# Patient Record
Sex: Female | Born: 1945
Health system: Southern US, Community
[De-identification: ages and names within clinical notes are randomized; demographics above are authoritative.]

## PROBLEM LIST (undated history)

## (undated) DIAGNOSIS — J189 Pneumonia, unspecified organism: Secondary | ICD-10-CM

## (undated) DIAGNOSIS — Z85828 Personal history of other malignant neoplasm of skin: Secondary | ICD-10-CM

## (undated) DIAGNOSIS — R7303 Prediabetes: Secondary | ICD-10-CM

## (undated) DIAGNOSIS — I499 Cardiac arrhythmia, unspecified: Secondary | ICD-10-CM

## (undated) DIAGNOSIS — N189 Chronic kidney disease, unspecified: Secondary | ICD-10-CM

## (undated) DIAGNOSIS — M1990 Unspecified osteoarthritis, unspecified site: Secondary | ICD-10-CM

## (undated) DIAGNOSIS — F329 Major depressive disorder, single episode, unspecified: Secondary | ICD-10-CM

## (undated) DIAGNOSIS — R21 Rash and other nonspecific skin eruption: Secondary | ICD-10-CM

## (undated) DIAGNOSIS — G4730 Sleep apnea, unspecified: Secondary | ICD-10-CM

## (undated) DIAGNOSIS — F419 Anxiety disorder, unspecified: Secondary | ICD-10-CM

## (undated) DIAGNOSIS — F32A Depression, unspecified: Secondary | ICD-10-CM

## (undated) DIAGNOSIS — I10 Essential (primary) hypertension: Secondary | ICD-10-CM

## (undated) DIAGNOSIS — Z9289 Personal history of other medical treatment: Secondary | ICD-10-CM

---

## 1998-09-17 ENCOUNTER — Other Ambulatory Visit: Admission: RE | Admit: 1998-09-17 | Discharge: 1998-09-17 | Payer: Self-pay | Admitting: Obstetrics and Gynecology

## 1998-11-30 ENCOUNTER — Ambulatory Visit: Admission: RE | Admit: 1998-11-30 | Discharge: 1998-11-30 | Payer: Self-pay | Admitting: Family Medicine

## 1999-02-06 ENCOUNTER — Ambulatory Visit: Admission: RE | Admit: 1999-02-06 | Discharge: 1999-02-06 | Payer: Self-pay | Admitting: Family Medicine

## 1999-09-17 ENCOUNTER — Ambulatory Visit (HOSPITAL_COMMUNITY): Admission: RE | Admit: 1999-09-17 | Discharge: 1999-09-17 | Payer: Self-pay | Admitting: Obstetrics and Gynecology

## 1999-09-17 ENCOUNTER — Encounter: Payer: Self-pay | Admitting: Obstetrics and Gynecology

## 1999-09-20 ENCOUNTER — Ambulatory Visit (HOSPITAL_COMMUNITY): Admission: RE | Admit: 1999-09-20 | Discharge: 1999-09-20 | Payer: Self-pay | Admitting: *Deleted

## 1999-09-20 ENCOUNTER — Encounter: Payer: Self-pay | Admitting: *Deleted

## 1999-10-22 ENCOUNTER — Other Ambulatory Visit: Admission: RE | Admit: 1999-10-22 | Discharge: 1999-10-22 | Payer: Self-pay | Admitting: Obstetrics and Gynecology

## 2000-10-22 ENCOUNTER — Ambulatory Visit (HOSPITAL_COMMUNITY): Admission: RE | Admit: 2000-10-22 | Discharge: 2000-10-22 | Payer: Self-pay | Admitting: Obstetrics and Gynecology

## 2000-10-22 ENCOUNTER — Encounter: Payer: Self-pay | Admitting: Obstetrics and Gynecology

## 2000-10-29 ENCOUNTER — Other Ambulatory Visit: Admission: RE | Admit: 2000-10-29 | Discharge: 2000-10-29 | Payer: Self-pay | Admitting: Obstetrics and Gynecology

## 2000-10-29 ENCOUNTER — Encounter (INDEPENDENT_AMBULATORY_CARE_PROVIDER_SITE_OTHER): Payer: Self-pay

## 2001-02-03 ENCOUNTER — Encounter (INDEPENDENT_AMBULATORY_CARE_PROVIDER_SITE_OTHER): Payer: Self-pay | Admitting: Specialist

## 2001-02-03 ENCOUNTER — Ambulatory Visit (HOSPITAL_COMMUNITY): Admission: RE | Admit: 2001-02-03 | Discharge: 2001-02-03 | Payer: Self-pay | Admitting: Obstetrics and Gynecology

## 2001-03-16 ENCOUNTER — Encounter: Payer: Self-pay | Admitting: Family Medicine

## 2001-03-16 ENCOUNTER — Ambulatory Visit (HOSPITAL_COMMUNITY): Admission: RE | Admit: 2001-03-16 | Discharge: 2001-03-16 | Payer: Self-pay | Admitting: Family Medicine

## 2001-03-29 ENCOUNTER — Ambulatory Visit (HOSPITAL_COMMUNITY): Admission: RE | Admit: 2001-03-29 | Discharge: 2001-03-29 | Payer: Self-pay | Admitting: Gastroenterology

## 2001-04-01 ENCOUNTER — Encounter: Payer: Self-pay | Admitting: Obstetrics and Gynecology

## 2001-04-01 ENCOUNTER — Ambulatory Visit (HOSPITAL_COMMUNITY): Admission: RE | Admit: 2001-04-01 | Discharge: 2001-04-01 | Payer: Self-pay | Admitting: Obstetrics and Gynecology

## 2001-10-16 ENCOUNTER — Encounter: Payer: Self-pay | Admitting: Internal Medicine

## 2001-10-16 ENCOUNTER — Emergency Department (HOSPITAL_COMMUNITY): Admission: EM | Admit: 2001-10-16 | Discharge: 2001-10-16 | Payer: Self-pay | Admitting: Emergency Medicine

## 2002-01-05 ENCOUNTER — Inpatient Hospital Stay (HOSPITAL_COMMUNITY): Admission: EM | Admit: 2002-01-05 | Discharge: 2002-01-07 | Payer: Self-pay

## 2002-01-06 ENCOUNTER — Encounter: Payer: Self-pay | Admitting: Urology

## 2002-01-11 ENCOUNTER — Encounter: Payer: Self-pay | Admitting: Urology

## 2002-01-11 ENCOUNTER — Ambulatory Visit (HOSPITAL_COMMUNITY): Admission: RE | Admit: 2002-01-11 | Discharge: 2002-01-11 | Payer: Self-pay | Admitting: Urology

## 2002-02-07 ENCOUNTER — Encounter: Payer: Self-pay | Admitting: Obstetrics and Gynecology

## 2002-02-07 ENCOUNTER — Encounter: Admission: RE | Admit: 2002-02-07 | Discharge: 2002-02-07 | Payer: Self-pay | Admitting: Obstetrics and Gynecology

## 2002-03-17 ENCOUNTER — Encounter: Payer: Self-pay | Admitting: Urology

## 2002-03-17 ENCOUNTER — Ambulatory Visit (HOSPITAL_BASED_OUTPATIENT_CLINIC_OR_DEPARTMENT_OTHER): Admission: RE | Admit: 2002-03-17 | Discharge: 2002-03-17 | Payer: Self-pay | Admitting: Urology

## 2002-03-28 ENCOUNTER — Ambulatory Visit (HOSPITAL_BASED_OUTPATIENT_CLINIC_OR_DEPARTMENT_OTHER): Admission: RE | Admit: 2002-03-28 | Discharge: 2002-03-28 | Payer: Self-pay | Admitting: Urology

## 2003-01-02 ENCOUNTER — Encounter: Payer: Self-pay | Admitting: Urology

## 2003-01-02 ENCOUNTER — Ambulatory Visit (HOSPITAL_BASED_OUTPATIENT_CLINIC_OR_DEPARTMENT_OTHER): Admission: RE | Admit: 2003-01-02 | Discharge: 2003-01-02 | Payer: Self-pay | Admitting: Urology

## 2004-02-05 ENCOUNTER — Ambulatory Visit (HOSPITAL_COMMUNITY): Admission: RE | Admit: 2004-02-05 | Discharge: 2004-02-05 | Payer: Self-pay | Admitting: Obstetrics and Gynecology

## 2004-02-08 ENCOUNTER — Other Ambulatory Visit: Admission: RE | Admit: 2004-02-08 | Discharge: 2004-02-08 | Payer: Self-pay | Admitting: Obstetrics and Gynecology

## 2004-08-25 DIAGNOSIS — I499 Cardiac arrhythmia, unspecified: Secondary | ICD-10-CM

## 2004-08-25 HISTORY — DX: Cardiac arrhythmia, unspecified: I49.9

## 2005-03-04 ENCOUNTER — Other Ambulatory Visit: Admission: RE | Admit: 2005-03-04 | Discharge: 2005-03-04 | Payer: Self-pay | Admitting: Obstetrics and Gynecology

## 2005-03-07 ENCOUNTER — Emergency Department (HOSPITAL_COMMUNITY): Admission: EM | Admit: 2005-03-07 | Discharge: 2005-03-07 | Payer: Self-pay | Admitting: Emergency Medicine

## 2005-03-17 ENCOUNTER — Ambulatory Visit (HOSPITAL_COMMUNITY): Admission: RE | Admit: 2005-03-17 | Discharge: 2005-03-17 | Payer: Self-pay | Admitting: Obstetrics and Gynecology

## 2006-03-25 ENCOUNTER — Other Ambulatory Visit: Admission: RE | Admit: 2006-03-25 | Discharge: 2006-03-25 | Payer: Self-pay | Admitting: Obstetrics and Gynecology

## 2007-04-21 ENCOUNTER — Ambulatory Visit (HOSPITAL_COMMUNITY): Admission: RE | Admit: 2007-04-21 | Discharge: 2007-04-21 | Payer: Self-pay | Admitting: Obstetrics and Gynecology

## 2007-04-30 ENCOUNTER — Other Ambulatory Visit: Admission: RE | Admit: 2007-04-30 | Discharge: 2007-04-30 | Payer: Self-pay | Admitting: Obstetrics and Gynecology

## 2008-05-03 ENCOUNTER — Other Ambulatory Visit: Admission: RE | Admit: 2008-05-03 | Discharge: 2008-05-03 | Payer: Self-pay | Admitting: Obstetrics and Gynecology

## 2009-05-23 ENCOUNTER — Other Ambulatory Visit: Admission: RE | Admit: 2009-05-23 | Discharge: 2009-05-23 | Payer: Self-pay | Admitting: Obstetrics and Gynecology

## 2009-05-23 ENCOUNTER — Ambulatory Visit (HOSPITAL_COMMUNITY): Admission: RE | Admit: 2009-05-23 | Discharge: 2009-05-23 | Payer: Self-pay | Admitting: Obstetrics and Gynecology

## 2010-06-25 ENCOUNTER — Ambulatory Visit (HOSPITAL_COMMUNITY): Admission: RE | Admit: 2010-06-25 | Discharge: 2010-06-25 | Payer: Self-pay | Admitting: Obstetrics and Gynecology

## 2011-01-10 NOTE — Op Note (Signed)
[facility]  Patient:    Shannon Aguilar, Shannon Aguilar                     MRN: [id number] Adm. Date:  [id number] Attending:  Madelyn Flavors [facility] [address], [place]   Operative Report  PREOPERATIVE DIAGNOSES:       1. Menorrhagia (626.0).                               2. Endometrial polyp (621.0).  POSTOPERATIVE DIAGNOSES:      1. Menorrhagia (626.0).                               2. Endometrial polyp (621.0).                               3. Small uterine septum.  PROCEDURE:                    Dilatation and curettage, hysteroscopy.  SURGEON:                      Beather Arbour. Thomasena Edis, M.D.  ANESTHESIA:                   Monitored anesthesia care plus 10 cc of 1% lidocaine paracervical block.  ESTIMATED BLOOD LOSS:         Minimal.  FLUIDS:                       Approximately 1000 cc of crystalloid.  DRAINS:                       None.  COMPLICATIONS:                None.  FINDINGS:                     Small uterine septum noted at the fundal portion in the midline.  DESCRIPTION OF OPERATION:     The patient was brought to the operating room and identified on the operating room table.  After the patient was adequately sedated, she was placed in the dorsal lithotomy position, prepped and draped in the usual sterile fashion.  The bladder was straight catheterized for approximately 100 cc of clear yellow urine.  Examination under anesthesia revealed the uterus to be approximately eight weeks in size and anteverted without any adnexal mass palpated.  A speculum was placed.  the anterior lip of the cervix was infiltrated with 1 cc of 1% lidocaine and grasped with a single-tooth tenaculum.  The remaining 9 cc of 1% lidocaine was placed for paracervical block.  The cervix was noted to be scarred due to its parous nature.  The cervix was very carefully and gently dilated up to a #25 Pratt dilator.  Dilatation proceeded  smoothly and easily, but was don very gently to decrease the risk of uterine perforation.  Using Sorbitol as a distending medium, the [facility] hysteroscope was placed.  a very careful and thorough hysteroscopic examination was performed.  The uterus has previously sounded to approximately 7 cm.  There was noted to be polypoid tissue.  There was  also noted to be a uterine septum at the fundus which was small in nature. However, it did seem to emanate equidistant between both borders of the uterus.  After careful and thorough hysteroscopic examination was performed, curettage was performed in a systematic clockwise fashion.  The small curet was used to gently curet the uterus.  I was able to curet around the septum, both left and right.  Great care was taken to decrease the risk of uterine perforation.  Due to the septum, I was unable to place the hysteroscope such that the tubal ostia could be visualized.  Again, this was done very carefully to decrease the risk of uterine perforation.  The Randall stone forceps were placed and additional tissue was obtained.  The hysteroscope was placed and the entire uterus was noted to be well curetted.  At that point, the procedure was terminated.  The patient tolerated the procedure well without apparent complications and was transferred to the recovery room in stable condition after sponge, needle and instrument counts were correct.  It should be noted that there was no bleeding from the tenaculum site.  The patient received the postoperative instruction sheet and urged to return to the office in two weeks for postoperative examination.  She was urged to make no financial or major decisions for the next 24 hours.  She was instructed that she could use ibuprofen 600 mg q.6h. p.r.n. pain.  She is to call for any problems, fever or heavy bleeding. DD:  02/03/01 TD:  02/03/01 Job: [id number]

## 2011-01-10 NOTE — H&P (Signed)
[facility]  Patient:    Shannon Aguilar, Shannon Aguilar Visit Number: [id number] MRN: [id number]          Service Type: MED Location: 3W 0380 01 Attending Physician:  Lauree Chandler Dictated by:   Maretta Bees. Vonita Moss, M.D. Admit Date:  01/05/2002                           History and Physical  HISTORY OF PRESENT ILLNESS:  This 65 year old white female had the onset yesterday afternoon of severe right flank pain.  She was seen in the emergency room, and a CT scan showed a right renal calculus that was nonobstructing, but right hydronephrosis and a 5 x 7 calcification in the vicinity of the distal ureter and bladder but not definitely within either entity.  Because of the severe of the pain despite narcotics I was consulted, and she was admitted to the hospital.  She had a stone several years ago.  She has never had surgery for stones.  She has had history of recurrent UTIs but is not currently having any voiding dysfunction.  PAST MEDICAL HISTORY: 1. Mild hypertension. 2. Sleep apnea. 3. Arthritis. 4. Depression.  PAST SURGICAL HISTORY:  Two C-sections.  MEDICATIONS: 1. Zoloft 100 mg a day. 2. Multivitamin. 3. Allegra for allergies. 4. She thinks her blood pressure pill is triamterene.  ALLERGIES:  Denied.  SOCIAL HISTORY:  She does not smoke, does not drink alcohol.  FAMILY HISTORY:  Noncontributory.  REVIEW OF SYSTEMS:  Noted on health history form.  PHYSICAL EXAMINATION:  VITAL SIGNS:  Blood pressure 160/77, pulse 84, respiratory rate 16.  GENERAL:  Alert and oriented but in distress.  SKIN:  Warm and dry.  NECK:  Supple.  LUNGS:  No respiratory distress.  HEART:  Heart tones regular.  ABDOMEN:  Soft and nontender.  IMPRESSION: 1. Right hydronephrosis and right renal calculus and question of a distal    right ureteral calculus. 2. Hypertension. 3. Depression. 4. Arthritis.  PLAN:  Because of continued pain and question of  whether it is obstructing her ureter, will go ahead today with cystoscopy and retrograde pyelogram, ureteroscopy, etc. Dictated by:   Maretta Bees. Vonita Moss, M.D. Attending Physician:  Lauree Chandler DD:  01/06/02 TD:  01/07/02 Job: [id number]

## 2011-01-10 NOTE — Procedures (Signed)
[facility]  Patient:    RAYLAN, TROIANI                       MRN: [id number] Proc. Date: 03/29/01 Attending:  Verlin Grills, M.D. CC:         Meredith Staggers, M.D.  Beather Arbour Thomasena Edis, M.D.   Procedure Report  PROCEDURE:  Surveillance colonoscopy.  PROCEDURE INDICATION:  Ms. Faithann Natal (date of birth [birth year]) is a 65 year old female who is due for her first surveillance colonoscopy with polypectomy to prevent colon cancer.  ENDOSCOPIST:  Verlin Grills, M.D.  PREMEDICATION:  Versed 7 mg, Demerol 50 mg.  ENDOSCOPE:  Olympus pediatric colonoscope.  DESCRIPTION OF PROCEDURE:  After obtaining informed consent, Ms. Cranfield was placed in the left lateral decubitus position.  I administered intravenous Demerol and intravenous Versed to achieve conscious sedation for the procedure.  The patients blood pressure, oxygen saturations, and cardiac rhythm were monitored throughout the procedure and documented in the medical record.  Anal inspection was normal.  Digital rectal exam was normal.  The Olympus pediatric video colonoscope was introduced into the rectum and advanced to the cecum.  Colonic preparation for the exam today was satisfactory.  RECTUM:  Normal.  SIGMOID COLON AND DESCENDING COLON:  Normal.  SPLENIC FLEXURE:  Normal.  TRANSVERSE COLON:  Normal.  HEPATIC FLEXURE:  Normal.  ASCENDING COLON:  Normal.  CECUM AND ILEOCECAL VALVE:  Normal.  ASSESSMENT:  Normal proctocolonoscopy to the cecum. DD:  03/29/01 TD:  03/29/01 Job: [id number]

## 2011-01-10 NOTE — Op Note (Signed)
[facility]  Patient:    Shannon Aguilar, Shannon Aguilar Visit Number: [id number] MRN: [id number]          Service Type: MED Location: 3W 0380 01 Attending Physician:  Lauree Chandler Dictated by:   Maretta Bees. Vonita Moss, M.D. Proc. Date: 01/06/02 Admit Date:  01/05/2002 Discharge Date: 01/07/2002                             Operative Report  PREOPERATIVE DIAGNOSIS:  Distal right ureteral calculus.  POSTOPERATIVE DIAGNOSIS:  Distal right ureteral calculus.  PROCEDURE: 1. Cystoscopy. 2. Right retrograde pyelogram with interpretation. 3. Right ureteroscopy and stone-basketing. 4. Instructed of right double-J catheter.  SURGEON:  Maretta Bees. Vonita Moss, M.D.  ANESTHESIA:  General.  INDICATIONS:  This 65 year old lady has had stone in the past and was admitted at this time with severe right flank pain.  It has persisted overnight.  The CT scan showed bilateral nonobstructing renal calculi and right hydronephrosis, and a 6 mm calcification near the distal ureter and bladder but questionably outside those structures.  It was a persistent pain and obstruction on CT.  She is brought to the OR today.  DESCRIPTION OF PROCEDURE:  The patient was brought to the operating room and placed in lithotomy position.  External genitalia were prepped and draped in the usual fashion.  She was cystoscoped, and the right intramural ureters appeared somewhat bulging compared to the left.  There were no stones or other lesions in the bladder.  I then tried to pass a guidewire up the right ureter, but it would not go beyond the intramural ureter.  I then injected contrast, and it revealed the dilated ureter right down to the outside of the bladder. With an open-ended catheter inside the orifice, I was able to get up the Glidewire into the ureter and kidney and then replaced it by means of an open-ended ureteral catheter with a metal guidewire.  I then inserted the 6 French ureteroscope  in the intramural ureter and just as it exited the bladder, there was a strictured area that I was able to dilate.  Just beyond this area was a small, crumbly, yellow stone that broke up into tiny, small fragments with the stone basket.  The stone was barely just 2-3 mm in size.  I then scoped the distal ureter, and there were no residual stone fragments. There was no significant trauma to the distal ureter.  Reinjection of contrast revealed no extravasation.  With the guidewire in place, I inserted a 6 French 26 cm double-J catheter that had a full coil in the bladder and full coil in the kidney.  The string was brought out per urethra.  I left the double-J catheter in at this time because she needs to travel out of town for a childs graduation, and I wanted her to have obstruction relieved during that time. The bladder was emptied, the cystoscope removed, and the patient sent to the recovery room in good condition. Dictated by:   Maretta Bees. Vonita Moss, M.D. Attending Physician:  Lauree Chandler DD:  01/06/02 TD:  01/08/02 Job: [id number]

## 2011-01-10 NOTE — H&P (Signed)
[facility]  Patient:    Shannon Aguilar, Shannon Aguilar                       MRN: [id number] Adm. Date:  02/03/01 Attending:  Beather Arbour. Thomasena Edis, M.D.                         History and Physical  DATE OF BIRTH:                1945-10-10  HISTORY OF PRESENT ILLNESS:   Patient is a 65 year old Caucasian female who recently was found to have significant menorrhagia.  She was given Provera to treat her bleeding.  She subsequently underwent an ultrasound which showed an endometrial stripe which was thickened, measuring up to 8.4 mm.  An endometrial biopsy was performed and noted to be secretory but with partial hemorrhagic infarction of a polyp.  Patient is admitted for a D&C/hysteroscopy to rule out additional polyp and to rule out any coexisting endometrial hyperplasia.  Patient understands this is highly unlikely in view of secretory endometrium, however, this needs to be performed due to the history of the thickened endometrium.  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  Risks of surgery including anesthetic complications, hemorrhage or infection, damage to adjacent structures including bladder, bowel, blood vessels and ureters were discussed with patient.  She was made aware of uterine perforation which could result in overwhelming life-threatening hemorrhage requiring emergent hysterectomy and uterine perforation which could result in bowel damage requiring emergent colostomy or which would result in overwhelming life-threatening peritonitis.  She expresses understanding of and acceptance of these risks and desires to proceed.  PAST MEDICAL HISTORY:         Hypertension, seasonal allergies, anxiety, history of kidney stones in May of 1980 and pericarditis, September of 1990.  ALLERGIES:                    No known drug allergies.  SOCIAL HISTORY:                The patient is a Runner, broadcasting/film/video at [employer].  REVIEW OF SYSTEMS:            Negative.  FAMILY HISTORY:               The patients father died of heart disease, diabetes and prostate cancer.  The patients mother has hypertension and arthritis.  She has two brothers, ages 56 and 59, both with hypertension.  She has one sister, age 66, with hypothyroidism and hypercholesterolemia.  Her two children have no medical problems.  MEDICATIONS:                  1. Allegra one daily.                               2. Triamterene one-half tablet daily for blood                                  pressure.  3. Zoloft 100 mg daily for anxiety.  PHYSICAL EXAMINATION:  VITAL SIGNS:                  Height 65 inches.  Weight 205 pounds.  Blood pressure 126/84.  HEENT:                        Normal.  NECK:                         Supple without thyromegaly or adenopathy.  LUNGS:                        Clear to auscultation.  CARDIAC:                      Regular rate and rhythm.  ABDOMEN:                      Soft, nontender.  No hepatosplenomegaly, obese.  PELVIC:                       BUS normal.  Cervix, vagina and vulva without visible lesions.  Bimanual examination reveals the uterus to be nontender, approximately 10 weeks size, without any adnexal mass palpated.  IMPRESSION AND PLAN:          The patient is a 65 year old gravida 2, para 2 Caucasian female with menorrhagia, well-controlled on Provera at the time of the bleeding.  She is admitted for a dilatation and curettage and hysteroscopy to rule out any additional polyp after endometrial biopsy showed degenerating secretory endometrium and a benign endometrial polyp with partial hemorrhagic infarction.  Risks have been explained to the patient.  She expresses understanding of and acceptance of these risks. DD:  02/03/01 TD:  02/03/01 Job: [id number]

## 2011-10-29 ENCOUNTER — Other Ambulatory Visit (HOSPITAL_COMMUNITY): Payer: Self-pay | Admitting: Obstetrics and Gynecology

## 2011-10-29 DIAGNOSIS — Z1231 Encounter for screening mammogram for malignant neoplasm of breast: Secondary | ICD-10-CM

## 2011-11-21 ENCOUNTER — Ambulatory Visit (HOSPITAL_COMMUNITY)
Admission: RE | Admit: 2011-11-21 | Discharge: 2011-11-21 | Disposition: A | Discharge status: Home / Self Care | Payer: Medicare Other | Source: Ambulatory Visit | Attending: Obstetrics and Gynecology | Admitting: Obstetrics and Gynecology

## 2011-11-21 DIAGNOSIS — Z1231 Encounter for screening mammogram for malignant neoplasm of breast: Secondary | ICD-10-CM | POA: Insufficient documentation

## 2012-02-20 ENCOUNTER — Encounter (HOSPITAL_COMMUNITY): Payer: Self-pay | Admitting: Pharmacy Technician

## 2012-02-20 ENCOUNTER — Other Ambulatory Visit: Payer: Self-pay | Admitting: Orthopedic Surgery

## 2012-02-20 NOTE — Progress Notes (Signed)
Preoperative surgical orders have been place into the Epic hospital system for Shannon Aguilar on 02/20/2012, 2:57 PM  by Patrica Duel for surgery on 03/03/2012.  Preop Knee orders including  IV Tylenol, and IV Decadron as long as there are no contraindications to the above medications. Avel Peace, PA-C

## 2012-02-24 ENCOUNTER — Encounter (HOSPITAL_COMMUNITY): Payer: Self-pay | Admitting: Pharmacy Technician

## 2012-02-25 ENCOUNTER — Encounter (HOSPITAL_COMMUNITY): Payer: Self-pay

## 2012-02-25 ENCOUNTER — Ambulatory Visit (HOSPITAL_COMMUNITY)
Admission: RE | Admit: 2012-02-25 | Discharge: 2012-02-25 | Disposition: A | Discharge status: Home / Self Care | Payer: Medicare Other | Source: Ambulatory Visit | Attending: Orthopedic Surgery | Admitting: Orthopedic Surgery

## 2012-02-25 ENCOUNTER — Encounter (HOSPITAL_COMMUNITY)
Admission: RE | Admit: 2012-02-25 | Discharge: 2012-02-25 | Disposition: A | Discharge status: Home / Self Care | Payer: Medicare Other | Source: Ambulatory Visit | Attending: Orthopedic Surgery | Admitting: Orthopedic Surgery

## 2012-02-25 DIAGNOSIS — Z01812 Encounter for preprocedural laboratory examination: Secondary | ICD-10-CM | POA: Insufficient documentation

## 2012-02-25 DIAGNOSIS — Z01810 Encounter for preprocedural cardiovascular examination: Secondary | ICD-10-CM | POA: Insufficient documentation

## 2012-02-25 DIAGNOSIS — I517 Cardiomegaly: Secondary | ICD-10-CM | POA: Insufficient documentation

## 2012-02-25 DIAGNOSIS — N189 Chronic kidney disease, unspecified: Secondary | ICD-10-CM

## 2012-02-25 DIAGNOSIS — X58XXXA Exposure to other specified factors, initial encounter: Secondary | ICD-10-CM | POA: Insufficient documentation

## 2012-02-25 DIAGNOSIS — IMO0002 Reserved for concepts with insufficient information to code with codable children: Secondary | ICD-10-CM | POA: Insufficient documentation

## 2012-02-25 DIAGNOSIS — M1990 Unspecified osteoarthritis, unspecified site: Secondary | ICD-10-CM

## 2012-02-25 DIAGNOSIS — G4730 Sleep apnea, unspecified: Secondary | ICD-10-CM

## 2012-02-25 HISTORY — DX: Sleep apnea, unspecified: G47.30

## 2012-02-25 HISTORY — DX: Depression, unspecified: F32.A

## 2012-02-25 HISTORY — PX: DILATION AND CURETTAGE OF UTERUS: SHX78

## 2012-02-25 HISTORY — PX: TONSILLECTOMY: SUR1361

## 2012-02-25 HISTORY — DX: Major depressive disorder, single episode, unspecified: F32.9

## 2012-02-25 HISTORY — DX: Chronic kidney disease, unspecified: N18.9

## 2012-02-25 HISTORY — DX: Unspecified osteoarthritis, unspecified site: M19.90

## 2012-02-25 HISTORY — DX: Cardiac arrhythmia, unspecified: I49.9

## 2012-02-25 HISTORY — DX: Essential (primary) hypertension: I10

## 2012-02-25 HISTORY — PX: LITHOTRIPSY: SUR834

## 2012-02-25 LAB — CBC
HCT: 41.3 % (ref 36.0–46.0)
Hemoglobin: 13.5 g/dL (ref 12.0–15.0)
Platelets: 281 10*3/uL (ref 150–400)
RBC: 4.57 MIL/uL (ref 3.87–5.11)

## 2012-02-25 LAB — BASIC METABOLIC PANEL
CO2: 27 mEq/L (ref 19–32)
Chloride: 101 mEq/L (ref 96–112)
Creatinine, Ser: 0.91 mg/dL (ref 0.50–1.10)
GFR calc non Af Amer: 64 mL/min — ABNORMAL LOW (ref >90)
Glucose, Bld: 89 mg/dL (ref 70–99)
Sodium: 138 mEq/L (ref 135–145)

## 2012-02-25 NOTE — Patient Instructions (Addendum)
20 Shannon Aguilar  02/25/2012   Your procedure is scheduled on: 7-10  -2013  Report to [facility] at       0945 AM.  Call this number if you have problems the morning of surgery: [phone]   Remember:   Do not eat food:After Midnight.    Take these medicines the morning of surgery with A SIP OF WATER: no meds-take usual PM meds. Bring CPAP mask.   Do not wear jewelry, make-up or nail polish.  Do not wear lotions, powders, or perfumes. You may wear deodorant.  Do not shave 48 hours prior to surgery.(face and neck okay, no shaving of legs)  Do not bring valuables to the hospital.  Contacts, dentures or bridgework may not be worn into surgery.  Leave suitcase in the car. After surgery it may be brought to your room.  For patients admitted to the hospital, checkout time is 11:00 AM the day of discharge.   Patients discharged the day of surgery will not be allowed to drive home.  Name and phone number of your driver: will arrange  Special Instructions: CHG Shower Use Special Wash: 1/2 bottle night before surgery and 1/2 bottle morning of surgery.(avoid face and genitals)   Please read over the following fact sheets that you were given: MRSA Information, Incentive Spirometry Instruction.

## 2012-02-25 NOTE — Pre-Procedure Instructions (Addendum)
02-25-12 EKG/CXR done today. Sleep STudy 6'00/ Stress Test 7'06/ ECHO 7'06/ LOV -Dr. Eldridge Dace (03-27-09)/ LOV- Dr. Marykay Lex)- reports with chart. 02-25-12 1705 C, BMP results faxed to Dr. Deri Fuelling office. W. Kennon Portela

## 2012-03-03 ENCOUNTER — Encounter (HOSPITAL_COMMUNITY)
Admission: RE | Disposition: A | Discharge status: Home / Self Care | Payer: Self-pay | Source: Ambulatory Visit | Attending: Orthopedic Surgery

## 2012-03-03 ENCOUNTER — Encounter (HOSPITAL_COMMUNITY): Payer: Self-pay | Admitting: *Deleted

## 2012-03-03 ENCOUNTER — Encounter (HOSPITAL_COMMUNITY): Payer: Self-pay | Admitting: Anesthesiology

## 2012-03-03 ENCOUNTER — Ambulatory Visit (HOSPITAL_COMMUNITY)
Admission: RE | Admit: 2012-03-03 | Discharge: 2012-03-03 | Disposition: A | Discharge status: Home / Self Care | Payer: Medicare Other | Source: Ambulatory Visit | Attending: Orthopedic Surgery | Admitting: Orthopedic Surgery

## 2012-03-03 ENCOUNTER — Ambulatory Visit (HOSPITAL_COMMUNITY): Payer: Medicare Other | Admitting: Anesthesiology

## 2012-03-03 DIAGNOSIS — K219 Gastro-esophageal reflux disease without esophagitis: Secondary | ICD-10-CM | POA: Insufficient documentation

## 2012-03-03 DIAGNOSIS — Y9229 Other specified public building as the place of occurrence of the external cause: Secondary | ICD-10-CM | POA: Insufficient documentation

## 2012-03-03 DIAGNOSIS — M2240 Chondromalacia patellae, unspecified knee: Secondary | ICD-10-CM | POA: Insufficient documentation

## 2012-03-03 DIAGNOSIS — Z7982 Long term (current) use of aspirin: Secondary | ICD-10-CM | POA: Insufficient documentation

## 2012-03-03 DIAGNOSIS — X500XXA Overexertion from strenuous movement or load, initial encounter: Secondary | ICD-10-CM | POA: Insufficient documentation

## 2012-03-03 DIAGNOSIS — G4730 Sleep apnea, unspecified: Secondary | ICD-10-CM | POA: Insufficient documentation

## 2012-03-03 DIAGNOSIS — I10 Essential (primary) hypertension: Secondary | ICD-10-CM | POA: Insufficient documentation

## 2012-03-03 DIAGNOSIS — M2390 Unspecified internal derangement of unspecified knee: Secondary | ICD-10-CM | POA: Insufficient documentation

## 2012-03-03 DIAGNOSIS — S83249A Other tear of medial meniscus, current injury, unspecified knee, initial encounter: Secondary | ICD-10-CM | POA: Diagnosis present

## 2012-03-03 DIAGNOSIS — IMO0002 Reserved for concepts with insufficient information to code with codable children: Secondary | ICD-10-CM | POA: Insufficient documentation

## 2012-03-03 DIAGNOSIS — Z79899 Other long term (current) drug therapy: Secondary | ICD-10-CM | POA: Insufficient documentation

## 2012-03-03 HISTORY — PX: KNEE ARTHROSCOPY: SHX127

## 2012-03-03 SURGERY — ARTHROSCOPY, KNEE
Anesthesia: General | Site: Knee | Laterality: Right | Wound class: Clean

## 2012-03-03 MED ORDER — VANCOMYCIN HCL IN DEXTROSE 1-5 GM/200ML-% IV SOLN
INTRAVENOUS | Status: AC
  Filled 2012-03-03: qty 200

## 2012-03-03 MED ORDER — LACTATED RINGERS IR SOLN
Status: DC | PRN
  Administered 2012-03-03: 9000 mL

## 2012-03-03 MED ORDER — PROPOFOL 10 MG/ML IV BOLUS
INTRAVENOUS | Status: DC | PRN
  Administered 2012-03-03: 200 mg via INTRAVENOUS

## 2012-03-03 MED ORDER — KETOROLAC TROMETHAMINE 15 MG/ML IJ SOLN
INTRAMUSCULAR | Status: AC
  Filled 2012-03-03: qty 2

## 2012-03-03 MED ORDER — BUPIVACAINE-EPINEPHRINE 0.25% -1:200000 IJ SOLN
INTRAMUSCULAR | Status: DC | PRN
  Administered 2012-03-03: 20 mL

## 2012-03-03 MED ORDER — LIDOCAINE HCL (CARDIAC) 20 MG/ML IV SOLN
INTRAVENOUS | Status: DC | PRN
  Administered 2012-03-03: 50 mg via INTRAVENOUS

## 2012-03-03 MED ORDER — ACETAMINOPHEN 10 MG/ML IV SOLN
1000.0000 mg | Freq: Once | INTRAVENOUS | Status: AC
  Administered 2012-03-03: 1000 mg via INTRAVENOUS
  Filled 2012-03-03: qty 100

## 2012-03-03 MED ORDER — ONDANSETRON HCL 4 MG/2ML IJ SOLN
INTRAMUSCULAR | Status: DC | PRN
  Administered 2012-03-03 (×2): 2 mg via INTRAVENOUS

## 2012-03-03 MED ORDER — FENTANYL CITRATE 0.05 MG/ML IJ SOLN
INTRAMUSCULAR | Status: DC | PRN
  Administered 2012-03-03 (×2): 50 ug via INTRAVENOUS

## 2012-03-03 MED ORDER — KETAMINE HCL 10 MG/ML IJ SOLN
INTRAMUSCULAR | Status: DC | PRN
  Administered 2012-03-03: 10 mg via INTRAVENOUS

## 2012-03-03 MED ORDER — CEFAZOLIN SODIUM-DEXTROSE 2-3 GM-% IV SOLR
INTRAVENOUS | Status: AC
  Filled 2012-03-03: qty 50

## 2012-03-03 MED ORDER — KETOROLAC TROMETHAMINE 30 MG/ML IJ SOLN
15.0000 mg | Freq: Once | INTRAMUSCULAR | Status: AC | PRN
  Administered 2012-03-03: 30 mg via INTRAVENOUS

## 2012-03-03 MED ORDER — BUPIVACAINE-EPINEPHRINE PF 0.25-1:200000 % IJ SOLN
INTRAMUSCULAR | Status: AC
  Filled 2012-03-03: qty 30

## 2012-03-03 MED ORDER — OXYCODONE HCL 5 MG PO TABS
ORAL_TABLET | ORAL | Status: AC
  Administered 2012-03-03: 5 mg
  Filled 2012-03-03: qty 1

## 2012-03-03 MED ORDER — ACETAMINOPHEN 10 MG/ML IV SOLN
INTRAVENOUS | Status: AC
  Filled 2012-03-03: qty 100

## 2012-03-03 MED ORDER — DEXAMETHASONE SODIUM PHOSPHATE 10 MG/ML IJ SOLN
10.0000 mg | Freq: Once | INTRAMUSCULAR | Status: AC
  Administered 2012-03-03: 10 mg via INTRAVENOUS

## 2012-03-03 MED ORDER — HYDROMORPHONE HCL PF 1 MG/ML IJ SOLN
INTRAMUSCULAR | Status: DC | PRN
  Administered 2012-03-03 (×2): 1 mg via INTRAVENOUS

## 2012-03-03 MED ORDER — LACTATED RINGERS IV SOLN
INTRAVENOUS | Status: DC
  Administered 2012-03-03: 14:00:00 via INTRAVENOUS
  Administered 2012-03-03: 1000 mL via INTRAVENOUS

## 2012-03-03 MED ORDER — MIDAZOLAM HCL 5 MG/5ML IJ SOLN
INTRAMUSCULAR | Status: DC | PRN
  Administered 2012-03-03: 2 mg via INTRAVENOUS

## 2012-03-03 MED ORDER — METHOCARBAMOL 500 MG PO TABS: 500.0000 mg | ORAL_TABLET | Freq: Four times a day (QID) | ORAL | Status: AC

## 2012-03-03 MED ORDER — OXYCODONE HCL 5 MG PO TABS: 5.0000 mg | ORAL_TABLET | ORAL | Status: AC | PRN

## 2012-03-03 MED ORDER — PROMETHAZINE HCL 25 MG/ML IJ SOLN: 6.2500 mg | INTRAMUSCULAR | Status: DC | PRN

## 2012-03-03 MED ORDER — CHLORHEXIDINE GLUCONATE 4 % EX LIQD
60.0000 mL | Freq: Once | CUTANEOUS | Status: DC
  Filled 2012-03-03: qty 60

## 2012-03-03 MED ORDER — SODIUM CHLORIDE 0.9 % IV SOLN: INTRAVENOUS | Status: DC

## 2012-03-03 MED ORDER — CEFAZOLIN SODIUM-DEXTROSE 2-3 GM-% IV SOLR
2.0000 g | INTRAVENOUS | Status: AC
  Administered 2012-03-03: 2 g via INTRAVENOUS

## 2012-03-03 MED ORDER — FENTANYL CITRATE 0.05 MG/ML IJ SOLN: 25.0000 ug | INTRAMUSCULAR | Status: DC | PRN

## 2012-03-03 SURGICAL SUPPLY — 27 items
BANDAGE ELASTIC 6 VELCRO ST LF (GAUZE/BANDAGES/DRESSINGS) ×1 IMPLANT
BLADE 4.2CUDA (BLADE) ×2 IMPLANT
CLOTH BEACON ORANGE TIMEOUT ST (SAFETY) ×2 IMPLANT
CUFF TOURN SGL QUICK 34 (TOURNIQUET CUFF) ×2
CUFF TRNQT CYL 34X4X40X1 (TOURNIQUET CUFF) ×1 IMPLANT
DRAPE U-SHAPE 47X51 STRL (DRAPES) ×2 IMPLANT
DRSG ADAPTIC 3X8 NADH LF (GAUZE/BANDAGES/DRESSINGS) ×1 IMPLANT
DRSG EMULSION OIL 3X3 NADH (GAUZE/BANDAGES/DRESSINGS) ×2 IMPLANT
DRSG PAD ABDOMINAL 8X10 ST (GAUZE/BANDAGES/DRESSINGS) ×2 IMPLANT
DURAPREP 26ML APPLICATOR (WOUND CARE) ×2 IMPLANT
GLOVE BIO SURGEON STRL SZ7.5 (GLOVE) ×2 IMPLANT
GLOVE BIO SURGEON STRL SZ8 (GLOVE) ×2 IMPLANT
GLOVE BIOGEL PI IND STRL 8 (GLOVE) ×1 IMPLANT
GLOVE BIOGEL PI INDICATOR 8 (GLOVE) ×1
GOWN STRL NON-REIN LRG LVL3 (GOWN DISPOSABLE) ×2 IMPLANT
MANIFOLD NEPTUNE II (INSTRUMENTS) ×4 IMPLANT
PACK ARTHROSCOPY WL (CUSTOM PROCEDURE TRAY) ×2 IMPLANT
PACK ICE MAXI GEL EZY WRAP (MISCELLANEOUS) ×6 IMPLANT
PADDING CAST COTTON 6X4 STRL (CAST SUPPLIES) ×2 IMPLANT
POSITIONER SURGICAL ARM (MISCELLANEOUS) ×2 IMPLANT
SET ARTHROSCOPY TUBING (MISCELLANEOUS) ×2
SET ARTHROSCOPY TUBING LN (MISCELLANEOUS) ×1 IMPLANT
SPONGE GAUZE 4X4 12PLY (GAUZE/BANDAGES/DRESSINGS) ×1 IMPLANT
SUT ETHILON 4 0 PS 2 18 (SUTURE) ×2 IMPLANT
TOWEL OR 17X26 10 PK STRL BLUE (TOWEL DISPOSABLE) ×2 IMPLANT
WAND 90 DEG TURBOVAC W/CORD (SURGICAL WAND) ×2 IMPLANT
WRAP KNEE MAXI GEL POST OP (GAUZE/BANDAGES/DRESSINGS) ×4 IMPLANT

## 2012-03-03 NOTE — Progress Notes (Signed)
1005 Spoke with Dr. Lequita Halt re; penicillin allergy hives and rash okay to give- Ancef pre op also told that she drank 10 ozs. Of water at 0930 letting anesthiosgist                    1005 Spoke with Dr. Lindon Romp                                                                                                                                                                                                                                                             1005 Spoke with Dr. Lequita Halt re; Penicillin allergy hives and rash okay to give pre op, also told that she drank 10 ozs. Of water at 0930

## 2012-03-03 NOTE — Anesthesia Preprocedure Evaluation (Signed)
Anesthesia Evaluation  Patient identified by MRN, date of birth, ID band Patient awake    Reviewed: Allergy & Precautions, H&P , NPO status , Patient's Chart, lab work & pertinent test results  Airway Mallampati: III TM Distance: <3 FB Neck ROM: Full    Dental No notable dental hx.    Pulmonary neg pulmonary ROS,  breath sounds clear to auscultation  Pulmonary exam normal       Cardiovascular hypertension, Pt. on medications negative cardio ROS  Rhythm:Regular Rate:Normal     Neuro/Psych negative neurological ROS  negative psych ROS   GI/Hepatic negative GI ROS, Neg liver ROS,   Endo/Other  negative endocrine ROS  Renal/GU negative Renal ROS  negative genitourinary   Musculoskeletal negative musculoskeletal ROS (+)   Abdominal   Peds negative pediatric ROS (+)  Hematology negative hematology ROS (+)   Anesthesia Other Findings   Reproductive/Obstetrics negative OB ROS                           Anesthesia Physical Anesthesia Plan  ASA: II  Anesthesia Plan: General   Post-op Pain Management:    Induction: Intravenous  Airway Management Planned: LMA  Additional Equipment:   Intra-op Plan:   Post-operative Plan:   Informed Consent: I have reviewed the patients History and Physical, chart, labs and discussed the procedure including the risks, benefits and alternatives for the proposed anesthesia with the patient or authorized representative who has indicated his/her understanding and acceptance.   Dental advisory given  Plan Discussed with: CRNA  Anesthesia Plan Comments:         Anesthesia Quick Evaluation

## 2012-03-03 NOTE — Anesthesia Postprocedure Evaluation (Signed)
  Anesthesia Post-op Note  Patient: Shannon Aguilar  Procedure(s) Performed: Procedure(s) (LRB): ARTHROSCOPY KNEE (Right)  Patient Location: PACU  Anesthesia Type: General  Level of Consciousness: awake and alert   Airway and Oxygen Therapy: Patient Spontanous Breathing  Post-op Pain: mild  Post-op Assessment: Post-op Vital signs reviewed, Patient's Cardiovascular Status Stable, Respiratory Function Stable, Patent Airway and No signs of Nausea or vomiting  Post-op Vital Signs: stable  Complications: No apparent anesthesia complications

## 2012-03-03 NOTE — Interval H&P Note (Signed)
History and Physical Interval Note:  03/03/2012 11:40 AM  Shannon Aguilar  has presented today for surgery, with the diagnosis of Right Medial Meniscal Tear  The various methods of treatment have been discussed with the patient and family. After consideration of risks, benefits and other options for treatment, the patient has consented to  Procedure(s) (LRB): ARTHROSCOPY KNEE (Right) as a surgical intervention .  The patient's history has been reviewed, patient examined, no change in status, stable for surgery.  I have reviewed the patients' chart and labs.  Questions were answered to the patient's satisfaction.     Loanne Drilling

## 2012-03-03 NOTE — Progress Notes (Signed)
[id number] Spoke with Dr. Council Mechanic re; drinking of 10 ozs. Of water at 0930 okay for surgery

## 2012-03-03 NOTE — H&P (Signed)
CC- Shannon Aguilar is a 66 y.o. female who presents with right knee pain.  HPI- . Knee Pain: Patient presents with knee pain involving the  right knee. Onset of the symptoms was several months ago. Inciting event: [place] while in airport. Current symptoms include giving out and pain located medially. Pain is aggravated by pivoting, rising after sitting, squatting and walking.  Patient has had no prior knee problems. Evaluation to date: MRI: abnormal medial meniscal tear. Treatment to date: corticosteroid injection which was ineffective.  Past Medical History  Diagnosis Date  . Chronic kidney disease 02-25-12    kidney stones- litho's x2  . Hypertension   . Dysrhythmia 02-25-12    hx. A. Fib, no recent problems -rate control-NSR  at present  . Depression   . Sleep apnea 02-25-12    uses cpap since '00  . GERD (gastroesophageal reflux disease) 02-25-12    mild, not a problem  . Arthritis 02-25-12    arthritis-neck, fingers, hips, knees  . Transfusion history 02-25-12    After last C-Section '81    Past Surgical History  Procedure Date  . Cesarean section 02-25-12    x2   . Tonsillectomy 02-25-12    child  . Lithotripsy 02-25-12    x2   . Dilation and curettage of uterus 02-25-12    abnormal vaginal bleeding- ? 5 yrs ago    Prior to Admission medications   Medication Sig Start Date End Date Taking? Authorizing Provider  aspirin 325 MG tablet Take 325 mg by mouth at bedtime.    Historical Provider, MD  calcium citrate-vitamin D 200-200 MG-UNIT TABS Take 1 tablet by mouth daily.    Historical Provider, MD  citalopram (CELEXA) 40 MG tablet Take 40 mg by mouth at bedtime.    Historical Provider, MD  fish oil-omega-3 fatty acids 1000 MG capsule Take 1 g by mouth daily.    Historical Provider, MD  Ginger, Zingiber officinalis, (GINGER PO) Take 1 tablet by mouth 2 (two) times daily.    Historical Provider, MD  Multiple Vitamin (MULTIVITAMIN WITH MINERALS) TABS Take 1 tablet by mouth daily.     Historical Provider, MD  Polyethyl Glycol-Propyl Glycol (SYSTANE OP) Apply 1 drop to eye 2 (two) times daily as needed. Dry eyes    Historical Provider, MD  rosuvastatin (CRESTOR) 10 MG tablet Take 5 mg by mouth at bedtime.    Historical Provider, MD  triamterene-hydrochlorothiazide (DYAZIDE) 37.5-25 MG per capsule Take 2 capsules by mouth every morning.    Historical Provider, MD  TURMERIC PO Take 1 capsule by mouth daily.    Historical Provider, MD  UBIQUINOL PO Take 100 mcg by mouth daily.    Historical Provider, MD   KNEE EXAM antalgic gait, soft tissue tenderness over medial joint line, collateral ligaments intact, negative Lachman sign, normal ipsilateral hip exam  Physical Examination: General appearance - alert, well appearing, and in no distress Mental status - alert, oriented to person, place, and time Chest - clear to auscultation, no wheezes, rales or rhonchi, symmetric air entry Heart - normal rate, regular rhythm, normal S1, S2, no murmurs, rubs, clicks or gallops Abdomen - soft, nontender, nondistended, no masses or organomegaly Neurological - alert, oriented, normal speech, no focal findings or movement disorder noted   Asessment/Plan--- Right knee medial meniscal tear- - Plan Right knee arthroscopy with meniscal debridement. Procedure risks and potential comps discussed with patient who elects to proceed. Goals are decreased pain and increased function with a  high likelihood of achieving both

## 2012-03-03 NOTE — Op Note (Signed)
Preoperative diagnosis-  Right knee medial meniscal tear  Postoperative diagnosis Right- knee medial meniscal tear   Plus Right medial chondral defects  Procedure- Right knee arthroscopy with medial  Meniscal debridement and chondroplasty  Surgeon- Gus Rankin. Ura Hausen, MD  Anesthesia-General  EBL-  minimal Complications- None  Condition- PACU - hemodynamically stable.  Brief clinical note- -Shannon Aguilar is a 66 y.o.  female with a several month history of right knee pain and mechanical symptoms. Exam and history suggested medial meniscal tear confirmed by MRI. The patient presents now for arthroscopy and debridement   Procedure in detail -       After successful administration of General anesthetic, a tourmiquet is placed high on the Right  thigh and the Right lower extremity is prepped and draped in the usual sterile fashion. Time out is performed by the surgical team. Standard superomedial and inferolateral portal sites are marked and incisions made with an 11 blade. The inflow cannula is passed through the superomedial portal and camera through the inferolateral portal and inflow is initiated. Arthroscopic visualization proceeds.      The undersurface of the patella and trochlea are visualized and there is grade II chondromalacia of the patella and trochlea diffusely without any unstable defects. The medial and lateral gutters are visualized and there are  no loose bodies. Flexion and valgus force is applied to the knee and the medial compartment is entered. A spinal needle is passed into the joint through the site marked for the inferomedial portal. A small incision is made and the dilator passed into the joint. The findings for the medial compartment are tear in the posterior horn of the medial meniscus and an approximately 2 x 2 cm chondral de;amination in the medial femoral condyle . The tear is debrided to a stable base with baskets and a shaver and sealed off with the Arthrocare. The  shaver is used to debride the unstable cartilage to a stable bony base with stable edges. The bony defect is then abraded with the shaver. It is probed and found to be stable.    The intercondylar notch is visualized and the ACL appears normal. The lateral compartment is entered and the findings are normal .      The joint is again inspected and there are no other tears, defects or loose bodies identified. The arthroscopic equipment is then removed from the inferior portals which are closed with interrupted 4-0 nylon. 20 ml of .25% Marcaine with epinephrine are injected through the inflow cannula and the cannula is then removed and the portal closed with nylon. The incisions are cleaned and dried and a bulky sterile dressing is applied. The patient is then awakened and transported to recovery in stable condition.   03/03/2012, 12:44 PM

## 2012-03-03 NOTE — Progress Notes (Signed)
1020 Paged Dr. Knox Royalty to inform about patient drinking 10ozs. Of water at 0930 will also let him know that Dr. Lequita Halt is aware of the situation

## 2012-03-03 NOTE — Transfer of Care (Signed)
Immediate Anesthesia Transfer of Care Note  Patient: Shannon Aguilar  Procedure(s) Performed: Procedure(s) (LRB): ARTHROSCOPY KNEE (Right)  Patient Location: PACU  Anesthesia Type: General  Level of Consciousness: sedated  Airway & Oxygen Therapy: Patient Spontanous Breathing and Patient connected to face mask oxygen  Post-op Assessment: Report given to PACU RN and Post -op Vital signs reviewed and stable  Post vital signs: Reviewed and stable  Complications: No apparent anesthesia complications

## 2012-03-04 ENCOUNTER — Encounter (HOSPITAL_COMMUNITY): Payer: Self-pay | Admitting: Orthopedic Surgery

## 2013-02-04 ENCOUNTER — Other Ambulatory Visit: Payer: Self-pay | Admitting: Nephrology

## 2013-02-04 DIAGNOSIS — N183 Chronic kidney disease, stage 3 unspecified: Secondary | ICD-10-CM

## 2013-02-11 ENCOUNTER — Ambulatory Visit
Admission: RE | Admit: 2013-02-11 | Discharge: 2013-02-11 | Disposition: A | Discharge status: Home / Self Care | Payer: Medicare Other | Source: Ambulatory Visit | Attending: Nephrology | Admitting: Nephrology

## 2013-02-11 ENCOUNTER — Other Ambulatory Visit: Payer: Medicare Other

## 2013-02-11 DIAGNOSIS — N183 Chronic kidney disease, stage 3 unspecified: Secondary | ICD-10-CM

## 2013-05-18 ENCOUNTER — Other Ambulatory Visit (HOSPITAL_COMMUNITY): Payer: Self-pay | Admitting: Obstetrics and Gynecology

## 2013-05-18 DIAGNOSIS — Z1231 Encounter for screening mammogram for malignant neoplasm of breast: Secondary | ICD-10-CM

## 2013-05-20 ENCOUNTER — Ambulatory Visit (HOSPITAL_COMMUNITY)
Admission: RE | Admit: 2013-05-20 | Discharge: 2013-05-20 | Disposition: A | Discharge status: Home / Self Care | Payer: Medicare Other | Source: Ambulatory Visit | Attending: Obstetrics and Gynecology | Admitting: Obstetrics and Gynecology

## 2013-05-20 DIAGNOSIS — Z1231 Encounter for screening mammogram for malignant neoplasm of breast: Secondary | ICD-10-CM | POA: Insufficient documentation

## 2014-04-15 ENCOUNTER — Encounter: Payer: Self-pay | Admitting: *Deleted

## 2015-01-09 ENCOUNTER — Other Ambulatory Visit (HOSPITAL_COMMUNITY): Payer: Self-pay | Admitting: Obstetrics and Gynecology

## 2015-01-09 DIAGNOSIS — Z1231 Encounter for screening mammogram for malignant neoplasm of breast: Secondary | ICD-10-CM

## 2015-01-12 ENCOUNTER — Ambulatory Visit (HOSPITAL_COMMUNITY)
Admission: RE | Admit: 2015-01-12 | Discharge: 2015-01-12 | Disposition: A | Discharge status: Home / Self Care | Payer: Medicare PPO | Source: Ambulatory Visit | Attending: Obstetrics and Gynecology | Admitting: Obstetrics and Gynecology

## 2015-01-12 DIAGNOSIS — Z1231 Encounter for screening mammogram for malignant neoplasm of breast: Secondary | ICD-10-CM

## 2015-10-25 ENCOUNTER — Ambulatory Visit: Payer: Self-pay | Admitting: Orthopedic Surgery

## 2015-10-25 NOTE — Progress Notes (Signed)
Preoperative surgical orders have been place into the Epic hospital system for Shannon Aguilar on 10/25/2015, 10:26 AM  by Mickel Crow for surgery on 11-19-15.  Preop Total Knee orders including Experal, IV Tylenol, and IV Decadron as long as there are no contraindications to the above medications. [name], PA-C

## 2015-11-12 ENCOUNTER — Encounter (HOSPITAL_COMMUNITY): Payer: Self-pay

## 2015-11-12 ENCOUNTER — Encounter (HOSPITAL_COMMUNITY)
Admission: RE | Admit: 2015-11-12 | Discharge: 2015-11-12 | Disposition: A | Discharge status: Home / Self Care | Payer: Medicare PPO | Source: Ambulatory Visit | Attending: Orthopedic Surgery | Admitting: Orthopedic Surgery

## 2015-11-12 DIAGNOSIS — Z01818 Encounter for other preprocedural examination: Secondary | ICD-10-CM | POA: Insufficient documentation

## 2015-11-12 DIAGNOSIS — Z01812 Encounter for preprocedural laboratory examination: Secondary | ICD-10-CM | POA: Diagnosis not present

## 2015-11-12 DIAGNOSIS — I10 Essential (primary) hypertension: Secondary | ICD-10-CM | POA: Insufficient documentation

## 2015-11-12 DIAGNOSIS — Z0183 Encounter for blood typing: Secondary | ICD-10-CM | POA: Insufficient documentation

## 2015-11-12 DIAGNOSIS — M1711 Unilateral primary osteoarthritis, right knee: Secondary | ICD-10-CM | POA: Insufficient documentation

## 2015-11-12 HISTORY — DX: Anxiety disorder, unspecified: F41.9

## 2015-11-12 HISTORY — DX: Personal history of other malignant neoplasm of skin: Z85.828

## 2015-11-12 LAB — APTT: aPTT: 28 seconds (ref 24–37)

## 2015-11-12 LAB — URINALYSIS, ROUTINE W REFLEX MICROSCOPIC
Bilirubin Urine: NEGATIVE
GLUCOSE, UA: NEGATIVE mg/dL
HGB URINE DIPSTICK: NEGATIVE
KETONES UR: NEGATIVE mg/dL
Nitrite: NEGATIVE
PH: 6.5 (ref 5.0–8.0)
PROTEIN: NEGATIVE mg/dL
Specific Gravity, Urine: 1.015 (ref 1.005–1.030)

## 2015-11-12 LAB — PROTIME-INR
INR: 0.97 (ref 0.00–1.49)
PROTHROMBIN TIME: 13.1 s (ref 11.6–15.2)

## 2015-11-12 LAB — COMPREHENSIVE METABOLIC PANEL
ALT: 38 U/L (ref 14–54)
ANION GAP: 12 (ref 5–15)
AST: 31 U/L (ref 15–41)
Albumin: 4.3 g/dL (ref 3.5–5.0)
Alkaline Phosphatase: 62 U/L (ref 38–126)
BUN: 29 mg/dL — ABNORMAL HIGH (ref 6–20)
CHLORIDE: 101 mmol/L (ref 101–111)
CO2: 28 mmol/L (ref 22–32)
CREATININE: 0.88 mg/dL (ref 0.44–1.00)
Calcium: 9.2 mg/dL (ref 8.9–10.3)
Glucose, Bld: 75 mg/dL (ref 65–99)
POTASSIUM: 4.1 mmol/L (ref 3.5–5.1)
SODIUM: 141 mmol/L (ref 135–145)
Total Bilirubin: 0.7 mg/dL (ref 0.3–1.2)
Total Protein: 7.2 g/dL (ref 6.5–8.1)

## 2015-11-12 LAB — SURGICAL PCR SCREEN
MRSA, PCR: NEGATIVE
STAPHYLOCOCCUS AUREUS: NEGATIVE

## 2015-11-12 LAB — CBC
HCT: 39.4 % (ref 36.0–46.0)
Hemoglobin: 12.7 g/dL (ref 12.0–15.0)
MCH: 30.1 pg (ref 26.0–34.0)
MCHC: 32.2 g/dL (ref 30.0–36.0)
MCV: 93.4 fL (ref 78.0–100.0)
PLATELETS: 267 10*3/uL (ref 150–400)
RBC: 4.22 MIL/uL (ref 3.87–5.11)
RDW: 13 % (ref 11.5–15.5)
WBC: 7.8 10*3/uL (ref 4.0–10.5)

## 2015-11-12 LAB — URINE MICROSCOPIC-ADD ON

## 2015-11-12 LAB — ABO/RH: ABO/RH(D): A NEG

## 2015-11-12 NOTE — Progress Notes (Signed)
Abnormal UA faxed to Dr.Aluisio 

## 2015-11-12 NOTE — Patient Instructions (Addendum)
YOUR PROCEDURE IS SCHEDULED ON : 11/19/15  REPORT TO [name] MAIN ENTRANCE FOLLOW SIGNS TO EAST ELEVATOR - GO TO 3rd FLOOR CHECK IN AT 3 EAST NURSES STATION (SHORT STAY) AT:  5:15 AM  CALL THIS NUMBER IF YOU HAVE PROBLEMS THE MORNING OF SURGERY [phone]  REMEMBER:ONLY 1 PER PERSON MAY GO TO SHORT STAY WITH YOU TO GET READY THE MORNING OF YOUR SURGERY  DO NOT EAT FOOD OR DRINK LIQUIDS AFTER MIDNIGHT  TAKE THESE MEDICINES THE MORNING OF SURGERY: NONE  BRING C-PAP TUBING AND MASK TO HOSPITAL  YOU MAY NOT HAVE ANY METAL ON YOUR BODY INCLUDING HAIR PINS AND PIERCING'S. DO NOT WEAR JEWELRY, MAKEUP, LOTIONS, POWDERS OR PERFUMES. DO NOT WEAR NAIL POLISH. DO NOT SHAVE 48 HRS PRIOR TO SURGERY. MEN MAY SHAVE FACE AND NECK.  DO NOT [place] IS NOT RESPONSIBLE FOR VALUABLES.  CONTACTS, DENTURES OR PARTIALS MAY NOT BE WORN TO SURGERY. LEAVE SUITCASE IN CAR. CAN BE BROUGHT TO ROOM AFTER SURGERY.  PATIENTS DISCHARGED THE DAY OF SURGERY WILL NOT BE ALLOWED TO DRIVE HOME.  PLEASE READ OVER THE FOLLOWING INSTRUCTION SHEETS _________________________________________________________________________________                                          [name] - PREPARING FOR SURGERY  Before surgery, you can play an important role.  Because skin is not sterile, your skin needs to be as free of germs as possible.  You can reduce the number of germs on your skin by washing with CHG (chlorahexidine gluconate) soap before surgery.  CHG is an antiseptic cleaner which kills germs and bonds with the skin to continue killing germs even after washing. Please DO NOT use if you have an allergy to CHG or antibacterial soaps.  If your skin becomes reddened/irritated stop using the CHG and inform your nurse when you arrive at Short Stay. Do not shave (including legs and underarms) for at least 48 hours prior to the first CHG shower.  You may shave your  face. Please follow these instructions carefully:   1.  Shower with CHG Soap the night before surgery and the  morning of Surgery.   2.  If you choose to wash your hair, wash your hair first as usual with your  normal  Shampoo.   3.  After you shampoo, rinse your hair and body thoroughly to remove the  shampoo.                                         4.  Use CHG as you would any other liquid soap.  You can apply chg directly  to the skin and wash . Gently wash with scrungie or clean wascloth    5.  Apply the CHG Soap to your body ONLY FROM THE NECK DOWN.   Do not use on open                           Wound or open sores. Avoid contact with eyes, ears mouth and genitals (private parts).                        Genitals (private  parts) with your normal soap.              6.  Wash thoroughly, paying special attention to the area where your surgery  will be performed.   7.  Thoroughly rinse your body with warm water from the neck down.   8.  DO NOT shower/wash with your normal soap after using and rinsing off  the CHG Soap .                9.  Pat yourself dry with a clean towel.             10.  Wear clean night clothes to bed after shower             11.  Place clean sheets on your bed the night of your first shower and do not  sleep with pets.  Day of Surgery : Do not apply any lotions/deodorants the morning of surgery.  Please wear clean clothes to the hospital/surgery center.  FAILURE TO FOLLOW THESE INSTRUCTIONS MAY RESULT IN THE CANCELLATION OF YOUR SURGERY    PATIENT SIGNATURE_________________________________  ______________________________________________________________________     Shannon Aguilar  An incentive spirometer is a tool that can help keep your lungs clear and active. This tool measures how well you are filling your lungs with each breath. Taking long deep breaths may help reverse or decrease the chance of developing breathing (pulmonary) problems  (especially infection) following:  A long period of time when you are unable to move or be active. BEFORE THE PROCEDURE   If the spirometer includes an indicator to show your best effort, your nurse or respiratory therapist will set it to a desired goal.  If possible, sit up straight or lean slightly forward. Try not to slouch.  Hold the incentive spirometer in an upright position. INSTRUCTIONS FOR USE   Sit on the edge of your bed if possible, or sit up as far as you can in bed or on a chair.  Hold the incentive spirometer in an upright position.  Breathe out normally.  Place the mouthpiece in your mouth and seal your lips tightly around it.  Breathe in slowly and as deeply as possible, raising the piston or the ball toward the top of the column.  Hold your breath for 3-5 seconds or for as long as possible. Allow the piston or ball to fall to the bottom of the column.  Remove the mouthpiece from your mouth and breathe out normally.  Rest for a few seconds and repeat Steps 1 through 7 at least 10 times every 1-2 hours when you are awake. Take your time and take a few normal breaths between deep breaths.  The spirometer may include an indicator to show your best effort. Use the indicator as a goal to work toward during each repetition.  After each set of 10 deep breaths, practice coughing to be sure your lungs are clear. If you have an incision (the cut made at the time of surgery), support your incision when coughing by placing a pillow or rolled up towels firmly against it. Once you are able to get out of bed, walk around indoors and cough well. You may stop using the incentive spirometer when instructed by your caregiver.  RISKS AND COMPLICATIONS  Take your time so you do not get dizzy or light-headed.  If you are in pain, you may need to take or ask for pain medication before doing incentive spirometry. It is harder to  take a deep breath if you are having pain. AFTER  USE  Rest and breathe slowly and easily.  It can be helpful to keep track of a log of your progress. Your caregiver can provide you with a simple table to help with this. If you are using the spirometer at home, follow these instructions: [place] IF:   You are having difficultly using the spirometer.  You have trouble using the spirometer as often as instructed.  Your pain medication is not giving enough relief while using the spirometer.  You develop fever of 100.5 F (38.1 C) or higher. SEEK IMMEDIATE MEDICAL CARE IF:   You cough up bloody sputum that had not been present before.  You develop fever of 102 F (38.9 C) or greater.  You develop worsening pain at or near the incision site. MAKE SURE YOU:   Understand these instructions.  Will watch your condition.  Will get help right away if you are not doing well or get worse. Document Released: 12/22/2006 Document Revised: 11/03/2011 Document Reviewed: 02/22/2007 ExitCare Patient Information 2014 ExitCare, [place].   ________________________________________________________________________  WHAT IS A BLOOD TRANSFUSION? Blood Transfusion Information  A transfusion is the replacement of blood or some of its parts. Blood is made up of multiple cells which provide different functions.  Red blood cells carry oxygen and are used for blood loss replacement.  White blood cells fight against infection.  Platelets control bleeding.  Plasma helps clot blood.  Other blood products are available for specialized needs, such as hemophilia or other clotting disorders. BEFORE THE TRANSFUSION  Who gives blood for transfusions?   Healthy volunteers who are fully evaluated to make sure their blood is safe. This is blood bank blood. Transfusion therapy is the safest it has ever been in the practice of medicine. Before blood is taken from a donor, a complete history is taken to make sure that person has no history of diseases  nor engages in risky social behavior (examples are intravenous drug use or sexual activity with multiple partners). The donor's travel history is screened to minimize risk of transmitting infections, such as malaria. The donated blood is tested for signs of infectious diseases, such as HIV and hepatitis. The blood is then tested to be sure it is compatible with you in order to minimize the chance of a transfusion reaction. If you or a relative donates blood, this is often done in anticipation of surgery and is not appropriate for emergency situations. It takes many days to process the donated blood. RISKS AND COMPLICATIONS Although transfusion therapy is very safe and saves many lives, the main dangers of transfusion include:   Getting an infectious disease.  Developing a transfusion reaction. This is an allergic reaction to something in the blood you were given. Every precaution is taken to prevent this. The decision to have a blood transfusion has been considered carefully by your caregiver before blood is given. Blood is not given unless the benefits outweigh the risks. AFTER THE TRANSFUSION  Right after receiving a blood transfusion, you will usually feel much better and more energetic. This is especially true if your red blood cells have gotten low (anemic). The transfusion raises the level of the red blood cells which carry oxygen, and this usually causes an energy increase.  The nurse administering the transfusion will monitor you carefully for complications. HOME CARE INSTRUCTIONS  No special instructions are needed after a transfusion. You may find your energy is better. Speak with your  caregiver about any limitations on activity for underlying diseases you may have. SEEK MEDICAL CARE IF:   Your condition is not improving after your transfusion.  You develop redness or irritation at the intravenous (IV) site. SEEK IMMEDIATE MEDICAL CARE IF:  Any of the following symptoms occur over the  next 12 hours:  Shaking chills.  You have a temperature by mouth above 102 F (38.9 C), not controlled by medicine.  Chest, back, or muscle pain.  People around you feel you are not acting correctly or are confused.  Shortness of breath or difficulty breathing.  Dizziness and fainting.  You get a rash or develop hives.  You have a decrease in urine output.  Your urine turns a dark color or changes to pink, red, or brown. Any of the following symptoms occur over the next 10 days:  You have a temperature by mouth above 102 F (38.9 C), not controlled by medicine.  Shortness of breath.  Weakness after normal activity.  The white part of the eye turns yellow (jaundice).  You have a decrease in the amount of urine or are urinating less often.  Your urine turns a dark color or changes to pink, red, or brown. Document Released: 08/08/2000 Document Revised: 11/03/2011 Document Reviewed: 03/27/2008 [facility] Patient Information 2014 [place], [place].  _______________________________________________________________________

## 2015-11-16 NOTE — Anesthesia Preprocedure Evaluation (Addendum)
Anesthesia Evaluation  Patient identified by MRN, date of birth, ID band Patient awake    Reviewed: Allergy & Precautions, NPO status , Patient's Chart, lab work & pertinent test results  Airway Mallampati: II       Dental  (+) Teeth Intact, Dental Advisory Given   Pulmonary neg pulmonary ROS, sleep apnea and Continuous Positive Airway Pressure Ventilation , former smoker (quit 1992),    breath sounds clear to auscultation       Cardiovascular hypertension, Pt. on medications negative cardio ROS  + dysrhythmias (In past, NSR now) Atrial Fibrillation  Rhythm:Regular  EKG 10/2015 mild LVH   Neuro/Psych Anxiety Depression negative neurological ROS  negative psych ROS   GI/Hepatic negative GI ROS, Neg liver ROS,   Endo/Other  negative endocrine ROS  Renal/GU negative Renal ROSStones in past  negative genitourinary   Musculoskeletal negative musculoskeletal ROS (+)   Abdominal (+) + obese,   Peds negative pediatric ROS (+)  Hematology negative hematology ROS (+) 12/39   Anesthesia Other Findings   Reproductive/Obstetrics negative OB ROS                          Anesthesia Physical Anesthesia Plan  ASA: III  Anesthesia Plan: General and Spinal   Post-op Pain Management:    Induction:   Airway Management Planned: Nasal Cannula, Oral ETT and Video Laryngoscope Planned  Additional Equipment:   Intra-op Plan:   Post-operative Plan:   Informed Consent: I have reviewed the patients History and Physical, chart, labs and discussed the procedure including the risks, benefits and alternatives for the proposed anesthesia with the patient or authorized representative who has indicated his/her understanding and acceptance.     Plan Discussed with:   Anesthesia Plan Comments: (Had GA for knee scope  2013,LMA 4, will offer spinal, if GA desired would have Glide available (Stiff Neck and arm Pain))        Anesthesia Quick Evaluation

## 2015-11-16 NOTE — Progress Notes (Signed)
Received fax from office - Rx Cipro called to pt's pharmacy

## 2015-11-18 ENCOUNTER — Ambulatory Visit: Payer: Self-pay | Admitting: Orthopedic Surgery

## 2015-11-18 NOTE — H&P (Signed)
Shannon Aguilar DOB: [birth year] Married / Language: English / Race: White Female Date of Admission:  11/19/2015 CC:  Right Knee Pain History of Present Illness The patient is a 70 year old female who comes in for a preoperative History and Physical. The patient is scheduled for a right total knee arthroplasty to be performed by Dr. Dione Plover. Aluisio, MD at [facility] on 11/19/2015. The patient is a 70 year old female who presented for follow up of their knee. The patient is being followed for their bilateral knee pain and osteoarthritis. Symptoms reported include: pain. The following medication has been used for pain control: Tylenol. The patient following Synvisc injections. She has also used cortisone injections in the past. She states that the right knee is getting progressively worse. She had cortisone Synvisc injections, none of which helped. We previously discussed a knee replacement would be the next step. She is ready to proceed with surgery at this time. They have been treated conservatively in the past for the above stated problem and despite conservative measures, they continue to have progressive pain and severe functional limitations and dysfunction. They have failed non-operative management including home exercise, medications, and injections. It is felt that they would benefit from undergoing total joint replacement. Risks and benefits of the procedure have been discussed with the patient and they elect to proceed with surgery. There are no active contraindications to surgery such as ongoing infection or rapidly progressive neurological disease.   Problem List/Past Medical  Primary osteoarthritis of right hip (M16.11)  Baker's cyst, right (M71.21)  Primary osteoarthritis of left knee (M17.12)  Knee pain, bilateral (M25.561, M25.562)  Cardiac Arrhythmia  Chronic Cystitis  High blood pressure  Depression  Osteoarthritis  Kidney Stone  Sleep Apnea  uses  CPAP Hypercholesterolemia  Tinnitus  Cataract  Bronchitis  Atrial Fibrillation  One episode Hemorrhoids  Urinary Tract Infection  Measles  Mumps  Rubella  Menopause  Allergies  Penicillin G Sodium *PENICILLINS*  Rash.  Family History  Osteoarthritis  Mother. mother Hypertension  First Degree Relatives. mother and brother Severe allergy  brother Rheumatoid Arthritis  mother Depression  father and brother Congestive Heart Failure  father Heart Disease  mother, father and grandfather fathers side Diabetes Mellitus  mother, father and brother  Social History  Alcohol use  current drinker; drinks wine; only occasionally per week Children  1 Current work status  working part time Drug/Alcohol Rehab (Currently)  no Drug/Alcohol Rehab (Previously)  no Exercise  Exercises daily; does other and gym / weights Illicit drug use  no Living situation  live with spouse Marital status  married Number of flights of stairs before winded  2-3 Pain Contract  no Tobacco / smoke exposure  no Tobacco use  Former smoker. former smoker Post-Surgical Plans  Home  Medication History Voltaren (1% Gel, 2 gram(s) gram(s) Transdermal two or three times daily, Taken starting 06/29/2014) Active. (Apply to affected area) Glucosamine Complex (Oral) Active. Livalo (2MG  Tablet, Oral) Active. (qd) Aspirin EC (325MG  Tablet DR, Oral) Active. (qd) Tylenol (325MG  Tablet, 1 (one) Oral) Active. (PRN) Triamterene-HCTZ (37.5-25MG  Capsule, Oral) Active. (qd) Citalopram Hydrobromide (40MG  Tablet, Oral) Active. (qd) Fish Oil Active. (qd) Ginger Root (Oral) Specific strength unknown - Active. (qd)   Past Surgical History Cesarean Delivery  2 times; 1978, 1981 Tonsillectomy  1957    Review of Systems General Not Present- Chills, Fatigue, Fever, Memory Loss, Night Sweats, Weight Gain and Weight Loss. Skin Not Present- Eczema, Hives, Itching, Lesions and  Rash. HEENT Not Present- Dentures, Double Vision, Headache, Hearing Loss, Tinnitus and Visual Loss. Respiratory Present- Cough. Not Present- Allergies, Chronic Cough, Coughing up blood, Shortness of breath at rest and Shortness of breath with exertion. Cardiovascular Present- Palpitations. Not Present- Chest Pain, Difficulty Breathing Lying Down, Murmur, Racing/skipping heartbeats and Swelling. Gastrointestinal Not Present- Abdominal Pain, Bloody Stool, Constipation, Diarrhea, Difficulty Swallowing, Heartburn, Jaundice, Loss of appetitie, Nausea and Vomiting. Female Genitourinary Not Present- Blood in Urine, Discharge, Flank Pain, Incontinence, Painful Urination, Urgency, Urinary frequency, Urinary Retention, Urinating at Night and Weak urinary stream. Musculoskeletal Present- Joint Pain, Joint Swelling and Morning Stiffness. Not Present- Back Pain, Muscle Pain, Muscle Weakness and Spasms. Neurological Not Present- Blackout spells, Difficulty with balance, Dizziness, Paralysis, Tremor and Weakness. Psychiatric Not Present- Insomnia.  Vitals  Weight: 225 lb Height: 65in Body Surface Area: 2.08 m Body Mass Index: 37.44 kg/m  BP: 138/75 (Sitting, Right Arm, Standard)   Physical Exam General Mental Status -Alert, cooperative and good historian. General Appearance-pleasant, Not in acute distress. Orientation-Oriented X3. Build & Nutrition-Well nourished and Well developed.  Head and Neck Head-normocephalic, atraumatic . Neck Global Assessment - supple, no bruit auscultated on the right, no bruit auscultated on the left.  Eye Pupil - Bilateral-Regular and Round. Motion - Bilateral-EOMI.  Chest and Lung Exam Auscultation Breath sounds - clear at anterior chest wall and clear at posterior chest wall. Adventitious sounds - No Adventitious sounds.  Cardiovascular Auscultation Rhythm - Regular rate and rhythm. Heart Sounds - S1 WNL and S2 WNL. Murmurs & Other Heart  Sounds - Auscultation of the heart reveals - No Murmurs.  Abdomen Palpation/Percussion Tenderness - Abdomen is non-tender to palpation. Rigidity (guarding) - Abdomen is soft. Auscultation Auscultation of the abdomen reveals - Bowel sounds normal.  Female Genitourinary Note: Not done, not pertinent to present illness   Musculoskeletal Note: On exam, she is alert and oriented, in no apparent distress. Evaluation of her right knee shows no effusion. Range is about 5 to 125. There is marked crepitus on range of motion of the knee. There is some tenderness, medial greater than lateral, but no instability noted.  RADIOGRAPHS Radiographs are reviewed showing that she has bone on bone arthritis in the medial and patellofemoral compartments of both knees, worse on the right than the left.     Assessment & Plan Primary osteoarthritis of left knee (M17.12) Primary osteoarthritis of right knee (M17.11)  Note:Surgical Plans: Right Total Knee Replacement  Disposition: Home  PCP: Dr. Moreen Fowler  IV TXA  Anesthesia Issues: None  Signed electronically by Ok Edwards, III PA-C

## 2015-11-19 ENCOUNTER — Inpatient Hospital Stay (HOSPITAL_COMMUNITY): Payer: Medicare PPO | Admitting: Anesthesiology

## 2015-11-19 ENCOUNTER — Encounter (HOSPITAL_COMMUNITY): Payer: Self-pay | Admitting: *Deleted

## 2015-11-19 ENCOUNTER — Inpatient Hospital Stay (HOSPITAL_COMMUNITY)
Admission: RE | Admit: 2015-11-19 | Discharge: 2015-11-21 | DRG: 470 | Disposition: A | Discharge status: Home Health | Payer: Medicare PPO | Source: Ambulatory Visit | Attending: Orthopedic Surgery | Admitting: Orthopedic Surgery

## 2015-11-19 ENCOUNTER — Encounter (HOSPITAL_COMMUNITY)
Admission: RE | Disposition: A | Discharge status: Home Health | Payer: Self-pay | Source: Ambulatory Visit | Attending: Orthopedic Surgery

## 2015-11-19 DIAGNOSIS — Z6837 Body mass index (BMI) 37.0-37.9, adult: Secondary | ICD-10-CM | POA: Diagnosis not present

## 2015-11-19 DIAGNOSIS — M179 Osteoarthritis of knee, unspecified: Secondary | ICD-10-CM | POA: Diagnosis present

## 2015-11-19 DIAGNOSIS — I10 Essential (primary) hypertension: Secondary | ICD-10-CM | POA: Diagnosis present

## 2015-11-19 DIAGNOSIS — M1611 Unilateral primary osteoarthritis, right hip: Secondary | ICD-10-CM | POA: Diagnosis present

## 2015-11-19 DIAGNOSIS — Z01812 Encounter for preprocedural laboratory examination: Secondary | ICD-10-CM

## 2015-11-19 DIAGNOSIS — Z79899 Other long term (current) drug therapy: Secondary | ICD-10-CM | POA: Diagnosis not present

## 2015-11-19 DIAGNOSIS — Z7982 Long term (current) use of aspirin: Secondary | ICD-10-CM

## 2015-11-19 DIAGNOSIS — E669 Obesity, unspecified: Secondary | ICD-10-CM | POA: Diagnosis present

## 2015-11-19 DIAGNOSIS — M1711 Unilateral primary osteoarthritis, right knee: Secondary | ICD-10-CM

## 2015-11-19 DIAGNOSIS — Z87891 Personal history of nicotine dependence: Secondary | ICD-10-CM

## 2015-11-19 DIAGNOSIS — M7120 Synovial cyst of popliteal space [Baker], unspecified knee: Secondary | ICD-10-CM | POA: Diagnosis present

## 2015-11-19 DIAGNOSIS — M25561 Pain in right knee: Secondary | ICD-10-CM | POA: Diagnosis present

## 2015-11-19 DIAGNOSIS — M170 Bilateral primary osteoarthritis of knee: Principal | ICD-10-CM | POA: Diagnosis present

## 2015-11-19 DIAGNOSIS — G4730 Sleep apnea, unspecified: Secondary | ICD-10-CM | POA: Diagnosis present

## 2015-11-19 DIAGNOSIS — M1710 Unilateral primary osteoarthritis, unspecified knee: Secondary | ICD-10-CM | POA: Diagnosis present

## 2015-11-19 HISTORY — PX: TOTAL KNEE ARTHROPLASTY: SHX125

## 2015-11-19 LAB — TYPE AND SCREEN
ABO/RH(D): A NEG
ANTIBODY SCREEN: NEGATIVE

## 2015-11-19 SURGERY — ARTHROPLASTY, KNEE, TOTAL
Anesthesia: General | Site: Knee | Laterality: Right

## 2015-11-19 MED ORDER — BUPIVACAINE LIPOSOME 1.3 % IJ SUSP
20.0000 mL | Freq: Once | INTRAMUSCULAR | Status: DC
  Filled 2015-11-19: qty 20

## 2015-11-19 MED ORDER — SODIUM CHLORIDE 0.9 % IV SOLN: INTRAVENOUS | Status: DC

## 2015-11-19 MED ORDER — LIDOCAINE HCL (CARDIAC) 20 MG/ML IV SOLN
INTRAVENOUS | Status: DC | PRN
  Administered 2015-11-19: 100 mg via INTRAVENOUS

## 2015-11-19 MED ORDER — ACETAMINOPHEN 325 MG PO TABS
650.0000 mg | ORAL_TABLET | Freq: Four times a day (QID) | ORAL | Status: DC | PRN
  Administered 2015-11-20: 650 mg via ORAL
  Filled 2015-11-19: qty 2

## 2015-11-19 MED ORDER — ONDANSETRON HCL 4 MG PO TABS: 4.0000 mg | ORAL_TABLET | Freq: Four times a day (QID) | ORAL | Status: DC | PRN

## 2015-11-19 MED ORDER — BUPIVACAINE LIPOSOME 1.3 % IJ SUSP
INTRAMUSCULAR | Status: DC | PRN
  Administered 2015-11-19: 20 mL

## 2015-11-19 MED ORDER — MIDAZOLAM HCL 2 MG/2ML IJ SOLN
INTRAMUSCULAR | Status: AC
  Filled 2015-11-19: qty 2

## 2015-11-19 MED ORDER — FLEET ENEMA 7-19 GM/118ML RE ENEM: 1.0000 | ENEMA | Freq: Once | RECTAL | Status: DC | PRN

## 2015-11-19 MED ORDER — SODIUM CHLORIDE 0.9 % IR SOLN
Status: DC | PRN
  Administered 2015-11-19: 1000 mL

## 2015-11-19 MED ORDER — DEXAMETHASONE SODIUM PHOSPHATE 10 MG/ML IJ SOLN
INTRAMUSCULAR | Status: AC
  Filled 2015-11-19: qty 1

## 2015-11-19 MED ORDER — PROMETHAZINE HCL 25 MG/ML IJ SOLN: 6.2500 mg | INTRAMUSCULAR | Status: DC | PRN

## 2015-11-19 MED ORDER — SODIUM CHLORIDE 0.9 % IV SOLN
1000.0000 mg | Freq: Once | INTRAVENOUS | Status: AC
  Administered 2015-11-19: 1000 mg via INTRAVENOUS
  Filled 2015-11-19: qty 10

## 2015-11-19 MED ORDER — ONDANSETRON HCL 4 MG/2ML IJ SOLN
INTRAMUSCULAR | Status: AC
Start: 2015-11-19 — End: 2015-11-19
  Filled 2015-11-19: qty 2

## 2015-11-19 MED ORDER — ONDANSETRON HCL 4 MG/2ML IJ SOLN: 4.0000 mg | Freq: Four times a day (QID) | INTRAMUSCULAR | Status: DC | PRN

## 2015-11-19 MED ORDER — SODIUM CHLORIDE 0.9 % IJ SOLN
INTRAMUSCULAR | Status: AC
  Filled 2015-11-19: qty 50

## 2015-11-19 MED ORDER — PROPOFOL 10 MG/ML IV BOLUS
INTRAVENOUS | Status: AC
  Filled 2015-11-19: qty 40

## 2015-11-19 MED ORDER — CHLORHEXIDINE GLUCONATE 4 % EX LIQD: 60.0000 mL | Freq: Once | CUTANEOUS | Status: DC

## 2015-11-19 MED ORDER — RIVAROXABAN 10 MG PO TABS
10.0000 mg | ORAL_TABLET | Freq: Every day | ORAL | Status: DC
  Administered 2015-11-20 – 2015-11-21 (×2): 10 mg via ORAL
  Filled 2015-11-19 (×3): qty 1

## 2015-11-19 MED ORDER — FENTANYL CITRATE (PF) 100 MCG/2ML IJ SOLN
INTRAMUSCULAR | Status: AC
  Filled 2015-11-19: qty 2

## 2015-11-19 MED ORDER — MORPHINE SULFATE (PF) 2 MG/ML IV SOLN
1.0000 mg | INTRAVENOUS | Status: DC | PRN
  Administered 2015-11-19 (×4): 1 mg via INTRAVENOUS
  Filled 2015-11-19 (×3): qty 1

## 2015-11-19 MED ORDER — PROPOFOL 10 MG/ML IV BOLUS
INTRAVENOUS | Status: DC | PRN
  Administered 2015-11-19: 50 mg via INTRAVENOUS
  Administered 2015-11-19: 180 mg via INTRAVENOUS

## 2015-11-19 MED ORDER — TRANEXAMIC ACID 1000 MG/10ML IV SOLN
1000.0000 mg | INTRAVENOUS | Status: AC
  Administered 2015-11-19: 1000 mg via INTRAVENOUS
  Filled 2015-11-19: qty 10

## 2015-11-19 MED ORDER — DEXAMETHASONE SODIUM PHOSPHATE 10 MG/ML IJ SOLN
10.0000 mg | Freq: Once | INTRAMUSCULAR | Status: AC
  Administered 2015-11-19: 10 mg via INTRAVENOUS

## 2015-11-19 MED ORDER — MIDAZOLAM HCL 5 MG/5ML IJ SOLN
INTRAMUSCULAR | Status: DC | PRN
  Administered 2015-11-19: 2 mg via INTRAVENOUS

## 2015-11-19 MED ORDER — SODIUM CHLORIDE 0.9 % IJ SOLN
INTRAMUSCULAR | Status: DC | PRN
  Administered 2015-11-19: 30 mL

## 2015-11-19 MED ORDER — CEFAZOLIN SODIUM-DEXTROSE 2-3 GM-% IV SOLR
INTRAVENOUS | Status: AC
  Filled 2015-11-19: qty 50

## 2015-11-19 MED ORDER — ACETAMINOPHEN 10 MG/ML IV SOLN
INTRAVENOUS | Status: AC
  Filled 2015-11-19: qty 100

## 2015-11-19 MED ORDER — DIPHENHYDRAMINE HCL 12.5 MG/5ML PO ELIX: 12.5000 mg | ORAL_SOLUTION | ORAL | Status: DC | PRN

## 2015-11-19 MED ORDER — BUPIVACAINE HCL (PF) 0.25 % IJ SOLN
INTRAMUSCULAR | Status: AC
  Filled 2015-11-19: qty 30

## 2015-11-19 MED ORDER — CEFAZOLIN SODIUM-DEXTROSE 2-4 GM/100ML-% IV SOLN
2.0000 g | INTRAVENOUS | Status: AC
  Administered 2015-11-19: 2 g via INTRAVENOUS

## 2015-11-19 MED ORDER — 0.9 % SODIUM CHLORIDE (POUR BTL) OPTIME
TOPICAL | Status: DC | PRN
  Administered 2015-11-19: 1000 mL

## 2015-11-19 MED ORDER — ACETAMINOPHEN 650 MG RE SUPP: 650.0000 mg | Freq: Four times a day (QID) | RECTAL | Status: DC | PRN

## 2015-11-19 MED ORDER — METOCLOPRAMIDE HCL 10 MG PO TABS: 5.0000 mg | ORAL_TABLET | Freq: Three times a day (TID) | ORAL | Status: DC | PRN

## 2015-11-19 MED ORDER — PHENOL 1.4 % MT LIQD
1.0000 | OROMUCOSAL | Status: DC | PRN
  Filled 2015-11-19: qty 177

## 2015-11-19 MED ORDER — POLYETHYLENE GLYCOL 3350 17 G PO PACK: 17.0000 g | PACK | Freq: Every day | ORAL | Status: DC | PRN

## 2015-11-19 MED ORDER — ACETAMINOPHEN 500 MG PO TABS
1000.0000 mg | ORAL_TABLET | Freq: Four times a day (QID) | ORAL | Status: AC
  Administered 2015-11-19 – 2015-11-20 (×4): 1000 mg via ORAL
  Filled 2015-11-19 (×4): qty 2

## 2015-11-19 MED ORDER — STERILE WATER FOR IRRIGATION IR SOLN
Status: DC | PRN
  Administered 2015-11-19: 2000 mL

## 2015-11-19 MED ORDER — MENTHOL 3 MG MT LOZG
1.0000 | LOZENGE | OROMUCOSAL | Status: DC | PRN
  Filled 2015-11-19: qty 9

## 2015-11-19 MED ORDER — SODIUM CHLORIDE 0.9 % IV SOLN
INTRAVENOUS | Status: DC
  Administered 2015-11-19: 15:00:00 via INTRAVENOUS

## 2015-11-19 MED ORDER — OXYCODONE HCL 5 MG PO TABS
5.0000 mg | ORAL_TABLET | ORAL | Status: DC | PRN
  Administered 2015-11-19 – 2015-11-20 (×9): 10 mg via ORAL
  Filled 2015-11-19 (×9): qty 2

## 2015-11-19 MED ORDER — MORPHINE SULFATE (PF) 2 MG/ML IV SOLN
INTRAVENOUS | Status: AC
  Filled 2015-11-19: qty 1

## 2015-11-19 MED ORDER — METHOCARBAMOL 1000 MG/10ML IJ SOLN
500.0000 mg | Freq: Four times a day (QID) | INTRAVENOUS | Status: DC | PRN
  Administered 2015-11-19: 500 mg via INTRAVENOUS
  Filled 2015-11-19 (×2): qty 5

## 2015-11-19 MED ORDER — SUCCINYLCHOLINE CHLORIDE 20 MG/ML IJ SOLN
INTRAMUSCULAR | Status: DC | PRN
  Administered 2015-11-19: 100 mg via INTRAVENOUS

## 2015-11-19 MED ORDER — LIP MEDEX EX OINT
TOPICAL_OINTMENT | CUTANEOUS | Status: AC
  Filled 2015-11-19: qty 7

## 2015-11-19 MED ORDER — ONDANSETRON HCL 4 MG/2ML IJ SOLN
INTRAMUSCULAR | Status: DC | PRN
  Administered 2015-11-19: 4 mg via INTRAVENOUS

## 2015-11-19 MED ORDER — CITALOPRAM HYDROBROMIDE 20 MG PO TABS
20.0000 mg | ORAL_TABLET | Freq: Every day | ORAL | Status: DC
  Administered 2015-11-20: 20 mg via ORAL
  Filled 2015-11-19 (×2): qty 1

## 2015-11-19 MED ORDER — ACETAMINOPHEN 10 MG/ML IV SOLN
1000.0000 mg | Freq: Once | INTRAVENOUS | Status: AC
  Administered 2015-11-19: 1000 mg via INTRAVENOUS

## 2015-11-19 MED ORDER — FENTANYL CITRATE (PF) 100 MCG/2ML IJ SOLN
INTRAMUSCULAR | Status: DC | PRN
  Administered 2015-11-19 (×2): 50 ug via INTRAVENOUS
  Administered 2015-11-19: 100 ug via INTRAVENOUS

## 2015-11-19 MED ORDER — TRIAMTERENE-HCTZ 75-50 MG PO TABS
1.0000 | ORAL_TABLET | Freq: Every day | ORAL | Status: DC
  Administered 2015-11-20 – 2015-11-21 (×2): 1 via ORAL
  Filled 2015-11-19 (×2): qty 1

## 2015-11-19 MED ORDER — LACTATED RINGERS IV SOLN
INTRAVENOUS | Status: DC | PRN
  Administered 2015-11-19 (×2): via INTRAVENOUS

## 2015-11-19 MED ORDER — DEXAMETHASONE SODIUM PHOSPHATE 10 MG/ML IJ SOLN
10.0000 mg | Freq: Once | INTRAMUSCULAR | Status: DC
  Filled 2015-11-19: qty 1

## 2015-11-19 MED ORDER — MEPERIDINE HCL 50 MG/ML IJ SOLN: 6.2500 mg | INTRAMUSCULAR | Status: DC | PRN

## 2015-11-19 MED ORDER — METHOCARBAMOL 500 MG PO TABS
500.0000 mg | ORAL_TABLET | Freq: Four times a day (QID) | ORAL | Status: DC | PRN
  Administered 2015-11-19 – 2015-11-21 (×6): 500 mg via ORAL
  Filled 2015-11-19 (×6): qty 1

## 2015-11-19 MED ORDER — METOCLOPRAMIDE HCL 5 MG/ML IJ SOLN: 5.0000 mg | Freq: Three times a day (TID) | INTRAMUSCULAR | Status: DC | PRN

## 2015-11-19 MED ORDER — CEFAZOLIN SODIUM-DEXTROSE 2-4 GM/100ML-% IV SOLN
2.0000 g | Freq: Four times a day (QID) | INTRAVENOUS | Status: AC
  Administered 2015-11-19 (×2): 2 g via INTRAVENOUS
  Filled 2015-11-19 (×2): qty 100

## 2015-11-19 MED ORDER — DOCUSATE SODIUM 100 MG PO CAPS
100.0000 mg | ORAL_CAPSULE | Freq: Two times a day (BID) | ORAL | Status: DC
  Administered 2015-11-19 – 2015-11-21 (×4): 100 mg via ORAL

## 2015-11-19 MED ORDER — LIDOCAINE HCL (CARDIAC) 20 MG/ML IV SOLN
INTRAVENOUS | Status: AC
  Filled 2015-11-19: qty 5

## 2015-11-19 MED ORDER — FENTANYL CITRATE (PF) 100 MCG/2ML IJ SOLN
25.0000 ug | INTRAMUSCULAR | Status: DC | PRN
  Administered 2015-11-19 (×2): 50 ug via INTRAVENOUS

## 2015-11-19 MED ORDER — BISACODYL 10 MG RE SUPP: 10.0000 mg | Freq: Every day | RECTAL | Status: DC | PRN

## 2015-11-19 MED ORDER — BUPIVACAINE HCL 0.25 % IJ SOLN
INTRAMUSCULAR | Status: DC | PRN
  Administered 2015-11-19: 20 mL

## 2015-11-19 SURGICAL SUPPLY — 48 items
BAG DECANTER FOR FLEXI CONT (MISCELLANEOUS) ×2 IMPLANT
BANDAGE ACE 6X5 VEL STRL LF (GAUZE/BANDAGES/DRESSINGS) ×2 IMPLANT
BLADE SAG 18X100X1.27 (BLADE) ×2 IMPLANT
BLADE SAW SGTL 11.0X1.19X90.0M (BLADE) ×2 IMPLANT
BOWL SMART MIX CTS (DISPOSABLE) ×2 IMPLANT
CAP KNEE TOTAL 3 SIGMA ×1 IMPLANT
CEMENT HV SMART SET (Cement) ×4 IMPLANT
CLOTH BEACON ORANGE TIMEOUT ST (SAFETY) ×2 IMPLANT
CUFF TOURN SGL QUICK 34 (TOURNIQUET CUFF) ×2
CUFF TRNQT CYL 34X4X40X1 (TOURNIQUET CUFF) ×1 IMPLANT
DECANTER SPIKE VIAL GLASS SM (MISCELLANEOUS) ×2 IMPLANT
DRAPE U-SHAPE 47X51 STRL (DRAPES) ×2 IMPLANT
DRSG ADAPTIC 3X8 NADH LF (GAUZE/BANDAGES/DRESSINGS) ×2 IMPLANT
DRSG PAD ABDOMINAL 8X10 ST (GAUZE/BANDAGES/DRESSINGS) ×2 IMPLANT
DURAPREP 26ML APPLICATOR (WOUND CARE) ×2 IMPLANT
ELECT REM PT RETURN 9FT ADLT (ELECTROSURGICAL) ×2
ELECTRODE REM PT RTRN 9FT ADLT (ELECTROSURGICAL) ×1 IMPLANT
EVACUATOR 1/8 PVC DRAIN (DRAIN) ×2 IMPLANT
GAUZE SPONGE 4X4 12PLY STRL (GAUZE/BANDAGES/DRESSINGS) ×2 IMPLANT
GLOVE BIO SURGEON STRL SZ7.5 (GLOVE) ×1 IMPLANT
GLOVE BIO SURGEON STRL SZ8 (GLOVE) ×2 IMPLANT
GLOVE BIOGEL PI IND STRL 6.5 (GLOVE) IMPLANT
GLOVE BIOGEL PI IND STRL 7.5 (GLOVE) IMPLANT
GLOVE BIOGEL PI IND STRL 8 (GLOVE) ×1 IMPLANT
GLOVE BIOGEL PI INDICATOR 6.5 (GLOVE) ×1
GLOVE BIOGEL PI INDICATOR 7.5 (GLOVE) ×2
GLOVE BIOGEL PI INDICATOR 8 (GLOVE) ×1
GLOVE SURG SIGNA 7.5 PF LTX (GLOVE) ×1 IMPLANT
GLOVE SURG SS PI 6.5 STRL IVOR (GLOVE) ×1 IMPLANT
GOWN STRL REUS W/TWL LRG LVL3 (GOWN DISPOSABLE) ×3 IMPLANT
GOWN STRL REUS W/TWL XL LVL3 (GOWN DISPOSABLE) ×2 IMPLANT
HANDPIECE INTERPULSE COAX TIP (DISPOSABLE) ×2
IMMOBILIZER KNEE 20 (SOFTGOODS) ×1 IMPLANT
MANIFOLD NEPTUNE II (INSTRUMENTS) ×2 IMPLANT
PACK TOTAL KNEE CUSTOM (KITS) ×2 IMPLANT
PAD ABD 8X10 STRL (GAUZE/BANDAGES/DRESSINGS) ×1 IMPLANT
PADDING CAST COTTON 6X4 STRL (CAST SUPPLIES) ×4 IMPLANT
POSITIONER SURGICAL ARM (MISCELLANEOUS) ×2 IMPLANT
SET HNDPC FAN SPRY TIP SCT (DISPOSABLE) ×1 IMPLANT
STRIP CLOSURE SKIN 1/2X4 (GAUZE/BANDAGES/DRESSINGS) ×3 IMPLANT
SUT MNCRL AB 4-0 PS2 18 (SUTURE) ×2 IMPLANT
SUT VIC AB 2-0 CT1 27 (SUTURE) ×6
SUT VIC AB 2-0 CT1 TAPERPNT 27 (SUTURE) ×3 IMPLANT
SUT VLOC 180 0 24IN GS25 (SUTURE) ×2 IMPLANT
SYR 50ML LL SCALE MARK (SYRINGE) ×2 IMPLANT
TRAY FOLEY W/METER SILVER 14FR (SET/KITS/TRAYS/PACK) ×2 IMPLANT
WRAP KNEE MAXI GEL POST OP (GAUZE/BANDAGES/DRESSINGS) ×2 IMPLANT
YANKAUER SUCT BULB TIP 10FT TU (MISCELLANEOUS) ×2 IMPLANT

## 2015-11-19 NOTE — Evaluation (Signed)
Physical Therapy Evaluation Patient Details Name: Shannon Aguilar MRN: [id number] DOB: 1946-01-17 Today's Date: 11/19/2015   History of Present Illness  R TKR  Clinical Impression  Pt s/p R TKR presents with decreased R LE strength/ROM and post op pain limiting functional mobility.  Pt should progress well to dc home with family assist and HHPT follow up.    Follow Up Recommendations Home health PT    Equipment Recommendations  Rolling walker with 5" wheels;3in1 (PT)    Recommendations for Other Services OT consult     Precautions / Restrictions Precautions Precautions: Knee;Fall Required Braces or Orthoses: Knee Immobilizer - Right Knee Immobilizer - Right: Discontinue once straight leg raise with < 10 degree lag Restrictions Weight Bearing Restrictions: No Other Position/Activity Restrictions: WBAT      Mobility  Bed Mobility Overal bed mobility: Needs Assistance Bed Mobility: Supine to Sit     Supine to sit: Min assist     General bed mobility comments: cues for sequence and use of L LE to self assist  Transfers Overall transfer level: Needs assistance Equipment used: Rolling walker (2 wheeled) Transfers: Sit to/from Stand Sit to Stand: Min assist         General transfer comment: cues for LE management and use of UEs to self assist  Ambulation/Gait Ambulation/Gait assistance: Min assist Ambulation Distance (Feet): 32 Feet Assistive device: Rolling walker (2 wheeled) Gait Pattern/deviations: Step-to pattern;Decreased step length - right;Decreased step length - left;Shuffle;Trunk flexed Gait velocity: decr   General Gait Details: cues for sequence, posture and position from [employer]            Wheelchair Mobility    Modified Rankin (Stroke Patients Only)       Balance                                             Pertinent Vitals/Pain Pain Assessment: 0-10 Pain Score: 5  Pain Location: R knee/thigh Pain Descriptors  / Indicators: Aching;Sore Pain Intervention(s): Limited activity within patient's tolerance;Monitored during session;Premedicated before session;Ice applied    Home Living Family/patient expects to be discharged to:: Private residence Living Arrangements: Spouse/significant other Available Help at Discharge: Family Type of Home: House Home Access: Stairs to enter Entrance Stairs-Rails: Right;Left;Can reach both Technical brewer of Steps: 4 Home Layout: One level Home Equipment: None      Prior Function Level of Independence: Independent               Hand Dominance        Extremity/Trunk Assessment   Upper Extremity Assessment: Overall WFL for tasks assessed           Lower Extremity Assessment: RLE deficits/detail      Cervical / Trunk Assessment: Normal  Communication   Communication: No difficulties  Cognition Arousal/Alertness: Awake/alert Behavior During Therapy: WFL for tasks assessed/performed Overall Cognitive Status: Within Functional Limits for tasks assessed                      General Comments      Exercises Total Joint Exercises Ankle Circles/Pumps: AROM;Both;15 reps;Supine      Assessment/Plan    PT Assessment Patient needs continued PT services  PT Diagnosis Difficulty walking   PT Problem List Decreased strength;Decreased range of motion;Decreased activity tolerance;Decreased mobility;Decreased knowledge of use of DME;Obesity;Pain  PT Treatment Interventions DME  instruction;Gait training;Stair training;Functional mobility training;Therapeutic activities;Therapeutic exercise;Patient/family education   PT Goals (Current goals can be found in the Care Plan section) Acute Rehab PT Goals Patient Stated Goal: Regain IND and walk with less pain PT Goal Formulation: With patient Time For Goal Achievement: 11/24/15 Potential to Achieve Goals: Good    Frequency 7X/week   Barriers to discharge        Co-evaluation                End of Session Equipment Utilized During Treatment: Gait belt;Right knee immobilizer Activity Tolerance: Patient tolerated treatment well Patient left: in chair;with call bell/phone within reach;with chair alarm set;with family/visitor present Nurse Communication: Mobility status         Time: 1540-1610 PT Time Calculation (min) (ACUTE ONLY): 30 min   Charges:   PT Evaluation $PT Eval Low Complexity: 1 Procedure PT Treatments $Gait Training: 8-22 mins   PT G Codes:        Kensi Karr 12/12/2015, 4:50 PM

## 2015-11-19 NOTE — Transfer of Care (Signed)
Immediate Anesthesia Transfer of Care Note  Patient: Shannon Aguilar  Procedure(s) Performed: Procedure(s): TOTAL KNEE ARTHROPLASTY (Right)  Patient Location: PACU  Anesthesia Type:General  Level of Consciousness: sedated  Airway & Oxygen Therapy: Patient Spontanous Breathing and Patient connected to face mask oxygen  Post-op Assessment: Report given to RN and Post -op Vital signs reviewed and stable  Post vital signs: Reviewed and stable  Last Vitals:  Filed Vitals:   11/19/15 0523  BP: 147/57  Pulse: 67  Temp: 37.2 C  Resp: 16    Complications: No apparent anesthesia complications

## 2015-11-19 NOTE — Anesthesia Postprocedure Evaluation (Signed)
Anesthesia Post Note  Patient: Gean Birchwood  Procedure(s) Performed: Procedure(s) (LRB): TOTAL KNEE ARTHROPLASTY (Right)  Patient location during evaluation: PACU Anesthesia Type: General Level of consciousness: awake and alert Pain management: pain level controlled Vital Signs Assessment: post-procedure vital signs reviewed and stable Respiratory status: spontaneous breathing, nonlabored ventilation, respiratory function stable and patient connected to nasal cannula oxygen Cardiovascular status: blood pressure returned to baseline and stable Postop Assessment: no signs of nausea or vomiting Anesthetic complications: no    Last Vitals:  Filed Vitals:   11/19/15 0845 11/19/15 0900  BP: 171/68 153/72  Pulse: 71 69  Temp:    Resp: 18 12    Last Pain:  Filed Vitals:   11/19/15 0905  PainSc: [place]

## 2015-11-19 NOTE — H&P (View-Only) (Signed)
Shannon Aguilar DOB: 03-07-1946 Married / Language: English / Race: White Female Date of Admission:  11/19/2015 CC:  Right Knee Pain History of Present Illness The patient is a 70 year old female who comes in for a preoperative History and Physical. The patient is scheduled for a right total knee arthroplasty to be performed by Dr. Dione Plover. Aluisio, MD at [facility] on 11/19/2015. The patient is a 70 year old female who presented for follow up of their knee. The patient is being followed for their bilateral knee pain and osteoarthritis. Symptoms reported include: pain. The following medication has been used for pain control: Tylenol. The patient following Synvisc injections. She has also used cortisone injections in the past. She states that the right knee is getting progressively worse. She had cortisone Synvisc injections, none of which helped. We previously discussed a knee replacement would be the next step. She is ready to proceed with surgery at this time. They have been treated conservatively in the past for the above stated problem and despite conservative measures, they continue to have progressive pain and severe functional limitations and dysfunction. They have failed non-operative management including home exercise, medications, and injections. It is felt that they would benefit from undergoing total joint replacement. Risks and benefits of the procedure have been discussed with the patient and they elect to proceed with surgery. There are no active contraindications to surgery such as ongoing infection or rapidly progressive neurological disease.   Problem List/Past Medical  Primary osteoarthritis of right hip (M16.11)  Baker's cyst, right (M71.21)  Primary osteoarthritis of left knee (M17.12)  Knee pain, bilateral (M25.561, M25.562)  Cardiac Arrhythmia  Chronic Cystitis  High blood pressure  Depression  Osteoarthritis  Kidney Stone  Sleep Apnea  uses  CPAP Hypercholesterolemia  Tinnitus  Cataract  Bronchitis  Atrial Fibrillation  One episode Hemorrhoids  Urinary Tract Infection  Measles  Mumps  Rubella  Menopause  Allergies  Penicillin G Sodium *PENICILLINS*  Rash.  Family History  Osteoarthritis  Mother. mother Hypertension  First Degree Relatives. mother and brother Severe allergy  brother Rheumatoid Arthritis  mother Depression  father and brother Congestive Heart Failure  father Heart Disease  mother, father and grandfather fathers side Diabetes Mellitus  mother, father and brother  Social History  Alcohol use  current drinker; drinks wine; only occasionally per week Children  1 Current work status  working part time Drug/Alcohol Rehab (Currently)  no Drug/Alcohol Rehab (Previously)  no Exercise  Exercises daily; does other and gym / weights Illicit drug use  no Living situation  live with spouse Marital status  married Number of flights of stairs before winded  2-3 Pain Contract  no Tobacco / smoke exposure  no Tobacco use  Former smoker. former smoker Post-Surgical Plans  Home  Medication History Voltaren (1% Gel, 2 gram(s) gram(s) Transdermal two or three times daily, Taken starting 06/29/2014) Active. (Apply to affected area) Glucosamine Complex (Oral) Active. Livalo (2MG  Tablet, Oral) Active. (qd) Aspirin EC (325MG  Tablet DR, Oral) Active. (qd) Tylenol (325MG  Tablet, 1 (one) Oral) Active. (PRN) Triamterene-HCTZ (37.5-25MG  Capsule, Oral) Active. (qd) Citalopram Hydrobromide (40MG  Tablet, Oral) Active. (qd) Fish Oil Active. (qd) Ginger Root (Oral) Specific strength unknown - Active. (qd)   Past Surgical History Cesarean Delivery  2 times; 1978, 1981 Tonsillectomy  1957    Review of Systems General Not Present- Chills, Fatigue, Fever, Memory Loss, Night Sweats, Weight Gain and Weight Loss. Skin Not Present- Eczema, Hives, Itching, Lesions and  Rash. HEENT Not Present- Dentures, Double Vision, Headache, Hearing Loss, Tinnitus and Visual Loss. Respiratory Present- Cough. Not Present- Allergies, Chronic Cough, Coughing up blood, Shortness of breath at rest and Shortness of breath with exertion. Cardiovascular Present- Palpitations. Not Present- Chest Pain, Difficulty Breathing Lying Down, Murmur, Racing/skipping heartbeats and Swelling. Gastrointestinal Not Present- Abdominal Pain, Bloody Stool, Constipation, Diarrhea, Difficulty Swallowing, Heartburn, Jaundice, Loss of appetitie, Nausea and Vomiting. Female Genitourinary Not Present- Blood in Urine, Discharge, Flank Pain, Incontinence, Painful Urination, Urgency, Urinary frequency, Urinary Retention, Urinating at Night and Weak urinary stream. Musculoskeletal Present- Joint Pain, Joint Swelling and Morning Stiffness. Not Present- Back Pain, Muscle Pain, Muscle Weakness and Spasms. Neurological Not Present- Blackout spells, Difficulty with balance, Dizziness, Paralysis, Tremor and Weakness. Psychiatric Not Present- Insomnia.  Vitals  Weight: 225 lb Height: 65in Body Surface Area: 2.08 m Body Mass Index: 37.44 kg/m  BP: 138/75 (Sitting, Right Arm, Standard)   Physical Exam General Mental Status -Alert, cooperative and good historian. General Appearance-pleasant, Not in acute distress. Orientation-Oriented X3. Build & Nutrition-Well nourished and Well developed.  Head and Neck Head-normocephalic, atraumatic . Neck Global Assessment - supple, no bruit auscultated on the right, no bruit auscultated on the left.  Eye Pupil - Bilateral-Regular and Round. Motion - Bilateral-EOMI.  Chest and Lung Exam Auscultation Breath sounds - clear at anterior chest wall and clear at posterior chest wall. Adventitious sounds - No Adventitious sounds.  Cardiovascular Auscultation Rhythm - Regular rate and rhythm. Heart Sounds - S1 WNL and S2 WNL. Murmurs & Other Heart  Sounds - Auscultation of the heart reveals - No Murmurs.  Abdomen Palpation/Percussion Tenderness - Abdomen is non-tender to palpation. Rigidity (guarding) - Abdomen is soft. Auscultation Auscultation of the abdomen reveals - Bowel sounds normal.  Female Genitourinary Note: Not done, not pertinent to present illness   Musculoskeletal Note: On exam, she is alert and oriented, in no apparent distress. Evaluation of her right knee shows no effusion. Range is about 5 to 125. There is marked crepitus on range of motion of the knee. There is some tenderness, medial greater than lateral, but no instability noted.  RADIOGRAPHS Radiographs are reviewed showing that she has bone on bone arthritis in the medial and patellofemoral compartments of both knees, worse on the right than the left.     Assessment & Plan Primary osteoarthritis of left knee (M17.12) Primary osteoarthritis of right knee (M17.11)  Note:Surgical Plans: Right Total Knee Replacement  Disposition: Home  PCP: Dr. Moreen Fowler  IV TXA  Anesthesia Issues: None  Signed electronically by Ok Edwards, III PA-C

## 2015-11-19 NOTE — Anesthesia Procedure Notes (Signed)
Procedure Name: Intubation Date/Time: 11/19/2015 7:06 AM Performed by: Lind Covert Pre-anesthesia Checklist: Patient identified, Timeout performed, Emergency Drugs available, Suction available and Patient being monitored Patient Re-evaluated:Patient Re-evaluated prior to inductionOxygen Delivery Method: Circle system utilized Preoxygenation: Pre-oxygenation with 100% oxygen Intubation Type: IV induction Laryngoscope Size: Mac and 4 Grade View: Grade II Tube type: Oral Tube size: 7.0 mm Number of attempts: 1 Airway Equipment and Method: Stylet Placement Confirmation: ETT inserted through vocal cords under direct vision,  breath sounds checked- equal and bilateral and positive ETCO2 Secured at: 21 cm Tube secured with: Tape Dental Injury: Teeth and Oropharynx as per pre-operative assessment

## 2015-11-19 NOTE — Op Note (Signed)
Pre-operative diagnosis- Osteoarthritis  Right knee(s)  Post-operative diagnosis- Osteoarthritis Right knee(s)  Procedure-  Right  Total Knee Arthroplasty  Surgeon- Dione Plover. Michel Eskelson, MD  Assistant- Ardeen Jourdain, PA-C   Anesthesia-  General  EBL-* No blood loss amount entered *   Drains Hemovac  Tourniquet time- 34 minutes @ [id number] mm Hg  Complications- None  Condition-PACU - hemodynamically stable.   Brief Clinical Note  Shannon Aguilar is a 70 y.o. year old female with end stage OA of her right knee with progressively worsening pain and dysfunction. She has constant pain, with activity and at rest and significant functional deficits with difficulties even with ADLs. She has had extensive non-op management including analgesics, injections of cortisone and viscosupplements, and home exercise program, but remains in significant pain with significant dysfunction.Radiographs show bone on bone arthritis medial and patellofemoral. She presents now for right Total Knee Arthroplasty.    Procedure in detail---   The patient is brought into the operating room and positioned supine on the operating table. After successful administration of  General,   a tourniquet is placed high on the  Right thigh(s) and the lower extremity is prepped and draped in the usual sterile fashion. Time out is performed by the operating team and then the  Right lower extremity is wrapped in Esmarch, knee flexed and the tourniquet inflated to 300 mmHg.       A midline incision is made with a ten blade through the subcutaneous tissue to the level of the extensor mechanism. A fresh blade is used to make a medial parapatellar arthrotomy. Soft tissue over the proximal medial tibia is subperiosteally elevated to the joint line with a knife and into the semimembranosus bursa with a Cobb elevator. Soft tissue over the proximal lateral tibia is elevated with attention being paid to avoiding the patellar tendon on the tibial  tubercle. The patella is everted, knee flexed 90 degrees and the ACL and PCL are removed. Findings are bone on bone medial and patellofemoral with large global osteophytes.        The drill is used to create a starting hole in the distal femur and the canal is thoroughly irrigated with sterile saline to remove the fatty contents. The 5 degree Right  valgus alignment guide is placed into the femoral canal and the distal femoral cutting block is pinned to remove 10 mm off the distal femur. Resection is made with an oscillating saw.      The tibia is subluxed forward and the menisci are removed. The extramedullary alignment guide is placed referencing proximally at the medial aspect of the tibial tubercle and distally along the second metatarsal axis and tibial crest. The block is pinned to remove 6mm off the more deficient medial  side. Resection is made with an oscillating saw. Size 3is the most appropriate size for the tibia and the proximal tibia is prepared with the modular drill and keel punch for that size.      The femoral sizing guide is placed and size 4 is most appropriate. Rotation is marked off the epicondylar axis and confirmed by creating a rectangular flexion gap at 90 degrees. The size 4 cutting block is pinned in this rotation and the anterior, posterior and chamfer cuts are made with the oscillating saw. The intercondylar block is then placed and that cut is made.      Trial size 3 tibial component, trial size 4 narrow posterior stabilized femur and a 10  mm posterior stabilized  rotating platform insert trial is placed. Full extension is achieved with excellent varus/valgus and anterior/posterior balance throughout full range of motion. The patella is everted and thickness measured to be 22  mm. Free hand resection is taken to 12 mm, a 35 template is placed, lug holes are drilled, trial patella is placed, and it tracks normally. Osteophytes are removed off the posterior femur with the trial in  place. All trials are removed and the cut bone surfaces prepared with pulsatile lavage. Cement is mixed and once ready for implantation, the size 3 tibial implant, size  4 narrow posterior stabilized femoral component, and the size 35 patella are cemented in place and the patella is held with the clamp. The trial insert is placed and the knee held in full extension. The Exparel (20 ml mixed with 30 ml saline) and .25% Bupivicaine, are injected into the extensor mechanism, posterior capsule, medial and lateral gutters and subcutaneous tissues.  All extruded cement is removed and once the cement is hard the permanent 10 mm posterior stabilized rotating platform insert is placed into the tibial tray.      The wound is copiously irrigated with saline solution and the extensor mechanism closed over a hemovac drain with #1 V-loc suture. The tourniquet is released for a total tourniquet time of 34  minutes. Flexion against gravity is 140 degrees and the patella tracks normally. Subcutaneous tissue is closed with 2.0 vicryl and subcuticular with running 4.0 Monocryl. The incision is cleaned and dried and steri-strips and a bulky sterile dressing are applied. The limb is placed into a knee immobilizer and the patient is awakened and transported to recovery in stable condition.      Please note that a surgical assistant was a medical necessity for this procedure in order to perform it in a safe and expeditious manner. Surgical assistant was necessary to retract the ligaments and vital neurovascular structures to prevent injury to them and also necessary for proper positioning of the limb to allow for anatomic placement of the prosthesis.   Dione Plover Oviya Ammar, MD    11/19/2015, 8:08 AM

## 2015-11-19 NOTE — Interval H&P Note (Signed)
History and Physical Interval Note:  11/19/2015 6:46 AM  Shannon Aguilar  has presented today for surgery, with the diagnosis of RIGHT KNEE OA  The various methods of treatment have been discussed with the patient and family. After consideration of risks, benefits and other options for treatment, the patient has consented to  Procedure(s): TOTAL KNEE ARTHROPLASTY (Right) as a surgical intervention .  The patient's history has been reviewed, patient examined, no change in status, stable for surgery.  I have reviewed the patient's chart and labs.  Questions were answered to the patient's satisfaction.     Gearlean Alf

## 2015-11-20 LAB — CBC
HCT: 34.6 % — ABNORMAL LOW (ref 36.0–46.0)
HEMOGLOBIN: 11.5 g/dL — AB (ref 12.0–15.0)
MCH: 30.3 pg (ref 26.0–34.0)
MCHC: 33.2 g/dL (ref 30.0–36.0)
MCV: 91.3 fL (ref 78.0–100.0)
Platelets: 270 10*3/uL (ref 150–400)
RBC: 3.79 MIL/uL — AB (ref 3.87–5.11)
RDW: 13.5 % (ref 11.5–15.5)
WBC: 14 10*3/uL — ABNORMAL HIGH (ref 4.0–10.5)

## 2015-11-20 LAB — BASIC METABOLIC PANEL
Anion gap: 11 (ref 5–15)
BUN: 23 mg/dL — ABNORMAL HIGH (ref 6–20)
CHLORIDE: 103 mmol/L (ref 101–111)
CO2: 27 mmol/L (ref 22–32)
Calcium: 8.7 mg/dL — ABNORMAL LOW (ref 8.9–10.3)
Creatinine, Ser: 0.83 mg/dL (ref 0.44–1.00)
GFR calc non Af Amer: >60 mL/min (ref >60)
Glucose, Bld: 137 mg/dL — ABNORMAL HIGH (ref 65–99)
POTASSIUM: 4 mmol/L (ref 3.5–5.1)
SODIUM: 141 mmol/L (ref 135–145)

## 2015-11-20 MED ORDER — TRAMADOL HCL 50 MG PO TABS
50.0000 mg | ORAL_TABLET | Freq: Four times a day (QID) | ORAL | Status: DC | PRN
  Administered 2015-11-20 – 2015-11-21 (×2): 100 mg via ORAL
  Filled 2015-11-20 (×2): qty 2

## 2015-11-20 MED ORDER — ZOLPIDEM TARTRATE 5 MG PO TABS: 5.0000 mg | ORAL_TABLET | Freq: Every evening | ORAL | Status: DC | PRN

## 2015-11-20 MED ORDER — OXYCODONE HCL 5 MG PO TABS: 5.0000 mg | ORAL_TABLET | ORAL | Status: DC | PRN

## 2015-11-20 MED ORDER — METHOCARBAMOL 500 MG PO TABS: 500.0000 mg | ORAL_TABLET | Freq: Four times a day (QID) | ORAL | Status: DC | PRN

## 2015-11-20 MED ORDER — RIVAROXABAN 10 MG PO TABS: 10.0000 mg | ORAL_TABLET | Freq: Every day | ORAL | Status: DC

## 2015-11-20 MED ORDER — OXYCODONE HCL 5 MG PO TABS
5.0000 mg | ORAL_TABLET | ORAL | Status: DC | PRN
  Administered 2015-11-20: 15 mg via ORAL
  Administered 2015-11-20: 10 mg via ORAL
  Administered 2015-11-20 – 2015-11-21 (×4): 15 mg via ORAL
  Filled 2015-11-20: qty 2
  Filled 2015-11-20 (×5): qty 3

## 2015-11-20 MED ORDER — RIVAROXABAN 10 MG PO TABS
10.0000 mg | ORAL_TABLET | Freq: Every day | ORAL | Status: DC
Start: 2015-11-20 — End: 2015-11-20

## 2015-11-20 NOTE — Progress Notes (Signed)
   11/20/15 1449  PT Visit Information  Last PT Received On 11/20/15  Assistance Needed +1  History of Present Illness R TKR  PT Time Calculation  PT Start Time (ACUTE ONLY) 1412  PT Stop Time (ACUTE ONLY) 1433  PT Time Calculation (min) (ACUTE ONLY) 21 min  Subjective Data  Subjective It still hurts,  but ok  Patient Stated Goal Regain IND and walk with less pain  Precautions  Precautions Knee;Fall  Knee Immobilizer - Right Discontinue once straight leg raise with < 10 degree lag  Restrictions  Weight Bearing Restrictions No  Other Position/Activity Restrictions WBAT  Pain Assessment  Pain Assessment 0-10  Pain Score 8  Pain Location R knee thigh  Pain Descriptors / Indicators Constant;Sore  Pain Intervention(s) Limited activity within patient's tolerance;Monitored during session;Premedicated before session;Repositioned;Patient requesting pain meds-RN notified;Ice applied;Heat applied (heat to thigh, ice to knee)  Cognition  Arousal/Alertness Awake/alert  Behavior During Therapy WFL for tasks assessed/performed  Overall Cognitive Status Within Functional Limits for tasks assessed  Bed Mobility  Overal bed mobility Needs Assistance  Bed Mobility Supine to Sit;Sit to Supine  Supine to sit Min assist  Sit to supine Min assist  General bed mobility comments cues for sequence and use of L LE to self assist  Transfers  Overall transfer level Needs assistance  Equipment used Rolling walker (2 wheeled)  Transfers Sit to/from Stand  Sit to Stand Supervision  General transfer comment cues for LE management and use of UEs to self assist  Ambulation/Gait  Ambulation/Gait assistance Min guard  Ambulation Distance (Feet) 130 Feet  Assistive device Rolling walker (2 wheeled)  Gait Pattern/deviations Step-to pattern;Antalgic  General Gait Details cues for sequence, posture and position from RW  Gait velocity decr  Total Joint Exercises  Ankle Circles/Pumps AROM;Both;15 reps;Supine   Quad Sets AROM;Both;10 reps;Supine  PT - End of Session  Equipment Utilized During Treatment Gait belt  Activity Tolerance Patient tolerated treatment well;Patient limited by pain  Patient left in bed;with call bell/phone within reach;with family/visitor present;with bed alarm set  Nurse Communication Mobility status  PT - Assessment/Plan  PT Plan Current plan remains appropriate  PT Frequency (ACUTE ONLY) 7X/week  Recommendations for Other Services OT consult  Follow Up Recommendations Home health PT  PT equipment Rolling walker with 5" wheels;3in1 (PT)  PT Goal Progression  Progress towards PT goals Progressing toward goals  Acute Rehab PT Goals  PT Goal Formulation With patient  Time For Goal Achievement 11/24/15  Potential to Achieve Goals Good  PT General Charges  $$ ACUTE PT VISIT 1 Procedure  PT Treatments  $Gait Training 8-22 mins

## 2015-11-20 NOTE — Discharge Summary (Signed)
Physician Discharge Summary   Patient ID: Shannon Aguilar MRN: [id number] DOB/AGE: [birth year] 70 y.o.  Admit date: 11/19/2015 Discharge date: 11-20-2015  Primary Diagnosis:  Osteoarthritis Right knee(s)  Admission Diagnoses:  Past Medical History  Diagnosis Date  . Chronic kidney disease 02-25-12    kidney stones- litho's x2  . Hypertension   . Depression   . Sleep apnea 02-25-12    uses cpap since '00  . Arthritis 02-25-12    arthritis-neck, fingers, hips, knees  . Transfusion history 02-25-12    After last C-Section '81  . Dysrhythmia 2006    hx. A. Fib, no recent problems -rate control-NSR  at present  . History of skin cancer   . Anxiety    Discharge Diagnoses:   Principal Problem:   OA (osteoarthritis) of knee  Estimated body mass index is 36.7 kg/(m^2) as calculated from the following:   Height as of this encounter: 5' 5.5" (1.664 m).   Weight as of this encounter: 101.606 kg (224 lb).  Procedure:  Procedure(s) (LRB): TOTAL KNEE ARTHROPLASTY (Right)   Consults: None  HPI: SARALYN WILLISON is a 70 y.o. year old female with end stage OA of her right knee with progressively worsening pain and dysfunction. She has constant pain, with activity and at rest and significant functional deficits with difficulties even with ADLs. She has had extensive non-op management including analgesics, injections of cortisone and viscosupplements, and home exercise program, but remains in significant pain with significant dysfunction.Radiographs show bone on bone arthritis medial and patellofemoral. She presents now for right Total Knee Arthroplasty.  Laboratory Data: Admission on 11/19/2015  Component Date Value Ref Range Status  . WBC 11/20/2015 14.0* 4.0 - 10.5 K/uL Final  . RBC 11/20/2015 3.79* 3.87 - 5.11 MIL/uL Final  . Hemoglobin 11/20/2015 11.5* 12.0 - 15.0 g/dL Final  . HCT 11/20/2015 34.6* 36.0 - 46.0 % Final  . MCV 11/20/2015 91.3  78.0 - 100.0 fL Final  . MCH 11/20/2015 30.3   26.0 - 34.0 pg Final  . MCHC 11/20/2015 33.2  30.0 - 36.0 g/dL Final  . RDW 11/20/2015 13.5  11.5 - 15.5 % Final  . Platelets 11/20/2015 270  150 - 400 K/uL Final  . Sodium 11/20/2015 141  135 - 145 mmol/L Final  . Potassium 11/20/2015 4.0  3.5 - 5.1 mmol/L Final  . Chloride 11/20/2015 103  101 - 111 mmol/L Final  . CO2 11/20/2015 27  22 - 32 mmol/L Final  . Glucose, Bld 11/20/2015 137* 65 - 99 mg/dL Final  . BUN 11/20/2015 23* 6 - 20 mg/dL Final  . Creatinine, Ser 11/20/2015 0.83  0.44 - 1.00 mg/dL Final  . Calcium 11/20/2015 8.7* 8.9 - 10.3 mg/dL Final  . GFR calc non Af Amer 11/20/2015 >60  >60 mL/min Final  . GFR calc Af Amer 11/20/2015 >60  >60 mL/min Final   Comment: (NOTE) The eGFR has been calculated using the CKD EPI equation. This calculation has not been validated in all clinical situations. eGFR's persistently <60 mL/min signify possible Chronic Kidney Disease.   [name] gap 11/20/2015 11  5 - 15 Final  Hospital Outpatient Visit on 11/12/2015  Component Date Value Ref Range Status  . aPTT 11/12/2015 28  24 - 37 seconds Final  . WBC 11/12/2015 7.8  4.0 - 10.5 K/uL Final  . RBC 11/12/2015 4.22  3.87 - 5.11 MIL/uL Final  . Hemoglobin 11/12/2015 12.7  12.0 - 15.0 g/dL Final  . HCT 11/12/2015 39.4  36.0 - 46.0 % Final  . MCV 11/12/2015 93.4  78.0 - 100.0 fL Final  . MCH 11/12/2015 30.1  26.0 - 34.0 pg Final  . MCHC 11/12/2015 32.2  30.0 - 36.0 g/dL Final  . RDW 11/12/2015 13.0  11.5 - 15.5 % Final  . Platelets 11/12/2015 267  150 - 400 K/uL Final  . Sodium 11/12/2015 141  135 - 145 mmol/L Final  . Potassium 11/12/2015 4.1  3.5 - 5.1 mmol/L Final  . Chloride 11/12/2015 101  101 - 111 mmol/L Final  . CO2 11/12/2015 28  22 - 32 mmol/L Final  . Glucose, Bld 11/12/2015 75  65 - 99 mg/dL Final  . BUN 11/12/2015 29* 6 - 20 mg/dL Final  . Creatinine, Ser 11/12/2015 0.88  0.44 - 1.00 mg/dL Final  . Calcium 11/12/2015 9.2  8.9 - 10.3 mg/dL Final  . Total Protein 11/12/2015 7.2   6.5 - 8.1 g/dL Final  . Albumin 11/12/2015 4.3  3.5 - 5.0 g/dL Final  . AST 11/12/2015 31  15 - 41 U/L Final  . ALT 11/12/2015 38  14 - 54 U/L Final  . Alkaline Phosphatase 11/12/2015 62  38 - 126 U/L Final  . Total Bilirubin 11/12/2015 0.7  0.3 - 1.2 mg/dL Final  . GFR calc non Af Amer 11/12/2015 >60  >60 mL/min Final  . GFR calc Af Amer 11/12/2015 >60  >60 mL/min Final   Comment: (NOTE) The eGFR has been calculated using the CKD EPI equation. This calculation has not been validated in all clinical situations. eGFR's persistently <60 mL/min signify possible Chronic Kidney Disease.   . Anion gap 11/12/2015 12  5 - 15 Final  . Prothrombin Time 11/12/2015 13.1  11.6 - 15.2 seconds Final  . INR 11/12/2015 0.97  0.00 - 1.49 Final  . ABO/RH(D) 11/12/2015 A NEG   Final  . Antibody Screen 11/12/2015 NEG   Final  . Sample Expiration 11/12/2015 11/22/2015   Final  . Extend sample reason 11/12/2015 NO TRANSFUSIONS OR PREGNANCY IN THE PAST 3 MONTHS   Final  . Color, Urine 11/12/2015 YELLOW  YELLOW Final  . APPearance 11/12/2015 CLEAR  CLEAR Final  . Specific Gravity, Urine 11/12/2015 1.015  1.005 - 1.030 Final  . pH 11/12/2015 6.5  5.0 - 8.0 Final  . Glucose, UA 11/12/2015 NEGATIVE  NEGATIVE mg/dL Final  . Hgb urine dipstick 11/12/2015 NEGATIVE  NEGATIVE Final  . Bilirubin Urine 11/12/2015 NEGATIVE  NEGATIVE Final  . Ketones, ur 11/12/2015 NEGATIVE  NEGATIVE mg/dL Final  . Protein, ur 11/12/2015 NEGATIVE  NEGATIVE mg/dL Final  . Nitrite 11/12/2015 NEGATIVE  NEGATIVE Final  . Leukocytes, UA 11/12/2015 SMALL* NEGATIVE Final  . MRSA, PCR 11/12/2015 NEGATIVE  NEGATIVE Final  . Staphylococcus aureus 11/12/2015 NEGATIVE  NEGATIVE Final   Comment:        The Xpert SA Assay (FDA approved for NASAL specimens in patients over 69 years of age), is one component of a comprehensive surveillance program.  Test performance has been validated by [facility] for patients greater than or equal to 70  year old. It is not intended to diagnose infection nor to guide or monitor treatment.   . Squamous Epithelial / LPF 11/12/2015 0-5* NONE SEEN Final  . WBC, UA 11/12/2015 6-30  0 - 5 WBC/hpf Final  . RBC / HPF 11/12/2015 0-5  0 - 5 RBC/hpf Final  . Bacteria, UA 11/12/2015 RARE* NONE SEEN Final  . ABO/RH(D) 11/12/2015 A NEG   Final  X-Rays:No results found.  EKG: Orders placed or performed during the hospital encounter of 11/12/15  . EKG 12 lead  . EKG 12 lead     Hospital Course: JAIANA SHEFFER is a 70 y.o. who was admitted to [facility]. They were brought to the operating room on 11/19/2015 and underwent Procedure(s): TOTAL KNEE ARTHROPLASTY.  Patient tolerated the procedure well and was later transferred to the recovery room and then to the orthopaedic floor for postoperative care.  They were given PO and IV analgesics for pain control following their surgery.  They were given 24 hours of postoperative antibiotics of  Anti-infectives    Start     Dose/Rate Route Frequency Ordered Stop   11/19/15 1400  ceFAZolin (ANCEF) IVPB 2g/100 mL premix     2 g 200 mL/hr over 30 Minutes Intravenous Every 6 hours 11/19/15 0942 11/19/15 2136   11/19/15 0520  ceFAZolin (ANCEF) IVPB 2g/100 mL premix     2 g 200 mL/hr over 30 Minutes Intravenous On call to O.R. 11/19/15 1245 11/19/15 0708     and started on DVT prophylaxis in the form of Xarelto.   PT and OT were ordered for total joint protocol.  Discharge planning consulted to help with postop disposition and equipment needs.  Patient had a good night on the evening of surgery. She walked 32 feet the day of surgery. They started to get up OOB with therapy on day one. Hemovac drain was pulled without difficulty.  Continued to work with therapy into day two.  Dressing was changed on day two and the incision was healing well. Patient was seen in rounds on day two and was ready to go home.  Discharge home with home health Diet - Cardiac  diet Follow up - in 2 weeks Activity - WBAT Disposition - Home Condition Upon Discharge - Good D/C Meds - See DC Summary DVT Prophylaxis - Xarelto  Discharge Instructions    Call MD / Call 911    Complete by:  As directed   If you experience chest pain or shortness of breath, CALL 911 and be transported to the hospital emergency room.  If you develope a fever above 101 F, pus (white drainage) or increased drainage or redness at the wound, or calf pain, call your surgeon's office.     Change dressing    Complete by:  As directed   Change dressing daily with sterile 4 x 4 inch gauze dressing and apply TED hose. Do not submerge the incision under water.     Constipation Prevention    Complete by:  As directed   Drink plenty of fluids.  Prune juice may be helpful.  You may use a stool softener, such as Colace (over the counter) 100 mg twice a day.  Use MiraLax (over the counter) for constipation as needed.     Diet - low sodium heart healthy    Complete by:  As directed      Discharge instructions    Complete by:  As directed   Pick up stool softner and laxative for home use following surgery while on pain medications. Do not submerge incision under water. May remove the surgical dressing tomorrow, Wednesday 11/21/2015, and then apply a dry gauze dressing daily. Please use good hand washing techniques while changing dressing each day. May shower starting three days after surgery starting Thursday 11/22/2015. Please use a clean towel to pat the incision dry following showers. Continue to use ice for pain  and swelling after surgery. Do not use any lotions or creams on the incision until instructed by your surgeon.  Postoperative Constipation Protocol  Constipation - defined medically as fewer than three stools per week and severe constipation as less than one stool per week.  One of the most common issues patients have following surgery is constipation. Even if you have a regular bowel  pattern at home, your normal regimen is likely to be disrupted due to multiple reasons following surgery. Combination of anesthesia, postoperative narcotics, change in appetite and fluid intake all can affect your bowels. In order to avoid complications following surgery, here are some recommendations in order to help you during your recovery period.  Colace (docusate) - Pick up an over-the-counter form of Colace or another stool softener and take twice a day as long as you are requiring postoperative pain medications. Take with a full glass of water daily. If you experience loose stools or diarrhea, hold the colace until you stool forms back up. If your symptoms do not get better within 1 week or if they get worse, check with your doctor.  Dulcolax (bisacodyl) - Pick up over-the-counter and take as directed by the product packaging as needed to assist with the movement of your bowels. Take with a full glass of water. Use this product as needed if not relieved by Colace only.   MiraLax (polyethylene glycol) - Pick up over-the-counter to have on hand. MiraLax is a solution that will increase the amount of water in your bowels to assist with bowel movements. Take as directed and can mix with a glass of water, juice, soda, coffee, or tea. Take if you go more than two days without a movement. Do not use MiraLax more than once per day. Call your doctor if you are still constipated or irregular after using this medication for 7 days in a row.  If you continue to have problems with postoperative constipation, please contact the office for further assistance and recommendations. If you experience "the worst abdominal pain ever" or develop nausea or vomiting, please contact the office immediatly for further recommendations for treatment.  Take Xarelto for two and a half more weeks, then discontinue Xarelto. Once the patient has completed the Xarelto, they may resume the 325 mg Aspirin.     Do not put  a pillow under the knee. Place it under the heel.    Complete by:  As directed      Do not sit on low chairs, stoools or toilet seats, as it may be difficult to get up from low surfaces    Complete by:  As directed      Driving restrictions    Complete by:  As directed   No driving until released by the physician.     Increase activity slowly as tolerated    Complete by:  As directed      Lifting restrictions    Complete by:  As directed   No lifting until released by the physician.     Patient may shower    Complete by:  As directed   You may shower without a dressing once there is no drainage.  Do not wash over the wound.  If drainage remains, do not shower until drainage stops.     TED hose    Complete by:  As directed   Use stockings (TED hose) for 3 weeks on both leg(s).  You may remove them at night for sleeping.  Weight bearing as tolerated    Complete by:  As directed   Laterality:  right  Extremity:  Lower            Medication List    STOP taking these medications        aspirin 325 MG tablet     b complex vitamins capsule     ciprofloxacin 500 MG tablet  Commonly known as:  CIPRO     CO Q-10 PO     fish oil-omega-3 fatty acids 1000 MG capsule     GINGER PO     multivitamin with minerals Tabs tablet     PROBIOTIC DAILY PO     TURMERIC PO      TAKE these medications        citalopram 20 MG tablet  Commonly known as:  CELEXA  Take 20 mg by mouth daily.     LIVALO 2 MG Tabs  Generic drug:  Pitavastatin Calcium  Take 2 mg by mouth at bedtime.     methocarbamol 500 MG tablet  Commonly known as:  ROBAXIN  Take 1 tablet (500 mg total) by mouth every 6 (six) hours as needed for muscle spasms.     oxyCODONE 5 MG immediate release tablet  Commonly known as:  Oxy IR/ROXICODONE  Take 1-2 tablets (5-10 mg total) by mouth every 3 (three) hours as needed for moderate pain or severe pain.     rivaroxaban 10 MG Tabs tablet  Commonly known as:  XARELTO    Take 1 tablet (10 mg total) by mouth daily with breakfast. Take Xarelto for two and a half more weeks, then discontinue Xarelto. Once the patient has completed the Xarelto, they may resume the 325 mg Aspirin.     SYSTANE OP  Apply 1 drop to eye 2 (two) times daily as needed (for dry eyes). Dry eyes     triamterene-hydrochlorothiazide 75-50 MG tablet  Commonly known as:  MAXZIDE  Take 1 tablet by mouth daily.           Follow-up Information    Follow up with Gearlean Alf, MD. Schedule an appointment as soon as possible for a visit on 12/04/2015.   Specialty:  Orthopedic Surgery   Why:  Call office at [phone] to setup appt. on Tuesday 12/04/2015 with Dr. Wynelle Link.   Contact information:   [address] [place] [address] [phone]       Signed: [name], PA-C Orthopaedic Surgery 11/20/2015, 8:31 AM

## 2015-11-20 NOTE — Progress Notes (Signed)
Physical Therapy Treatment Patient Details Name: Shannon Aguilar MRN: [id number] DOB: [birth year] Today's Date: 11/20/2015    History of Present Illness R TKR    PT Comments    Pt progressing well this am and pleased with progress.    Follow Up Recommendations  Home health PT     Equipment Recommendations  Rolling walker with 5" wheels;3in1 (PT)    Recommendations for Other Services OT consult     Precautions / Restrictions Precautions Precautions: Knee;Fall Required Braces or Orthoses: Knee Immobilizer - Right Knee Immobilizer - Right: Discontinue once straight leg raise with < 10 degree lag (Pt performed IND SLR this am) Restrictions Weight Bearing Restrictions: No Other Position/Activity Restrictions: WBAT    Mobility  Bed Mobility Overal bed mobility: Needs Assistance Bed Mobility: Supine to Sit     Supine to sit: Min guard     General bed mobility comments: cues for sequence and use of L LE to self assist  Transfers Overall transfer level: Needs assistance Equipment used: Rolling walker (2 wheeled) Transfers: Sit to/from Stand Sit to Stand: Min guard         General transfer comment: cues for LE management and use of UEs to self assist  Ambulation/Gait Ambulation/Gait assistance: Min guard Ambulation Distance (Feet): 120 Feet Assistive device: Rolling walker (2 wheeled) Gait Pattern/deviations: Step-to pattern;Step-through pattern;Decreased step length - right;Decreased step length - left;Shuffle;Trunk flexed Gait velocity: decr   General Gait Details: cues for sequence, posture and position from [employer]            Wheelchair Mobility    Modified Rankin (Stroke Patients Only)       Balance                                    Cognition Arousal/Alertness: Awake/alert Behavior During Therapy: WFL for tasks assessed/performed Overall Cognitive Status: Within Functional Limits for tasks assessed                      Exercises Total Joint Exercises Ankle Circles/Pumps: AROM;Both;15 reps;Supine Quad Sets: AROM;Both;10 reps;Supine Heel Slides: AAROM;Right;15 reps;Supine Straight Leg Raises: AAROM;AROM;Right;15 reps;Supine Goniometric ROM: AAROM R knee -10 - 65    General Comments        Pertinent Vitals/Pain Pain Assessment: 0-10 Pain Score: 4  Pain Location: R knee/thigh Pain Descriptors / Indicators: Aching;Sore Pain Intervention(s): Limited activity within patient's tolerance;Monitored during session;Premedicated before session;Ice applied    Home Living                      Prior Function            PT Goals (current goals can now be found in the care plan section) Acute Rehab PT Goals Patient Stated Goal: Regain IND and walk with less pain PT Goal Formulation: With patient Time For Goal Achievement: 11/24/15 Potential to Achieve Goals: Good Progress towards PT goals: Progressing toward goals    Frequency  7X/week    PT Plan Current plan remains appropriate    Co-evaluation             End of Session Equipment Utilized During Treatment: Gait belt Activity Tolerance: Patient tolerated treatment well Patient left: in chair;with call bell/phone within reach;with chair alarm set     Time: [id number] PT Time Calculation (min) (ACUTE ONLY): 35 min  Charges:  $Gait Training: 8-22 mins $  Therapeutic Exercise: 8-22 mins                    G Codes:      Keltie Labell 12-08-2015, 8:46 AM

## 2015-11-20 NOTE — Discharge Instructions (Addendum)
° °Dr. Frank Aluisio °Total Joint Specialist °[facility] °[address] °[place], [place] [address] °[phone] ° °TOTAL KNEE REPLACEMENT POSTOPERATIVE DIRECTIONS ° °Knee Rehabilitation, Guidelines Following Surgery  °Results after knee surgery are often greatly improved when you follow the exercise, range of motion and muscle strengthening exercises prescribed by your doctor. Safety measures are also important to protect the knee from further injury. Any time any of these exercises cause you to have increased pain or swelling in your knee joint, decrease the amount until you are comfortable again and slowly increase them. If you have problems or questions, call your caregiver or physical therapist for advice.  ° °HOME CARE INSTRUCTIONS  °Remove items at home which could result in a fall. This includes throw rugs or furniture in walking pathways.  °· ICE to the affected knee every three hours for 30 minutes at a time and then as needed for pain and swelling.  Continue to use ice on the knee for pain and swelling from surgery. You may notice swelling that will progress down to the foot and ankle.  This is normal after surgery.  Elevate the leg when you are not up walking on it.   °· Continue to use the breathing machine which will help keep your temperature down.  It is common for your temperature to cycle up and down following surgery, especially at night when you are not up moving around and exerting yourself.  The breathing machine keeps your lungs expanded and your temperature down. °· Do not place pillow under knee, focus on keeping the knee straight while resting ° °DIET °You may resume your previous home diet once your are discharged from the hospital. ° °DRESSING / WOUND CARE / SHOWERING °You may shower 3 days after surgery, but keep the wounds dry during showering.  You may use an occlusive plastic wrap (Press'n Seal for example), NO SOAKING/SUBMERGING IN THE BATHTUB.  If the  bandage gets wet, change with a clean dry gauze.  If the incision gets wet, pat the wound dry with a clean towel. °You may start showering once you are discharged home but do not submerge the incision under water. Just pat the incision dry and apply a dry gauze dressing on daily. °Change the surgical dressing daily and reapply a dry dressing each time. ° °ACTIVITY °Walk with your walker as instructed. °Use walker as long as suggested by your caregivers. °Avoid periods of inactivity such as sitting longer than an hour when not asleep. This helps prevent blood clots.  °You may resume a sexual relationship in one month or when given the OK by your doctor.  °You may return to work once you are cleared by your doctor.  °Do not drive a car for 6 weeks or until released by you surgeon.  °Do not drive while taking narcotics. ° °WEIGHT BEARING °Weight bearing as tolerated with assist device (walker, cane, etc) as directed, use it as long as suggested by your surgeon or therapist, typically at least 4-6 weeks. ° °POSTOPERATIVE CONSTIPATION PROTOCOL °Constipation - defined medically as fewer than three stools per week and severe constipation as less than one stool per week. ° °One of the most common issues patients have following surgery is constipation.  Even if you have a regular bowel pattern at home, your normal regimen is likely to be disrupted due to multiple reasons following surgery.  Combination of anesthesia, postoperative narcotics, change in appetite and fluid intake all can affect your bowels.    In order to avoid complications following surgery, here are some recommendations in order to help you during your recovery period. ° °Colace (docusate) - Pick up an over-the-counter form of Colace or another stool softener and take twice a day as long as you are requiring postoperative pain medications.  Take with a full glass of water daily.  If you experience loose stools or diarrhea, hold the colace until you stool forms  back up.  If your symptoms do not get better within 1 week or if they get worse, check with your doctor. ° °Dulcolax (bisacodyl) - Pick up over-the-counter and take as directed by the product packaging as needed to assist with the movement of your bowels.  Take with a full glass of water.  Use this product as needed if not relieved by Colace only.  ° °MiraLax (polyethylene glycol) - Pick up over-the-counter to have on hand.  MiraLax is a solution that will increase the amount of water in your bowels to assist with bowel movements.  Take as directed and can mix with a glass of water, juice, soda, coffee, or tea.  Take if you go more than two days without a movement. °Do not use MiraLax more than once per day. Call your doctor if you are still constipated or irregular after using this medication for 7 days in a row. ° °If you continue to have problems with postoperative constipation, please contact the office for further assistance and recommendations.  If you experience "the worst abdominal pain ever" or develop nausea or vomiting, please contact the office immediatly for further recommendations for treatment. ° °ITCHING ° If you experience itching with your medications, try taking only a single pain pill, or even half a pain pill at a time.  You can also use Benadryl over the counter for itching or also to help with sleep.  ° °TED HOSE STOCKINGS °Wear the elastic stockings on both legs for three weeks following surgery during the day but you may remove then at night for sleeping. ° °MEDICATIONS °See your medication summary on the “After Visit Summary” that the nursing staff will review with you prior to discharge.  You may have some home medications which will be placed on hold until you complete the course of blood thinner medication.  It is important for you to complete the blood thinner medication as prescribed by your surgeon.  Continue your approved medications as instructed at time of  discharge. ° °PRECAUTIONS °If you experience chest pain or shortness of breath - call 911 immediately for transfer to the hospital emergency department.  °If you develop a fever greater that 101 F, purulent drainage from wound, increased redness or drainage from wound, foul odor from the wound/dressing, or calf pain - CONTACT YOUR SURGEON.   °                                                °FOLLOW-UP APPOINTMENTS °Make sure you keep all of your appointments after your operation with your surgeon and caregivers. You should call the office at the above phone number and make an appointment for approximately two weeks after the date of your surgery or on the date instructed by your surgeon outlined in the "After Visit Summary". ° ° °RANGE OF MOTION AND STRENGTHENING EXERCISES  °Rehabilitation of the knee is important following a knee injury or   an operation. After just a few days of immobilization, the muscles of the thigh which control the knee become weakened and shrink (atrophy). Knee exercises are designed to build up the tone and strength of the thigh muscles and to improve knee motion. Often times heat used for twenty to thirty minutes before working out will loosen up your tissues and help with improving the range of motion but do not use heat for the first two weeks following surgery. These exercises can be done on a training (exercise) mat, on the floor, on a table or on a bed. Use what ever works the best and is most comfortable for you Knee exercises include:  Leg Lifts - While your knee is still immobilized in a splint or cast, you can do straight leg raises. Lift the leg to 60 degrees, hold for 3 sec, and slowly lower the leg. Repeat 10-20 times 2-3 times daily. Perform this exercise against resistance later as your knee gets better.  Quad and Hamstring Sets - Tighten up the muscle on the front of the thigh (Quad) and hold for 5-10 sec. Repeat this 10-20 times hourly. Hamstring sets are done by pushing the  foot backward against an object and holding for 5-10 sec. Repeat as with quad sets.   Leg Slides: Lying on your back, slowly slide your foot toward your buttocks, bending your knee up off the floor (only go as far as is comfortable). Then slowly slide your foot back down until your leg is flat on the floor again.  Angel Wings: Lying on your back spread your legs to the side as far apart as you can without causing discomfort.  A rehabilitation program following serious knee injuries can speed recovery and prevent re-injury in the future due to weakened muscles. Contact your doctor or a physical therapist for more information on knee rehabilitation.   IF YOU ARE TRANSFERRED TO A SKILLED REHAB FACILITY If the patient is transferred to a skilled rehab facility following release from the hospital, a list of the current medications will be sent to the facility for the patient to continue.  When discharged from the skilled rehab facility, please have the facility set up the patient's [place] prior to being released. Also, the skilled facility will be responsible for providing the patient with their medications at time of release from the facility to include their pain medication, the muscle relaxants, and their blood thinner medication. If the patient is still at the rehab facility at time of the two week follow up appointment, the skilled rehab facility will also need to assist the patient in arranging follow up appointment in our office and any transportation needs.  MAKE SURE YOU:  Understand these instructions.  Get help right away if you are not doing well or get worse.    Pick up stool softner and laxative for home use following surgery while on pain medications. Do not submerge incision under water. Please use good hand washing techniques while changing dressing each day. May shower starting three days after surgery. Please use a clean towel to pat the incision dry following  showers. Continue to use ice for pain and swelling after surgery. Do not use any lotions or creams on the incision until instructed by your surgeon.   Take Xarelto for two and a half more weeks, then discontinue Xarelto. Once the patient has completed the Xarelto, they may resume the 325 mg Aspirin.   Information on my medicine - XARELTO (Rivaroxaban)  This medication education was reviewed with me or my healthcare representative as part of my discharge preparation.  The pharmacist that spoke with me during my hospital stay was:  Runyon, Amanda, RPH ° °Why was Xarelto® prescribed for you? °Xarelto® was prescribed for you to reduce the risk of blood clots forming after orthopedic surgery. The medical term for these abnormal blood clots is venous thromboembolism (VTE). ° °What do you need to know about xarelto® ? °Take your Xarelto® ONCE DAILY at the same time every day. °You may take it either with or without food. ° °If you have difficulty swallowing the tablet whole, you may crush it and mix in applesauce just prior to taking your dose. ° °Take Xarelto® exactly as prescribed by your doctor and DO NOT stop taking Xarelto® without talking to the doctor who prescribed the medication.  Stopping without other VTE prevention medication to take the place of Xarelto® may increase your risk of developing a clot. ° °After discharge, you should have regular check-up appointments with your healthcare provider that is prescribing your Xarelto®.   ° °What do you do if you miss a dose? °If you miss a dose, take it as soon as you remember on the same day then continue your regularly scheduled once daily regimen the next day. Do not take two doses of Xarelto® on the same day.  ° °Important Safety Information °A possible side effect of Xarelto® is bleeding. You should call your healthcare provider right away if you experience any of the following: °? Bleeding from an injury or your nose that does not stop. °? Unusual  colored urine (red or dark brown) or unusual colored stools (red or black). °? Unusual bruising for unknown reasons. °? A serious fall or if you hit your head (even if there is no bleeding). ° °Some medicines may interact with Xarelto® and might increase your risk of bleeding while on Xarelto®. To help avoid this, consult your healthcare provider or pharmacist prior to using any new prescription or non-prescription medications, including herbals, vitamins, non-steroidal anti-inflammatory drugs (NSAIDs) and supplements. ° °This website has more information on Xarelto®: www.xarelto.com. ° ° ° °

## 2015-11-20 NOTE — Care Management Note (Signed)
Case Management Note  Patient Details  Name: Shannon Aguilar MRN: [id number] Date of Birth: [birth year]  Subjective/Objective:                  TOTAL KNEE ARTHROPLASTY (Right) Action/Plan: Discharge planning Expected Discharge Date:  11/20/17               Expected Discharge Plan:  [place]  In-House Referral:     Discharge planning Services  CM Consult  Post Acute Care Choice:    Choice offered to:  Patient  DME Arranged:  3-N-1, Walker rolling DME Agency:  [place]:  PT [place]:  [place]  Status of Service:  Completed, signed off  Medicare Important Message Given:    Date Medicare IM Given:    Medicare IM give by:    Date Additional Medicare IM Given:    Additional Medicare Important Message give by:     If discussed at [place] of Stay Meetings, dates discussed:    Additional Comments: Utilization Review complete.  CM met with pt in room to offer choice of home health agency.  Pt chooses [facility] to render HHPT. Referral given to [facility] rep, Tim (on unit).  CM called [facility] DME rep, Lecretia to please deliver the rolling walker and 3n1 prior to discharge.  No other CM needs were communicated. Dellie Catholic, RN 11/20/2015, 1:31 PM

## 2015-11-20 NOTE — Progress Notes (Signed)
Occupational Therapy Evaluation Patient Details Name: Shannon Aguilar MRN: [id number] DOB: [birth year] Today's Date: 11/20/2015    History of Present Illness Shannon Aguilar TKR   Clinical Impression   Evaluation limited by pt's pain; will complete OT education next session. Moves well despite pain.    Follow Up Recommendations  No OT follow up;Supervision/Assistance - 24 hour    Equipment Recommendations  3 in 1 bedside comode    Recommendations for Other Services       Precautions / Restrictions Precautions Precautions: Knee;Fall Restrictions Weight Bearing Restrictions: No Other Position/Activity Restrictions: WBAT      Mobility Bed Mobility Overal bed mobility: Needs Assistance Bed Mobility: Supine to Sit     Supine to sit: Min guard        Transfers Overall transfer level: Needs assistance Equipment used: Rolling walker (2 wheeled) Transfers: Sit to/from Stand Sit to Stand: Supervision              Balance                                            ADL Overall ADL's : Needs assistance/impaired Eating/Feeding: Independent;Sitting;Bed level   Grooming: Wash/dry Radiographer, therapeutic: Supervision/safety;Ambulation;Regular Toilet;BSC;RW   Toileting- Clothing Manipulation and Hygiene: Supervision/safety;Sit to/from stand       Functional mobility during ADLs: Supervision/safety;Rolling walker General ADL Comments: Patient in a lot of pain during evaluation; worked on ambulation to bathroom, toileting, groom at sink. Will complete OT education next session.     Vision     Perception     Praxis      Pertinent Vitals/Pain Pain Assessment: 0-10 Pain Score: 8  Pain Location: Shannon Aguilar knee/thigh Pain Descriptors / Indicators: Aching;Sore Pain Intervention(s): Limited activity within patient's tolerance;Premedicated before session;Monitored during session;Repositioned;Ice applied      Hand Dominance     Extremity/Trunk Assessment Upper Extremity Assessment Upper Extremity Assessment: Overall WFL for tasks assessed   Lower Extremity Assessment Lower Extremity Assessment: Defer to PT evaluation   Cervical / Trunk Assessment Cervical / Trunk Assessment: Normal   Communication Communication Communication: No difficulties   Cognition Arousal/Alertness: Awake/alert Behavior During Therapy: WFL for tasks assessed/performed Overall Cognitive Status: Within Functional Limits for tasks assessed                     General Comments       Exercises       Shoulder Instructions      Home Living Family/patient expects to be discharged to:: Private residence Living Arrangements: Spouse/significant other Available Help at Discharge: Family Type of Home: House Home Access: Stairs to enter Technical brewer of Steps: 4 Entrance Stairs-Rails: Right;Left;Can reach both Home Layout: One level     Bathroom Shower/Tub: Teacher, [name] years/pre: Standard Bathroom Accessibility: Yes How Accessible: Accessible via walker Home Equipment: None          Prior Functioning/Environment Level of Independence: Independent             OT Diagnosis: Acute pain   OT Problem List: Decreased strength;Decreased range of motion;Decreased activity tolerance;Decreased knowledge of use of DME or AE;Pain   OT Treatment/Interventions: Self-care/ADL training;DME and/or AE instruction;Therapeutic activities;Patient/family education    OT Goals(Current goals can be found in the care  plan section) Acute Rehab OT Goals Patient Stated Goal: Regain IND and walk with less pain OT Goal Formulation: With patient Time For Goal Achievement: 12/04/15 Potential to Achieve Goals: Good ADL Goals Pt Will Perform Lower Body Bathing: with min assist;sit to/from stand Pt Will Perform Lower Body Dressing: with min assist;sit to/from stand Pt Will Perform Tub/Shower  Transfer: with min assist;ambulating;rolling walker  OT Frequency: Min 2X/week   Barriers to D/C:            Co-evaluation              End of Session Equipment Utilized During Treatment: Rolling walker Nurse Communication: Mobility status  Activity Tolerance: Patient limited by pain Patient left: in bed;with call bell/phone within reach;with bed alarm set   Time: [id number] OT Time Calculation (min): 17 min Charges:  OT General Charges $OT Visit: 1 Procedure OT Evaluation $OT Eval Low Complexity: 1 Procedure G-Codes:    Trinidee Schrag A Nov 29, 2015, 2:22 PM

## 2015-11-20 NOTE — Progress Notes (Signed)
   Subjective: 1 Day Post-Op Procedure(s) (LRB): TOTAL KNEE ARTHROPLASTY (Right) Patient reports pain as mild.   She walked 32 feet the day of surgery. Patient seen in rounds with Dr. Wynelle Link. Patient is well, but has had some minor complaints of pain in the knee, requiring pain medications We will start therapy today.  If they do well with therapy and meets all goals, then will allow home later this afternoon following therapy. Plan is to go Home after hospital stay.  Objective: Vital signs in last 24 hours: Temp:  [97.7 F (36.5 C)-98.9 F (37.2 C)] 98.4 F (36.9 C) (03/28 0558) Pulse Rate:  [63-77] 71 (03/28 0558) Resp:  [12-18] 16 (03/28 0558) BP: (106-185)/(30-82) 114/43 mmHg (03/28 0558) SpO2:  [90 %-99 %] 96 % (03/28 0558)  Intake/Output from previous day:  Intake/Output Summary (Last 24 hours) at 11/20/15 0814 Last data filed at 11/20/15 0600  Gross per 24 hour  Intake   2950 ml  Output   4015 ml  Net  -1065 ml    Labs:  Recent Labs  11/20/15 0414  HGB 11.5*    Recent Labs  11/20/15 0414  WBC 14.0*  RBC 3.79*  HCT 34.6*  PLT 270    Recent Labs  11/20/15 0414  NA 141  K 4.0  CL 103  CO2 27  BUN 23*  CREATININE 0.83  GLUCOSE 137*  CALCIUM 8.7*   No results for input(s): LABPT, INR in the last 72 hours.  EXAM General - Patient is Alert, Appropriate and Oriented Extremity - Neurovascular intact Sensation intact distally Dorsiflexion/Plantar flexion intact Dressing - dressing C/D/I Motor Function - intact, moving foot and toes well on exam.  Hemovac pulled without difficulty.  Past Medical History  Diagnosis Date  . Chronic kidney disease 02-25-12    kidney stones- litho's x2  . Hypertension   . Depression   . Sleep apnea 02-25-12    uses cpap since '00  . Arthritis 02-25-12    arthritis-neck, fingers, hips, knees  . Transfusion history 02-25-12    After last C-Section '81  . Dysrhythmia 2006    hx. A. Fib, no recent problems -rate  control-NSR  at present  . History of skin cancer   . Anxiety     Assessment/Plan: 1 Day Post-Op Procedure(s) (LRB): TOTAL KNEE ARTHROPLASTY (Right) Principal Problem:   OA (osteoarthritis) of knee  Estimated body mass index is 36.7 kg/(m^2) as calculated from the following:   Height as of this encounter: 5' 5.5" (1.664 m).   Weight as of this encounter: 101.606 kg (224 lb). Advance diet Up with therapy Discharge home with home health  DVT Prophylaxis - Xarelto Weight-Bearing as tolerated to right leg D/C O2 and Pulse OX and try on Room Air  If meets goals and able to go home: Discharge home with home health Diet - Cardiac diet Follow up - in 2 weeks on Tuesday 12/04/15 Activity - WBAT Disposition - Home Condition Upon Discharge - Pending D/C Meds - See DC Summary DVT Prophylaxis - [place], PA-C Orthopaedic Surgery

## 2015-11-21 LAB — CBC
HEMATOCRIT: 34.2 % — AB (ref 36.0–46.0)
HEMOGLOBIN: 11.2 g/dL — AB (ref 12.0–15.0)
MCH: 29.9 pg (ref 26.0–34.0)
MCHC: 32.7 g/dL (ref 30.0–36.0)
MCV: 91.2 fL (ref 78.0–100.0)
Platelets: 287 10*3/uL (ref 150–400)
RBC: 3.75 MIL/uL — ABNORMAL LOW (ref 3.87–5.11)
RDW: 13.7 % (ref 11.5–15.5)
WBC: 16.1 10*3/uL — AB (ref 4.0–10.5)

## 2015-11-21 LAB — BASIC METABOLIC PANEL
ANION GAP: 11 (ref 5–15)
BUN: 20 mg/dL (ref 6–20)
CHLORIDE: 99 mmol/L — AB (ref 101–111)
CO2: 28 mmol/L (ref 22–32)
Calcium: 8.6 mg/dL — ABNORMAL LOW (ref 8.9–10.3)
Creatinine, Ser: 0.7 mg/dL (ref 0.44–1.00)
GFR calc non Af Amer: >60 mL/min (ref >60)
GLUCOSE: 119 mg/dL — AB (ref 65–99)
Potassium: 3.6 mmol/L (ref 3.5–5.1)
Sodium: 138 mmol/L (ref 135–145)

## 2015-11-21 NOTE — Progress Notes (Signed)
Occupational Therapy Treatment Patient Details Name: Shannon Aguilar MRN: [id number] DOB: July 24, 1946 Today's Date: 11/21/2015    History of present illness R TKR   OT comments  Patient progressing well. Plan on returning for second session after PA works with her and prior to discharge home.  Follow Up Recommendations  No OT follow up;Supervision/Assistance - 24 hour    Equipment Recommendations  3 in 1 bedside comode    Recommendations for Other Services      Precautions / Restrictions Precautions Precautions: Knee;Fall Restrictions Weight Bearing Restrictions: No Other Position/Activity Restrictions: WBAT       Mobility Bed Mobility Overal bed mobility: Needs Assistance Bed Mobility: Supine to Sit;Sit to Supine     Supine to sit: Supervision        Transfers Overall transfer level: Needs assistance Equipment used: Rolling walker (2 wheeled) Transfers: Sit to/from Stand Sit to Stand: Supervision              Balance                                   ADL Overall ADL's : Needs assistance/impaired     Grooming: Wash/dry hands;Oral care;Wash/dry face;Supervision/safety;Standing   Upper Body Bathing: Set up;Sitting   Lower Body Bathing: Minimal assistance;Sit to/from stand           Toilet Transfer: Supervision/safety;Ambulation;Regular Toilet;BSC;RW   Toileting- Clothing Manipulation and Hygiene: Supervision/safety;Sit to/from stand       Functional mobility during ADLs: Supervision/safety;Rolling walker General ADL Comments: Patient bathed, groomed, toileted this session. PA came in to see pt; left pt in care of PA and plan to return for second session to educate pt on getting dressed.      Vision                     Perception     Praxis      Cognition   Behavior During Therapy: WFL for tasks assessed/performed Overall Cognitive Status: Within Functional Limits for tasks assessed                        Extremity/Trunk Assessment               Exercises     Shoulder Instructions       General Comments      Pertinent Vitals/ Pain       Pain Assessment: 0-10 Pain Score: 5  Pain Location: R knee/thigh Pain Descriptors / Indicators: Aching;Sore Pain Intervention(s): Limited activity within patient's tolerance;Monitored during session;Repositioned  Home Living                                          Prior Functioning/Environment              Frequency Min 2X/week     Progress Toward Goals  OT Goals(current goals can now be found in the care plan section)  Progress towards OT goals: Progressing toward goals  Acute Rehab OT Goals Patient Stated Goal: Regain IND and walk with less pain  Plan Discharge plan remains appropriate    Co-evaluation                 End of Session    Activity Tolerance Patient tolerated treatment well   Patient Left  in bed;with call bell/phone within reach;Other (comment) (with PA in room)   Nurse Communication Mobility status        Time: [phone] OT Time Calculation (min): 22 min  Charges: OT General Charges $OT Visit: 1 Procedure OT Treatments $Self Care/Home Management : 8-22 mins  Brianna Esson A 11/21/2015, 11:40 AM

## 2015-11-21 NOTE — Progress Notes (Signed)
Physical Therapy Treatment Patient Details Name: Shannon Aguilar MRN: [id number] DOB: Apr 30, 1946 Today's Date: 11/21/2015    History of Present Illness R TKR    PT Comments    Pt progressing well with mobility but limited this am by fatigue.  Reviewed therex and stairs.  Follow Up Recommendations  Home health PT     Equipment Recommendations  Rolling walker with 5" wheels;3in1 (PT)    Recommendations for Other Services OT consult     Precautions / Restrictions Precautions Precautions: Knee;Fall Restrictions Weight Bearing Restrictions: No Other Position/Activity Restrictions: WBAT    Mobility  Bed Mobility Overal bed mobility: Needs Assistance Bed Mobility: Supine to Sit;Sit to Supine     Supine to sit: Supervision Sit to supine: Supervision   General bed mobility comments: Increased time.  Cues for use of L LE to self assist  Transfers Overall transfer level: Needs assistance Equipment used: Rolling walker (2 wheeled) Transfers: Sit to/from Stand Sit to Stand: Supervision         General transfer comment: cues for LE management and use of UEs to self assist  Ambulation/Gait Ambulation/Gait assistance: Min guard;Supervision Ambulation Distance (Feet): 75 Feet Assistive device: Rolling walker (2 wheeled) Gait Pattern/deviations: Step-to pattern;Step-through pattern;Decreased step length - right;Decreased step length - left;Shuffle;Trunk flexed     General Gait Details: cues for sequence, posture and position from RW   Stairs Stairs: Yes Stairs assistance: Min assist Stair Management: Two rails;Step to pattern;Forwards Number of Stairs: 2 General stair comments: cues for sequence and foot placement.  Pt declines to practice second time  Wheelchair Mobility    Modified Rankin (Stroke Patients Only)       Balance                                    Cognition Arousal/Alertness: Awake/alert Behavior During Therapy: WFL for tasks  assessed/performed Overall Cognitive Status: Within Functional Limits for tasks assessed                      Exercises Total Joint Exercises Ankle Circles/Pumps: AROM;Both;15 reps;Supine Quad Sets: AROM;Both;10 reps;Supine Heel Slides: AAROM;Right;15 reps;Supine Straight Leg Raises: AAROM;AROM;Right;15 reps;Supine    General Comments        Pertinent Vitals/Pain Pain Assessment: 0-10 Pain Score: 5  Pain Location: R knee/thigh Pain Descriptors / Indicators: Aching;Sore Pain Intervention(s): Limited activity within patient's tolerance;Monitored during session;Premedicated before session;Ice applied    Home Living                      Prior Function            PT Goals (current goals can now be found in the care plan section) Acute Rehab PT Goals Patient Stated Goal: Regain IND and walk with less pain PT Goal Formulation: With patient Time For Goal Achievement: 11/24/15 Potential to Achieve Goals: Good Progress towards PT goals: Progressing toward goals    Frequency  7X/week    PT Plan Current plan remains appropriate    Co-evaluation             End of Session Equipment Utilized During Treatment: Gait belt Activity Tolerance: Patient limited by fatigue;Patient tolerated treatment well Patient left: in bed;with call bell/phone within reach;with family/visitor present;with bed alarm set     Time: [id number] PT Time Calculation (min) (ACUTE ONLY): 27 min  Charges:  $Gait Training: 8-22 mins $Therapeutic  Exercise: 8-22 mins                    G Codes:      Harika Laidlaw Dec 05, 2015, 12:21 PM

## 2015-11-21 NOTE — Progress Notes (Signed)
   Subjective: 2 Days Post-Op Procedure(s) (LRB): TOTAL KNEE ARTHROPLASTY (Right) Patient reports pain as mild and moderate.   Patient seen in rounds with Dr. Wynelle Link.  Better today. Patient is well, but has had some minor complaints of pain in the knee, requiring pain medications Patient is ready to go home  Objective: Vital signs in last 24 hours: Temp:  [97.8 F (36.6 C)-99 F (37.2 C)] 98.6 F (37 C) (03/29 0550) Pulse Rate:  [66-80] 80 (03/29 0550) Resp:  [16-18] 16 (03/29 0550) BP: (128-168)/(54-66) 129/56 mmHg (03/29 0550) SpO2:  [97 %-99 %] 98 % (03/29 0550)  Intake/Output from previous day:  Intake/Output Summary (Last 24 hours) at 11/21/15 0924 Last data filed at 11/21/15 0900  Gross per 24 hour  Intake   1320 ml  Output   2700 ml  Net  -1380 ml    Intake/Output this shift: Total I/O In: 240 [P.O.:240] Out: -   Labs:  Recent Labs  11/20/15 0414 11/21/15 0401  HGB 11.5* 11.2*    Recent Labs  11/20/15 0414 11/21/15 0401  WBC 14.0* 16.1*  RBC 3.79* 3.75*  HCT 34.6* 34.2*  PLT 270 287    Recent Labs  11/20/15 0414 11/21/15 0401  NA 141 138  K 4.0 3.6  CL 103 99*  CO2 27 28  BUN 23* 20  CREATININE 0.83 0.70  GLUCOSE 137* 119*  CALCIUM 8.7* 8.6*   No results for input(s): LABPT, INR in the last 72 hours.  EXAM: General - Patient is Alert, Appropriate and Oriented Extremity - Neurovascular intact Sensation intact distally Dorsiflexion/Plantar flexion intact Incision - clean, dry, no drainage Motor Function - intact, moving foot and toes well on exam.   Assessment/Plan: 2 Days Post-Op Procedure(s) (LRB): TOTAL KNEE ARTHROPLASTY (Right) Procedure(s) (LRB): TOTAL KNEE ARTHROPLASTY (Right) Past Medical History  Diagnosis Date  . Chronic kidney disease 02-25-12    kidney stones- litho's x2  . Hypertension   . Depression   . Sleep apnea 02-25-12    uses cpap since '00  . Arthritis 02-25-12    arthritis-neck, fingers, hips, knees  .  Transfusion history 02-25-12    After last C-Section '81  . Dysrhythmia 2006    hx. A. Fib, no recent problems -rate control-NSR  at present  . History of skin cancer   . Anxiety    Principal Problem:   OA (osteoarthritis) of knee  Estimated body mass index is 36.7 kg/(m^2) as calculated from the following:   Height as of this encounter: 5' 5.5" (1.664 m).   Weight as of this encounter: 101.606 kg (224 lb). Up with therapy Discharge home with home health Diet - Cardiac diet Follow up - in 2 weeks Activity - WBAT Disposition - Home Condition Upon Discharge - Good D/C Meds - See DC Summary DVT Prophylaxis - Xarelto  [name], PA-C Orthopaedic Surgery 11/21/2015, 9:24 AM

## 2015-11-21 NOTE — Progress Notes (Signed)
RN reviewed discharge instructions with patient and family. All question answered.   Paperwork and prescriptions given.   NT rolled patient down with all belongings to family car.

## 2015-11-21 NOTE — Progress Notes (Signed)
Occupational Therapy Treatment Patient Details Name: Shannon Aguilar MRN: [id number] DOB: July 11, 1946 Today's Date: 11/21/2015    History of present illness R TKR   OT comments  All education completed and pt questions answered. No further OT needs. OT will sign off.  Follow Up Recommendations  No OT follow up;Supervision/Assistance - 24 hour    Equipment Recommendations  3 in 1 bedside comode    Recommendations for Other Services      Precautions / Restrictions Precautions Precautions: Knee;Fall Restrictions Weight Bearing Restrictions: No Other Position/Activity Restrictions: WBAT       Mobility Bed Mobility Overal bed mobility: Needs Assistance Bed Mobility: Supine to Sit     Supine to sit: Supervision        Transfers Overall transfer level: Needs assistance Equipment used: Rolling walker (2 wheeled) Transfers: Sit to/from Stand Sit to Stand: Supervision              Balance                                   ADL Overall ADL's : Needs assistance/impaired              Upper Body Dressing : Set up;Sitting   Lower Body Dressing: Minimal assistance;Sit to/from stand             Functional mobility during ADLs: Supervision/safety;Rolling walker General ADL Comments: Educated on LB dressing techniques. Demonstrated tub transfer technique to patient. She reports she will sponge bathe until confident about tub transfer.       Vision                     Perception     Praxis      Cognition   Behavior During Therapy: WFL for tasks assessed/performed Overall Cognitive Status: Within Functional Limits for tasks assessed                       Extremity/Trunk Assessment               Exercises     Shoulder Instructions       General Comments      Pertinent Vitals/ Pain       Pain Assessment: 0-10 Pain Score: 5  Pain Location: R knee/thigh Pain Descriptors / Indicators: Aching;Sore Pain  Intervention(s): Limited activity within patient's tolerance;Monitored during session;Repositioned  Home Living                                          Prior Functioning/Environment              Frequency Min 2X/week     Progress Toward Goals  OT Goals(current goals can now be found in the care plan section)  Progress towards OT goals: Goals met/education completed, patient discharged from OT  Acute Rehab OT Goals Patient Stated Goal: Regain IND and walk with less pain  Plan All goals met and education completed, patient discharged from OT services    Co-evaluation                 End of Session    Activity Tolerance Patient tolerated treatment well   Patient Left in bed;with call bell/phone within reach   Nurse Communication Mobility status  Time: 3785-8850 OT Time Calculation (min): 14 min  Charges: OT General Charges $OT Visit: 1 Procedure OT Treatments $Self Care/Home Management : 8-22 mins  Sota Hetz A 11/21/2015, 11:45 AM

## 2016-05-17 ENCOUNTER — Encounter (HOSPITAL_COMMUNITY): Payer: Self-pay | Admitting: Family Medicine

## 2016-05-17 ENCOUNTER — Ambulatory Visit (HOSPITAL_COMMUNITY)
Admission: EM | Admit: 2016-05-17 | Discharge: 2016-05-17 | Disposition: A | Discharge status: Home / Self Care | Payer: BC Managed Care – PPO | Attending: Internal Medicine | Admitting: Internal Medicine

## 2016-05-17 DIAGNOSIS — R002 Palpitations: Secondary | ICD-10-CM | POA: Diagnosis not present

## 2016-05-17 DIAGNOSIS — Z8679 Personal history of other diseases of the circulatory system: Secondary | ICD-10-CM

## 2016-05-17 NOTE — Discharge Instructions (Signed)
Either reschedule your cardiology appointment for sooner or you may call [facility] and state you were seen here and request an appointment.

## 2016-05-17 NOTE — ED Provider Notes (Signed)
CSN: [id number]     Arrival date & time 05/17/16  1628 History   First MD Initiated Contact with Patient 05/17/16 1847     Chief Complaint  Patient presents with  . Tachycardia   (Consider location/radiation/quality/duration/timing/severity/associated sxs/prior Treatment)  HPI   The patient is a 70 year old female presenting today with complaints of a tachycardic episode that occurred earlier this afternoon. Patient states it lasted approximately 20 minutes and her heart rate sustained at approximately 142 bpm. Patient states she does have a history of atrial fibrillation with intermittent episodes starting 14 years ago. Most recent episode that she can recall was approximately 6 years ago. Patient since then states that she has decreased her caffeine with good results and has only intermittent palpitations. Patient states during the incident today she felt some shortness of breath and some discomfort or pressure bilaterally on her neck below both ears. Patient denies any dizziness, headache, nausea, sweating, weakness, or swelling of ankles and feet. States she was on Coumadin for approximately 3-4 years for her A. fib but has since been placed on a baby aspirin daily with good results. She was referred by her primary care provider to a cardiologist for evaluation of palpitations and has an appointment towards the end of October.   Past Medical History:  Diagnosis Date  . Anxiety   . Arthritis 02-25-12   arthritis-neck, fingers, hips, knees  . Chronic kidney disease 02-25-12   kidney stones- litho's x2  . Depression   . Dysrhythmia 2006   hx. A. Fib, no recent problems -rate control-NSR  at present  . History of skin cancer   . Hypertension   . Sleep apnea 02-25-12   uses cpap since '00  . Transfusion history 02-25-12   After last C-Section '81   Past Surgical History:  Procedure Laterality Date  . CESAREAN SECTION  02-25-12   x2   . DILATION AND CURETTAGE OF UTERUS  02-25-12   abnormal  vaginal bleeding- ? 5 yrs ago  . KNEE ARTHROSCOPY  03/03/2012   Procedure: ARTHROSCOPY KNEE;  Surgeon: Gearlean Alf, MD;  Location: [facility];  Service: Orthopedics;  Laterality: Right;  Right Knee Arthroscopy, medial meniscectomy and chrondroplasty  . LITHOTRIPSY  02-25-12   x2   . TONSILLECTOMY  02-25-12   child  . TOTAL KNEE ARTHROPLASTY Right 11/19/2015   Procedure: TOTAL KNEE ARTHROPLASTY;  Surgeon: Gaynelle Arabian, MD;  Location: [facility];  Service: Orthopedics;  Laterality: Right;   Family History  Problem Relation Age of Onset  . Hypertension Mother   . Arthritis Mother   . Heart disease Father   . Prostate cancer Father   . Diabetes Father   . Hypertension Brother   . Hypertension Brother   . Hypothyroidism Sister   . Hypercholesterolemia Sister    Social History  Substance Use Topics  . Smoking status: Former Smoker    Quit date: 02/25/1991  . Smokeless tobacco: Not on file  . Alcohol use No     Comment: rare- social   OB History    No data available     Review of Systems  Constitutional: Negative.  Negative for diaphoresis, fatigue and fever.  HENT: Negative.  Negative for congestion.   Eyes: Negative.  Negative for visual disturbance.  Respiratory: Positive for shortness of breath. Negative for cough, chest tightness and wheezing.        Was SOB during episode.  Currently denies SOB.  Cardiovascular: Negative.  Negative for chest pain and  leg swelling.  Gastrointestinal: Negative.  Negative for diarrhea, nausea and vomiting.  Endocrine: Negative.   Genitourinary: Negative.   Musculoskeletal: Negative.  Negative for gait problem and neck stiffness.  Skin: Negative.   Allergic/Immunologic: Negative.   Neurological: Negative.  Negative for dizziness, weakness and headaches.  Hematological: Negative.   Psychiatric/Behavioral: Negative.     Allergies  Milk-related compounds and Penicillins  Home Medications   Prior to Admission medications   Medication Sig Start  Date End Date Taking? Authorizing Provider  citalopram (CELEXA) 20 MG tablet Take 20 mg by mouth daily.    Historical Provider, MD  Pitavastatin Calcium (LIVALO) 2 MG TABS Take 2 mg by mouth at bedtime.    Historical Provider, MD  Polyethyl Glycol-Propyl Glycol (SYSTANE OP) Apply 1 drop to eye 2 (two) times daily as needed (for dry eyes). Dry eyes    Historical Provider, MD  triamterene-hydrochlorothiazide (MAXZIDE) 75-50 MG tablet Take 1 tablet by mouth daily.    Historical Provider, MD   Meds Ordered and Administered this Visit  Medications - No data to display  BP 130/72   Pulse 81   Temp 98.5 F (36.9 C) (Oral)   Resp 18   SpO2 95%  No data found.   Physical Exam  Constitutional: She appears well-developed and well-nourished. No distress.  Eyes: Conjunctivae are normal. Pupils are equal, round, and reactive to light. Right eye exhibits no discharge. Left eye exhibits no discharge. No scleral icterus.  Neck: Normal range of motion. Neck supple. No thyromegaly present.  Cardiovascular: Normal rate, normal heart sounds and intact distal pulses.  Exam reveals no gallop and no friction rub.   No murmur heard. Intermittent irregular ectopic beats noted, but HR is normal rate.   Pulmonary/Chest: Effort normal and breath sounds normal. No respiratory distress. She has no wheezes. She has no rales. She exhibits no tenderness.  Skin: Capillary refill takes less than 2 seconds. She is not diaphoretic.  Nursing note and vitals reviewed.   Urgent Care Course   Clinical Course   EKG shows normal sinus rhythm with ventricular rate of 69 bpm.  PR interval is 146 ms  QRS duration is 82 ms  QT/QTc is 420/450 ms PRT axes are 70 66 62   Procedures (including critical care time)  Labs Review Labs Reviewed - No data to display  Imaging Review No results found.  Discussed with patient she may move her cardiology referral appointment up sooner or follow up with [facility] cardiology as she  prefers.  MDM   1. Heart palpitations   2. History of atrial fibrillation    The usual and customary discharge instructions and warnings were given.  The patient verbalizes understanding and agrees to plan of care.       Nehemiah Settle, NP 05/17/16 1919

## 2016-05-17 NOTE — ED Triage Notes (Signed)
Pt here for evaluation of 20 minute episode of tachycardia today. sts she was worried and came here. sts that she was a little SOB. currently pt heart rate in the 70s and no complaints.

## 2016-06-05 ENCOUNTER — Ambulatory Visit: Payer: Self-pay | Admitting: Orthopedic Surgery

## 2016-06-18 ENCOUNTER — Encounter (INDEPENDENT_AMBULATORY_CARE_PROVIDER_SITE_OTHER): Payer: Self-pay

## 2016-06-18 ENCOUNTER — Ambulatory Visit (INDEPENDENT_AMBULATORY_CARE_PROVIDER_SITE_OTHER): Payer: Medicare PPO | Admitting: Interventional Cardiology

## 2016-06-18 ENCOUNTER — Encounter: Payer: Self-pay | Admitting: Interventional Cardiology

## 2016-06-18 VITALS — BP 130/70 | HR 73 | Ht 65.5 in | Wt 216.8 lb

## 2016-06-18 DIAGNOSIS — E782 Mixed hyperlipidemia: Secondary | ICD-10-CM | POA: Diagnosis not present

## 2016-06-18 DIAGNOSIS — R002 Palpitations: Secondary | ICD-10-CM | POA: Diagnosis not present

## 2016-06-18 DIAGNOSIS — I10 Essential (primary) hypertension: Secondary | ICD-10-CM | POA: Diagnosis not present

## 2016-06-18 DIAGNOSIS — I480 Paroxysmal atrial fibrillation: Secondary | ICD-10-CM | POA: Diagnosis not present

## 2016-06-18 NOTE — Progress Notes (Signed)
Cardiology Office Note   Date:  06/18/2016   ID:  Shannon Aguilar, DOB 19-Jan-1946, MRN [id number]  PCP:  Gara Kroner, MD    No chief complaint on file.  palpitations  Wt Readings from Last 3 Encounters:  06/18/16 98.3 kg (216 lb 12.8 oz)  11/19/15 101.6 kg (224 lb)  11/12/15 101.6 kg (224 lb)       History of Present Illness: Shannon Aguilar is a 70 y.o. female  Who needs TKR.  She has had palpitations intermittently.  THey typically occur at night.  The worst episode was on 05/17/16.  It lasted 30 minutes.  HR up to 149 once after a cold, but it resolved on its own before she got to urgent care.    THe night time palpitations feel like a brief fluttering, up to 15 minutes.  This has decreased since she decreased caffeine.  Still happens 2-3 x/week.    She exercises at the [facility].  She does cardio and weights.  No sx with that.    No CP, DOE.  No diaphoresis or nausea.    In 2006, she had a cardiac eval.  She had PAF at that time.  Echo and stress test were done at that time.  W/u was negative.  That occurred after a trip to [place].    Past Medical History:  Diagnosis Date  . Anxiety   . Arthritis 02-25-12   arthritis-neck, fingers, hips, knees  . Chronic kidney disease 02-25-12   kidney stones- litho's x2  . Depression   . Dysrhythmia 2006   hx. A. Fib, no recent problems -rate control-NSR  at present  . History of skin cancer   . Hypertension   . Sleep apnea 02-25-12   uses cpap since '00  . Transfusion history 02-25-12   After last C-Section '81    Past Surgical History:  Procedure Laterality Date  . CESAREAN SECTION  02-25-12   x2   . DILATION AND CURETTAGE OF UTERUS  02-25-12   abnormal vaginal bleeding- ? 5 yrs ago  . KNEE ARTHROSCOPY  03/03/2012   Procedure: ARTHROSCOPY KNEE;  Surgeon: Gearlean Alf, MD;  Location: [facility];  Service: Orthopedics;  Laterality: Right;  Right Knee Arthroscopy, medial meniscectomy and chrondroplasty  . LITHOTRIPSY  02-25-12   x2    . TONSILLECTOMY  02-25-12   child  . TOTAL KNEE ARTHROPLASTY Right 11/19/2015   Procedure: TOTAL KNEE ARTHROPLASTY;  Surgeon: Gaynelle Arabian, MD;  Location: [facility];  Service: Orthopedics;  Laterality: Right;     Current Outpatient Prescriptions  Medication Sig Dispense Refill  . amLODipine (NORVASC) 2.5 MG tablet Take 2.5 mg by mouth daily.     . citalopram (CELEXA) 20 MG tablet Take 20 mg by mouth daily.    . Pitavastatin Calcium (LIVALO) 2 MG TABS Take 2 mg by mouth at bedtime.    Vladimir Faster Glycol-Propyl Glycol (SYSTANE OP) Apply 1 drop to eye 2 (two) times daily as needed (for dry eyes). Dry eyes    . triamterene-hydrochlorothiazide (MAXZIDE) 75-50 MG tablet Take 1 tablet by mouth daily.     No current facility-administered medications for this visit.     Allergies:   Milk-related compounds and Penicillins    Social History:  The patient  reports that she quit smoking about 25 years ago. She does not have any smokeless tobacco history on file. She reports that she does not drink alcohol or use drugs.   Family History:  The patient's family history includes Arthritis in her mother; Diabetes in her father; Heart attack in her father and mother; Heart disease in her father; Hypercholesterolemia in her sister; Hypertension in her brother, brother, and mother; Hypothyroidism in her sister; Prostate cancer in her father.    ROS:  Please see the history of present illness.   Otherwise, review of systems are positive for palpitations.   All other systems are reviewed and negative.    PHYSICAL EXAM: VS:  BP 130/70   Pulse 73   Ht 5' 5.5" (1.664 m)   Wt 98.3 kg (216 lb 12.8 oz)   LMP  (Exact Date)   SpO2 96%   BMI 35.53 kg/m  , BMI Body mass index is 35.53 kg/m. GEN: Well nourished, well developed, in no acute distress  HEENT: normal  Neck: no JVD, carotid bruits, or masses Cardiac: RRR; no murmurs, rubs, or gallops,no edema  Respiratory:  clear to auscultation bilaterally,  normal work of breathing GI: soft, nontender, nondistended, + BS MS: no deformity or atrophy  Skin: warm and dry, no rash Neuro:  Strength and sensation are intact Psych: euthymic mood, full affect   EKG:   The ekg ordered 05/17/16 demonstrates NSR, no ST segment changes   Recent Labs: 11/12/2015: ALT 38 11/21/2015: BUN 20; Creatinine, Ser 0.70; Hemoglobin 11.2; Platelets 287; Potassium 3.6; Sodium 138   Lipid Panel No results found for: CHOL, TRIG, HDL, CHOLHDL, VLDL, LDLCALC, LDLDIRECT   Other studies Reviewed: Additional studies/ records that were reviewed today with results demonstrating: Prior testing results done in 2006 unavailable.   ASSESSMENT AND PLAN:   1. Preoperative eval: She has knee surgery planned in December. No ischemia workup needed at this time before knee surgery. We'll plan for 30 day event monitor to see what her palpitations could be. 2. AFib: Paroxysmal atrial fibrillation about 10 years ago. Negative workup at that time. No further episodes documented.  Await monitor results.  No long term anticoagulation started in 2006.  Await current w/u. 3. Hypertension: Blood pressure well controlled. Continue current medicines. 4. Hyperlipidemia: Continue current lipid-lowering therapy.   Current medicines are reviewed at length with the patient today.  The patient concerns regarding her medicines were addressed.  The following changes have been made:  No change  Labs/ tests ordered today include:  No orders of the defined types were placed in this encounter.   Recommend 150 minutes/week of aerobic exercise Low fat, low carb, high fiber diet recommended  Disposition:   FU based on monitor results   Signed, Larae Grooms, MD  06/18/2016 2:23 PM    [facility] [address], [place], [place]  [address] Phone: [phone]; Fax: [phone]

## 2016-06-18 NOTE — Patient Instructions (Signed)
**Note De-Identified Ramal Eckhardt Obfuscation** Medication Instructions:  Same-no change  Labwork: None  Testing/Procedures: Your physician has recommended that you wear an event monitor. Event monitors are medical devices that record the heart's electrical activity. Doctors most often [place] these monitors to diagnose arrhythmias. Arrhythmias are problems with the speed or rhythm of the heartbeat. The monitor is a small, portable device. You can wear one while you do your normal daily activities. This is usually used to diagnose what is causing palpitations/syncope (passing out).   Follow-Up: Based on results.     If you need a refill on your cardiac medications before your next appointment, please call your pharmacy.

## 2016-06-26 ENCOUNTER — Ambulatory Visit (INDEPENDENT_AMBULATORY_CARE_PROVIDER_SITE_OTHER): Payer: Medicare PPO

## 2016-06-26 ENCOUNTER — Encounter (INDEPENDENT_AMBULATORY_CARE_PROVIDER_SITE_OTHER): Payer: Self-pay

## 2016-06-26 DIAGNOSIS — R002 Palpitations: Secondary | ICD-10-CM

## 2016-07-02 ENCOUNTER — Telehealth: Payer: Self-pay | Admitting: Pediatrics

## 2016-07-02 NOTE — Telephone Encounter (Signed)
Would stop aspirin and start Eliquis 5 mg BID for stroke prevention.  Based on gender and age alone, her CHADS score will be high enough to warrant anticoagulation. No bleeding history noted.  Please double check with the patient if she has had any significant bleeding history such as GI bleed or intracranial hemorrhage.

## 2016-07-02 NOTE — Telephone Encounter (Signed)
We rec'd a transmission from pt's monitor.  It revealed AFib around 0300 this am.  I spoke with patient. She states the palpitations woke her up and c/o having slight headache during episode. She states the symptoms only lasted a few minutes.  She admits to taking ASA 325 QPM, however, forgot dose on 11/7. She admits to riding bike in gym for 10 minutes and doing leg exercises yesterday. I advised her I would cb after speaking w/ physician.  I spoke with Dr. Marlou Porch (DOD). He states pt should be seen soon to begin anticoag therapy, even with upcoming surgery.  I called patient back and advised her of the above. I sch her an appt with Estella Husk, PA-C for Monday 07/07/16 @ 0815.  I advised pt to continue ASA and send transmission if symptoms occur again.  I advised ED precautions. She voiced understanding and thanks.

## 2016-07-03 MED ORDER — APIXABAN 5 MG PO TABS: 5.0000 mg | ORAL_TABLET | Freq: Two times a day (BID) | ORAL | 3 refills | Status: DC

## 2016-07-03 NOTE — Telephone Encounter (Signed)
**Note De-Identified Shaun Runyon Obfuscation** The pt states that she has no history of bleeding. She is in agreement with stopping Asa and starting Eliquis 5 mg BID. Per her request I have sent Eliquis RX to [facility] on [address] Rush Barer with 3 refills.

## 2016-07-03 NOTE — Telephone Encounter (Signed)
Left message to call back  

## 2016-07-07 ENCOUNTER — Encounter: Payer: Self-pay | Admitting: Physician Assistant

## 2016-07-07 ENCOUNTER — Ambulatory Visit (INDEPENDENT_AMBULATORY_CARE_PROVIDER_SITE_OTHER): Payer: Medicare PPO | Admitting: Physician Assistant

## 2016-07-07 VITALS — BP 132/70 | HR 70 | Ht 65.0 in | Wt 222.0 lb

## 2016-07-07 DIAGNOSIS — E669 Obesity, unspecified: Secondary | ICD-10-CM | POA: Insufficient documentation

## 2016-07-07 DIAGNOSIS — E785 Hyperlipidemia, unspecified: Secondary | ICD-10-CM

## 2016-07-07 DIAGNOSIS — Z7901 Long term (current) use of anticoagulants: Secondary | ICD-10-CM

## 2016-07-07 DIAGNOSIS — I480 Paroxysmal atrial fibrillation: Secondary | ICD-10-CM

## 2016-07-07 DIAGNOSIS — Z6836 Body mass index (BMI) 36.0-36.9, adult: Secondary | ICD-10-CM

## 2016-07-07 DIAGNOSIS — I10 Essential (primary) hypertension: Secondary | ICD-10-CM | POA: Diagnosis not present

## 2016-07-07 DIAGNOSIS — E6609 Other obesity due to excess calories: Secondary | ICD-10-CM

## 2016-07-07 MED ORDER — METOPROLOL TARTRATE 25 MG PO TABS: 25.0000 mg | ORAL_TABLET | Freq: Two times a day (BID) | ORAL | 3 refills | Status: DC

## 2016-07-07 NOTE — Progress Notes (Signed)
Cardiology Office Note    Date:  07/07/2016   ID:  Shannon Aguilar, DOB March 15, 1946, MRN [id number]  PCP:  Gara Kroner, MD  Cardiologist: Dr. Irish Lack   Chief Complaint  Patient presents with  . Palpitations    History of Present Illness:  Shannon Aguilar is a 70 y.o. female patient seen by Dr. Irish Lack 06/18/16 for preoperative clearance for TKR. She has a history of PAF in 2006 and was on coumadin for a couple of years. She had a negative w/u with stress test and echo at that time. She has been complaining of palpitations and Holter monitor was place. She has documented episode of afib at 150/m and was started on Eliquis for Chadsvasc of 3.  Patient comes in today feeling well. She was having palpitations every night but the episodes are happening less frequently. She did cut out caffeine. She's asking to be referred to a nutritionist but wants to wait until after her knee replacement.     Past Medical History:  Diagnosis Date  . Anxiety   . Arthritis 02-25-12   arthritis-neck, fingers, hips, knees  . Chronic kidney disease 02-25-12   kidney stones- litho's x2  . Depression   . Dysrhythmia 2006   hx. A. Fib, no recent problems -rate control-NSR  at present  . History of skin cancer   . Hypertension   . Sleep apnea 02-25-12   uses cpap since '00  . Transfusion history 02-25-12   After last C-Section '81    Past Surgical History:  Procedure Laterality Date  . CESAREAN SECTION  02-25-12   x2   . DILATION AND CURETTAGE OF UTERUS  02-25-12   abnormal vaginal bleeding- ? 5 yrs ago  . KNEE ARTHROSCOPY  03/03/2012   Procedure: ARTHROSCOPY KNEE;  Surgeon: Gearlean Alf, MD;  Location: [facility];  Service: Orthopedics;  Laterality: Right;  Right Knee Arthroscopy, medial meniscectomy and chrondroplasty  . LITHOTRIPSY  02-25-12   x2   . TONSILLECTOMY  02-25-12   child  . TOTAL KNEE ARTHROPLASTY Right 11/19/2015   Procedure: TOTAL KNEE ARTHROPLASTY;  Surgeon: Gaynelle Arabian, MD;  Location:  [facility];  Service: Orthopedics;  Laterality: Right;    Current Medications: Outpatient Medications Prior to Visit  Medication Sig Dispense Refill  . amLODipine (NORVASC) 2.5 MG tablet Take 2.5 mg by mouth daily.     Marland Kitchen apixaban (ELIQUIS) 5 MG TABS tablet Take 1 tablet (5 mg total) by mouth 2 (two) times daily. 60 tablet 3  . citalopram (CELEXA) 20 MG tablet Take 20 mg by mouth daily.    . Pitavastatin Calcium (LIVALO) 2 MG TABS Take 2 mg by mouth at bedtime.    Vladimir Faster Glycol-Propyl Glycol (SYSTANE OP) Apply 1 drop to eye 2 (two) times daily as needed (for dry eyes). Dry eyes    . triamterene-hydrochlorothiazide (MAXZIDE) 75-50 MG tablet Take 1 tablet by mouth daily.     No facility-administered medications prior to visit.      Allergies:   Milk-related compounds and Penicillins   Social History   Social History  . Marital status: Married    Spouse name: N/A  . Number of children: N/A  . Years of education: N/A   Social History Main Topics  . Smoking status: Former Smoker    Quit date: 02/25/1991  . Smokeless tobacco: Never Used  . Alcohol use No     Comment: rare- social  . Drug use: No  . Sexual activity:  Yes   Other Topics Concern  . None   Social History Narrative  . None     Family History:  The patient's family history includes Arthritis in her mother; Diabetes in her father; Heart attack in her father and mother; Heart disease in her father; Hypercholesterolemia in her sister; Hypertension in her brother, brother, and mother; Hypothyroidism in her sister; Prostate cancer in her father.   ROS:   Please see the history of present illness.    Review of Systems  Constitution: Negative.  HENT: Negative.   Eyes: Negative.   Cardiovascular: Negative.   Respiratory: Negative.   Hematologic/Lymphatic: Negative.   Musculoskeletal: Negative.  Negative for joint pain.  Gastrointestinal: Negative.   Genitourinary: Negative.   Neurological: Negative.    All other  systems reviewed and are negative.   PHYSICAL EXAM:   VS:  BP 132/70   Pulse 70   Ht 5\' 5"  (1.651 m)   Wt 222 lb (100.7 kg)   LMP  (Exact Date)   BMI 36.94 kg/m   Physical Exam  GEN: Well nourished, well developed, in no acute distress  Neck: no JVD, carotid bruits, or masses Cardiac:RRR; no murmurs, rubs, or gallops  Respiratory:  clear to auscultation bilaterally, normal work of breathing GI: soft, nontender, nondistended, + BS Ext: without cyanosis, clubbing, or edema, Good distal pulses bilaterally MS: no deformity or atrophy  Skin: warm and dry, no rash Psych: euthymic mood, full affect  Wt Readings from Last 3 Encounters:  07/07/16 222 lb (100.7 kg)  06/18/16 216 lb 12.8 oz (98.3 kg)  11/19/15 224 lb (101.6 kg)      Studies/Labs Reviewed:   EKG:  EKG is not ordered today.   Recent Labs: 11/12/2015: ALT 38 11/21/2015: BUN 20; Creatinine, Ser 0.70; Hemoglobin 11.2; Platelets 287; Potassium 3.6; Sodium 138   Lipid Panel No results found for: CHOL, TRIG, HDL, CHOLHDL, VLDL, LDLCALC, LDLDIRECT  Additional studies/ records that were reviewed today include:   holter monitor results reviewed and she had afib at 150/m for 1 minute.   ASSESSMENT:    1. Paroxysmal atrial fibrillation (HCC)   2. Current use of long term anticoagulation   3. Essential hypertension   4. Hyperlipidemia, unspecified hyperlipidemia type   5. Class 2 obesity due to excess calories with body mass index (BMI) of 36.0 to 36.9 in adult, unspecified whether serious comorbidity present      PLAN:  In order of problems listed above:  PAF documented on Holter monitor with rates up to 150 bpm. We'll begin metoprolol 25 mg twice a day. Patient is to check her blood pressures at home to see if we need to decrease any of her other medications. We'll also continue to wear monitor to see if we need to adjust her beta blocker. Will have TSH checked with surgical labs. F/U with Dr. Irish Lack in 4-6 weeks.  Consider 2Decho in future.   Current use of long-term anticoagulation. Eliquis started. Patient has no history of bleeding problems. To have labs drawn next week for presurgical.  Essential hypertension controlled with Norvasc and Maxzide. May need to decrease or stop Norvasc since we are starting metoprolol. Patient will monitor blood pressures at home and call us.  Hyperlipidemia on Pitavistatin. Lipids checked in March by primary care and said to be stable.   Obesity patient would like to see a nutritionist but wants to wait until after her knee replacement.   Medication Adjustments/Labs and Tests Ordered: Current medicines are  reviewed at length with the patient today.  Concerns regarding medicines are outlined above.  Medication changes, Labs and Tests ordered today are listed in the Patient Instructions below. Patient Instructions  Medication Instructions:  Your physician has recommended you make the following change in your medication:  1. Start Lopressor, Metoprolol (25 mg ) twice daily.    Labwork: -None  Testing/Procedures: -None  Follow-Up: Your physician recommends that you keep your scheduled  follow-up appointment with Dr. Irish Lack.    Any Other Special Instructions Will Be Listed Below (If Applicable). Please keep a check on your bp and call in one week with bp readings @ [phone]. Script was given to patient today to add blood work for tsh.      If you need a refill on your cardiac medications before your next appointment, please call your pharmacy.      Sumner Boast, PA-C  07/07/2016 9:07 AM    [facility] [address], [place], [place]  [address] Phone: [phone]; Fax: [phone]

## 2016-07-07 NOTE — Patient Instructions (Signed)
Medication Instructions:  Your physician has recommended you make the following change in your medication:  1. Start Lopressor, Metoprolol (25 mg ) twice daily.    Labwork: -None  Testing/Procedures: -None  Follow-Up: Your physician recommends that you keep your scheduled  follow-up appointment with Dr. Irish Lack.    Any Other Special Instructions Will Be Listed Below (If Applicable). Please keep a check on your bp and call in one week with bp readings @ [phone]. Script was given to patient today to add blood work for tsh.      If you need a refill on your cardiac medications before your next appointment, please call your pharmacy.

## 2016-07-14 ENCOUNTER — Encounter: Payer: Self-pay | Admitting: Physician Assistant

## 2016-07-21 NOTE — Patient Instructions (Addendum)
Shannon Aguilar  07/21/2016   Your procedure is scheduled on: MONDAY 07/28/2016  Report to [facility] Main  Entrance take [place]  elevators to 3rd floor to  [place] at  [place]  AM.  Call this number if you have problems the morning of surgery [phone]   Remember: ONLY 1 PERSON MAY GO WITH YOU TO SHORT STAY TO GET  READY MORNING OF [place].    Do not eat food or drink liquids :After Midnight.     Take these medicines the morning of surgery with A SIP OF WATER: Metoprolol, Amlodipine,  use eye drops if needed, cipro                                 You may not have any metal on your body including hair pins and              piercings  Do not wear jewelry, make-up, lotions, powders or perfumes, deodorant             Do not wear nail polish.  Do not shave  48 hours prior to surgery.              Men may shave face and neck.   Do not bring valuables to the hospital. [place].  Contacts, dentures or bridgework may not be worn into surgery.  Leave suitcase in the car. After surgery it may be brought to your room.                  Please read over the following fact sheets you were given: _____________________________________________________________________             [facility] - Preparing for Surgery Before surgery, you can play an important role.  Because skin is not sterile, your skin needs to be as free of germs as possible.  You can reduce the number of germs on your skin by washing with CHG (chlorahexidine gluconate) soap before surgery.  CHG is an antiseptic cleaner which kills germs and bonds with the skin to continue killing germs even after washing. Please DO NOT use if you have an allergy to CHG or antibacterial soaps.  If your skin becomes reddened/irritated stop using the CHG and inform your nurse when you arrive at Short Stay. Do not shave (including legs and underarms) for at  least 48 hours prior to the first CHG shower.  You may shave your face/neck. Please follow these instructions carefully:  1.  Shower with CHG Soap the night before surgery and the  morning of Surgery.  2.  If you choose to wash your hair, wash your hair first as usual with your  normal  shampoo.  3.  After you shampoo, rinse your hair and body thoroughly to remove the  shampoo.                           4.  Use CHG as you would any other liquid soap.  You can apply chg directly  to the skin and wash  Gently with a scrungie or clean washcloth.  5.  Apply the CHG Soap to your body ONLY FROM THE NECK DOWN.   Do not use on face/ open                           Wound or open sores. Avoid contact with eyes, ears mouth and genitals (private parts).                       Wash face,  Genitals (private parts) with your normal soap.             6.  Wash thoroughly, paying special attention to the area where your surgery  will be performed.  7.  Thoroughly rinse your body with warm water from the neck down.  8.  DO NOT shower/wash with your normal soap after using and rinsing off  the CHG Soap.                9.  Pat yourself dry with a clean towel.            10.  Wear clean pajamas.            11.  Place clean sheets on your bed the night of your first shower and do not  sleep with pets. Day of Surgery : Do not apply any lotions/deodorants the morning of surgery.  Please wear clean clothes to the hospital/surgery center.  FAILURE TO FOLLOW THESE INSTRUCTIONS MAY RESULT IN THE CANCELLATION OF YOUR SURGERY PATIENT SIGNATURE_________________________________  NURSE SIGNATURE__________________________________  ________________________________________________________________________   Adam Phenix  An incentive spirometer is a tool that can help keep your lungs clear and active. This tool measures how well you are filling your lungs with each breath. Taking long deep breaths  may help reverse or decrease the chance of developing breathing (pulmonary) problems (especially infection) following:  A long period of time when you are unable to move or be active. BEFORE THE PROCEDURE   If the spirometer includes an indicator to show your best effort, your nurse or respiratory therapist will set it to a desired goal.  If possible, sit up straight or lean slightly forward. Try not to slouch.  Hold the incentive spirometer in an upright position. INSTRUCTIONS FOR USE  1. Sit on the edge of your bed if possible, or sit up as far as you can in bed or on a chair. 2. Hold the incentive spirometer in an upright position. 3. Breathe out normally. 4. Place the mouthpiece in your mouth and seal your lips tightly around it. 5. Breathe in slowly and as deeply as possible, raising the piston or the ball toward the top of the column. 6. Hold your breath for 3-5 seconds or for as long as possible. Allow the piston or ball to fall to the bottom of the column. 7. Remove the mouthpiece from your mouth and breathe out normally. 8. Rest for a few seconds and repeat Steps 1 through 7 at least 10 times every 1-2 hours when you are awake. Take your time and take a few normal breaths between deep breaths. 9. The spirometer may include an indicator to show your best effort. Use the indicator as a goal to work toward during each repetition. 10. After each set of 10 deep breaths, practice coughing to be sure your lungs are clear. If you have an incision (the cut made at the time of surgery),  support your incision when coughing by placing a pillow or rolled up towels firmly against it. Once you are able to get out of bed, walk around indoors and cough well. You may stop using the incentive spirometer when instructed by your caregiver.  RISKS AND COMPLICATIONS  Take your time so you do not get dizzy or light-headed.  If you are in pain, you may need to take or ask for pain medication before doing  incentive spirometry. It is harder to take a deep breath if you are having pain. AFTER USE  Rest and breathe slowly and easily.  It can be helpful to keep track of a log of your progress. Your caregiver can provide you with a simple table to help with this. If you are using the spirometer at home, follow these instructions: [place] IF:   You are having difficultly using the spirometer.  You have trouble using the spirometer as often as instructed.  Your pain medication is not giving enough relief while using the spirometer.  You develop fever of 100.5 F (38.1 C) or higher. SEEK IMMEDIATE MEDICAL CARE IF:   You cough up bloody sputum that had not been present before.  You develop fever of 102 F (38.9 C) or greater.  You develop worsening pain at or near the incision site. MAKE SURE YOU:   Understand these instructions.  Will watch your condition.  Will get help right away if you are not doing well or get worse. Document Released: 12/22/2006 Document Revised: 11/03/2011 Document Reviewed: 02/22/2007 ExitCare Patient Information 2014 [employer], [place].   ________________________________________________________________________  WHAT IS A BLOOD TRANSFUSION? Blood Transfusion Information  A transfusion is the replacement of blood or some of its parts. Blood is made up of multiple cells which provide different functions.  Red blood cells carry oxygen and are used for blood loss replacement.  White blood cells fight against infection.  Platelets control bleeding.  Plasma helps clot blood.  Other blood products are available for specialized needs, such as hemophilia or other clotting disorders. BEFORE THE TRANSFUSION  Who gives blood for transfusions?   Healthy volunteers who are fully evaluated to make sure their blood is safe. This is blood bank blood. Transfusion therapy is the safest it has ever been in the practice of medicine. Before blood is taken from a  donor, a complete history is taken to make sure that person has no history of diseases nor engages in risky social behavior (examples are intravenous drug use or sexual activity with multiple partners). The donor's travel history is screened to minimize risk of transmitting infections, such as malaria. The donated blood is tested for signs of infectious diseases, such as HIV and hepatitis. The blood is then tested to be sure it is compatible with you in order to minimize the chance of a transfusion reaction. If you or a relative donates blood, this is often done in anticipation of surgery and is not appropriate for emergency situations. It takes many days to process the donated blood. RISKS AND COMPLICATIONS Although transfusion therapy is very safe and saves many lives, the main dangers of transfusion include:   Getting an infectious disease.  Developing a transfusion reaction. This is an allergic reaction to something in the blood you were given. Every precaution is taken to prevent this. The decision to have a blood transfusion has been considered carefully by your caregiver before blood is given. Blood is not given unless the benefits outweigh the risks. AFTER THE TRANSFUSION  Right after receiving a blood transfusion, you will usually feel much better and more energetic. This is especially true if your red blood cells have gotten low (anemic). The transfusion raises the level of the red blood cells which carry oxygen, and this usually causes an energy increase.  The nurse administering the transfusion will monitor you carefully for complications. HOME CARE INSTRUCTIONS  No special instructions are needed after a transfusion. You may find your energy is better. Speak with your caregiver about any limitations on activity for underlying diseases you may have. SEEK MEDICAL CARE IF:   Your condition is not improving after your transfusion.  You develop redness or irritation at the intravenous (IV)  site. SEEK IMMEDIATE MEDICAL CARE IF:  Any of the following symptoms occur over the next 12 hours:  Shaking chills.  You have a temperature by mouth above 102 F (38.9 C), not controlled by medicine.  Chest, back, or muscle pain.  People around you feel you are not acting correctly or are confused.  Shortness of breath or difficulty breathing.  Dizziness and fainting.  You get a rash or develop hives.  You have a decrease in urine output.  Your urine turns a dark color or changes to pink, red, or brown. Any of the following symptoms occur over the next 10 days:  You have a temperature by mouth above 102 F (38.9 C), not controlled by medicine.  Shortness of breath.  Weakness after normal activity.  The white part of the eye turns yellow (jaundice).  You have a decrease in the amount of urine or are urinating less often.  Your urine turns a dark color or changes to pink, red, or brown. Document Released: 08/08/2000 Document Revised: 11/03/2011 Document Reviewed: 03/27/2008 [facility] Patient Information 2014 [place], [place].  _______________________________________________________________________

## 2016-07-22 ENCOUNTER — Encounter (HOSPITAL_COMMUNITY): Payer: Self-pay

## 2016-07-22 ENCOUNTER — Encounter (HOSPITAL_COMMUNITY)
Admission: RE | Admit: 2016-07-22 | Discharge: 2016-07-22 | Disposition: A | Discharge status: Home / Self Care | Payer: Medicare PPO | Source: Ambulatory Visit | Attending: Orthopedic Surgery | Admitting: Orthopedic Surgery

## 2016-07-22 DIAGNOSIS — Z0183 Encounter for blood typing: Secondary | ICD-10-CM | POA: Diagnosis not present

## 2016-07-22 DIAGNOSIS — M1712 Unilateral primary osteoarthritis, left knee: Secondary | ICD-10-CM | POA: Diagnosis not present

## 2016-07-22 DIAGNOSIS — Z01812 Encounter for preprocedural laboratory examination: Secondary | ICD-10-CM | POA: Diagnosis not present

## 2016-07-22 HISTORY — DX: Rash and other nonspecific skin eruption: R21

## 2016-07-22 HISTORY — DX: Prediabetes: R73.03

## 2016-07-22 HISTORY — DX: Personal history of other medical treatment: Z92.89

## 2016-07-22 HISTORY — DX: Pneumonia, unspecified organism: J18.9

## 2016-07-22 LAB — COMPREHENSIVE METABOLIC PANEL
ALBUMIN: 4.3 g/dL (ref 3.5–5.0)
ALT: 45 U/L (ref 14–54)
AST: 33 U/L (ref 15–41)
Alkaline Phosphatase: 77 U/L (ref 38–126)
Anion gap: 7 (ref 5–15)
BUN: 26 mg/dL — AB (ref 6–20)
CHLORIDE: 103 mmol/L (ref 101–111)
CO2: 28 mmol/L (ref 22–32)
CREATININE: 0.96 mg/dL (ref 0.44–1.00)
Calcium: 9.1 mg/dL (ref 8.9–10.3)
GFR calc Af Amer: >60 mL/min (ref >60)
GFR calc non Af Amer: 59 mL/min — ABNORMAL LOW (ref >60)
GLUCOSE: 85 mg/dL (ref 65–99)
POTASSIUM: 4.1 mmol/L (ref 3.5–5.1)
Sodium: 138 mmol/L (ref 135–145)
Total Bilirubin: 1.5 mg/dL — ABNORMAL HIGH (ref 0.3–1.2)
Total Protein: 7.5 g/dL (ref 6.5–8.1)

## 2016-07-22 LAB — URINALYSIS, ROUTINE W REFLEX MICROSCOPIC
BILIRUBIN URINE: NEGATIVE
Glucose, UA: NEGATIVE mg/dL
Hgb urine dipstick: NEGATIVE
KETONES UR: NEGATIVE mg/dL
NITRITE: NEGATIVE
PROTEIN: NEGATIVE mg/dL
Specific Gravity, Urine: 1.005 (ref 1.005–1.030)
pH: 7 (ref 5.0–8.0)

## 2016-07-22 LAB — CBC
HEMATOCRIT: 41 % (ref 36.0–46.0)
Hemoglobin: 13.2 g/dL (ref 12.0–15.0)
MCH: 29.7 pg (ref 26.0–34.0)
MCHC: 32.2 g/dL (ref 30.0–36.0)
MCV: 92.1 fL (ref 78.0–100.0)
PLATELETS: 285 10*3/uL (ref 150–400)
RBC: 4.45 MIL/uL (ref 3.87–5.11)
RDW: 12.8 % (ref 11.5–15.5)
WBC: 10.1 10*3/uL (ref 4.0–10.5)

## 2016-07-22 LAB — SURGICAL PCR SCREEN
MRSA, PCR: NEGATIVE
Staphylococcus aureus: NEGATIVE

## 2016-07-22 LAB — APTT: APTT: 33 s (ref 24–36)

## 2016-07-22 LAB — PROTIME-INR
INR: 1.25
Prothrombin Time: 15.7 seconds — ABNORMAL HIGH (ref 11.4–15.2)

## 2016-07-22 LAB — URINE MICROSCOPIC-ADD ON
Bacteria, UA: NONE SEEN
RBC / HPF: NONE SEEN RBC/hpf (ref 0–5)

## 2016-07-22 LAB — TSH: TSH: 2.532 u[IU]/mL (ref 0.350–4.500)

## 2016-07-23 ENCOUNTER — Telehealth: Payer: Self-pay | Admitting: Interventional Cardiology

## 2016-07-23 ENCOUNTER — Other Ambulatory Visit (HOSPITAL_COMMUNITY): Payer: Self-pay | Admitting: *Deleted

## 2016-07-23 ENCOUNTER — Telehealth: Payer: Self-pay | Admitting: Physician Assistant

## 2016-07-23 NOTE — Telephone Encounter (Signed)
Returning your call. °

## 2016-07-23 NOTE — Telephone Encounter (Signed)
new message   Pt verbalized that she is retuning call fro rn

## 2016-07-23 NOTE — Telephone Encounter (Signed)
New message ° °Pt is returning call  ° °Please call back °

## 2016-07-23 NOTE — Progress Notes (Unsigned)
FAX RECEIVED NO ACTION ON UA MICRO FAXED TO DR Wynelle Link 07-22-16

## 2016-07-23 NOTE — Telephone Encounter (Signed)
Pt aware of her lab results and she verbalized understanding.

## 2016-07-23 NOTE — Telephone Encounter (Signed)
Returned pts call.  Left another message for pt to call back. 

## 2016-07-23 NOTE — Progress Notes (Signed)
EKG 05-17-16 EPIC

## 2016-07-24 LAB — HEMOGLOBIN A1C
HEMOGLOBIN A1C: 5.8 % — AB (ref 4.8–5.6)
MEAN PLASMA GLUCOSE: 120 mg/dL

## 2016-07-25 ENCOUNTER — Other Ambulatory Visit (HOSPITAL_COMMUNITY): Payer: Medicare PPO

## 2016-07-27 ENCOUNTER — Ambulatory Visit: Payer: Self-pay | Admitting: Orthopedic Surgery

## 2016-07-27 NOTE — H&P (Signed)
Shannon Aguilar DOB: 17-Mar-1946 Married / Language: English / Race: White Female Date of Admission:  07/28/2016 CC:  Left Knee Pain History of Present Illness  The patient is a 70 year old female who comes in for a preoperative History and Physical. The patient is scheduled for a left total knee arthroplasty to be performed by Dr. Dione Plover. Aluisio, MD at [facility] on 07-28-2016. The patient is a 70 year old female presenting for a post-operative visit for the right knee. The patient comes in months out from right total knee arthroplasty. The patient states that she is doing very well at this time. The pain is under fair control at this time and describe their pain as mild.  The patient feels that they are progressing well at this time.  Unfortunately, the left knee continues to problematic and she has reached a point where she would like to get the other side replaced. She is doing great in regards to the right knee. She is very pleased with her progress with that. The left knee is what is giving her the most trouble. She has real significant pain in that left knee. It is not as intense as the right knee was prior to surgery, but she does not want to let it get that bad. She said it is bothering her enough and she is having enough functional problems where she would be in favor of pursuing knee replacement now. She has had cortisone and viscosupplement injections without tremendous benefit.  She is ready to proceed with surgery. Risks and benefits of the surgery have been discussed with the patient and they elect to proceed with surgery.  There are on active contraindications to upcoming procedure such as ongoing infection or progressive neurological disease.  Problem List/Past Medical S/P arthroscopy of knee (V45.89)  Primary osteoarthritis of right hip (M16.11)  Primary osteoarthritis of right knee (M17.11)  Achilles tendon sprain [id number])  Status post total right knee replacement  (Z96.651)  Right knee pain (M25.561)  Hip osteoarthritis (M16.9)  Cardiac Arrhythmia  High blood pressure  Sleep Apnea  uses CPAP Chronic Cystitis  Atrial Fibrillation  Depression  Osteoarthritis  Kidney Stone  Hypercholesterolemia  Tinnitus  Cataract  Bronchitis  Hemorrhoids  Urinary Tract Infection  Measles  Mumps  Rubella  Menopause  Urinary tract infection in female (N39.0)   Allergies  Penicillin G Sodium *PENICILLINS*  Rash.  Family History Osteoarthritis  Mother. mother Hypertension  First Degree Relatives. mother and brother Severe allergy  brother Rheumatoid Arthritis  mother Depression  father and brother Congestive Heart Failure  father Heart Disease  mother, father and grandfather fathers side Diabetes Mellitus  mother, father and brother  Social History Alcohol use  current drinker; drinks wine; only occasionally per week Children  1 Current work status  working part time Drug/[facility]cohol [facility] (Currently)  no Drug/Alcohol Rehab (Previously)  no Exercise  Exercises daily; does other and gym / weights Illicit drug use  no Living situation  live with spouse Marital status  married Number of flights of stairs before winded  2-3 Pain Contract  no Tobacco / smoke exposure  no Tobacco use  Former smoker. former smoker Post-Surgical Plans  Home  Medication History Livalo (2MG  Tablet, Oral) Active. (qd) Tylenol (325MG  Tablet, 1 (one) Oral) Active. (PRN) Triamterene-HCTZ (37.5-25MG  Capsule, Oral) Active. (qd) Citalopram Hydrobromide (40MG  Tablet, Oral) Active. (qd) Metoprolol Tartrate (25MG  Tablet, Oral) Active. Eliquis (5MG  Tablet, Oral) Active. AmLODIPine Besylate (2.5MG  Tablet, Oral) Active.  Past Surgical History  Cesarean Delivery  2 times; 1978, 1981 Total Knee Replacement - Right [11/19/2015]: Tonsillectomy  1957   Review of Systems General Not Present- Chills, Fatigue, Fever, Memory  Loss, Night Sweats, Weight Gain and Weight Loss. Skin Not Present- Eczema, Hives, Itching, Lesions and Rash. HEENT Not Present- Dentures, Double Vision, Headache, Hearing Loss, Tinnitus and Visual Loss. Respiratory Present- Cough. Not Present- Allergies, Chronic Cough, Coughing up blood, Shortness of breath at rest and Shortness of breath with exertion. Cardiovascular Present- Palpitations. Not Present- Chest Pain, Difficulty Breathing Lying Down, Murmur, Racing/skipping heartbeats and Swelling. Gastrointestinal Not Present- Abdominal Pain, Bloody Stool, Constipation, Diarrhea, Difficulty Swallowing, Heartburn, Jaundice, Loss of appetitie, Nausea and Vomiting. Female Genitourinary Not Present- Blood in Urine, Discharge, Flank Pain, Incontinence, Painful Urination, Urgency, Urinary frequency, Urinary Retention, Urinating at Night and Weak urinary stream. Musculoskeletal Present- Joint Pain, Joint Swelling and Morning Stiffness. Not Present- Back Pain, Muscle Pain, Muscle Weakness and Spasms. Neurological Not Present- Blackout spells, Difficulty with balance, Dizziness, Paralysis, Tremor and Weakness. Psychiatric Not Present- Insomnia.  Vitals Weight: 220 lb Height: 65in Weight was reported by patient. Height was reported by patient. Body Surface Area: 2.06 m Body Mass Index: 36.61 kg/m  Pulse: 56 (Regular)  BP: 126/60 (Sitting, Right Arm, Standard) Rate controlled with beta-blocker   Physical Exam General Mental Status -Alert, cooperative and good historian. General Appearance-pleasant, Not in acute distress. Orientation-Oriented X3. Build & Nutrition-Well nourished and Well developed.  Head and Neck Head-normocephalic, atraumatic . Neck Global Assessment - supple, no bruit auscultated on the right, no bruit auscultated on the left.  Eye Pupil - Bilateral-Regular and Round. Motion - Bilateral-EOMI.  Chest and Lung Exam Auscultation Breath sounds - clear  at anterior chest wall and clear at posterior chest wall. Adventitious sounds - No Adventitious sounds.  Cardiovascular Auscultation Rhythm - Bradycardic(currently on beta-blocker) and Regular rate and rhythm. Heart Sounds - S1 WNL and S2 WNL. Murmurs & Other Heart Sounds - Auscultation of the heart reveals - No Murmurs.  Abdomen Palpation/Percussion Tenderness - Abdomen is non-tender to palpation. Rigidity (guarding) - Abdomen is soft. Auscultation Auscultation of the abdomen reveals - Bowel sounds normal.  Female Genitourinary Note: Not done, not pertinent to present illness   Musculoskeletal Note: Well-developed female, in no distress. Her right knee looks great. There is no swelling in her right knee. Range of motion in the right knee is about 0 to 122 degrees. There is no tenderness or instability. Left knee varus deformity. Range about 5 to 120. Moderate crepitus on range of motion with tenderness medial greater than lateral with no instability.  RADIOGRAPHS AP and lateral of both knee showed the prosthesis on the right in excellent position with no periprosthetic abnormalities. On the left, she has got bone on bone arthritis in the medial and patellofemoral compartments with slight varus.   Assessment & Plan Status post right knee joint replacement surgery (Z47.1, Z96.651) Primary osteoarthritis of left knee (M17.12)  Note:Surgical Plans: Left Total Knee Replacement  Disposition: Home  PCP: Dr. Antony Contras  IV TXA  Anesthesia Issues: None  Patient is planning to go straight to outpatient therapy at [facility] on Friday 08/01/2016  [name], PA-C

## 2016-07-28 ENCOUNTER — Encounter (HOSPITAL_COMMUNITY): Payer: Self-pay

## 2016-07-28 ENCOUNTER — Inpatient Hospital Stay (HOSPITAL_COMMUNITY): Payer: Medicare PPO | Admitting: Anesthesiology

## 2016-07-28 ENCOUNTER — Inpatient Hospital Stay (HOSPITAL_COMMUNITY)
Admission: RE | Admit: 2016-07-28 | Discharge: 2016-07-30 | DRG: 470 | Disposition: A | Discharge status: Home / Self Care | Payer: Medicare PPO | Source: Ambulatory Visit | Attending: Orthopedic Surgery | Admitting: Orthopedic Surgery

## 2016-07-28 ENCOUNTER — Encounter (HOSPITAL_COMMUNITY)
Admission: RE | Disposition: A | Discharge status: Home / Self Care | Payer: Self-pay | Source: Ambulatory Visit | Attending: Orthopedic Surgery

## 2016-07-28 DIAGNOSIS — R7303 Prediabetes: Secondary | ICD-10-CM | POA: Diagnosis present

## 2016-07-28 DIAGNOSIS — Z880 Allergy status to penicillin: Secondary | ICD-10-CM | POA: Diagnosis not present

## 2016-07-28 DIAGNOSIS — G4730 Sleep apnea, unspecified: Secondary | ICD-10-CM | POA: Diagnosis present

## 2016-07-28 DIAGNOSIS — E7800 Pure hypercholesterolemia, unspecified: Secondary | ICD-10-CM | POA: Diagnosis present

## 2016-07-28 DIAGNOSIS — Z96651 Presence of right artificial knee joint: Secondary | ICD-10-CM | POA: Diagnosis present

## 2016-07-28 DIAGNOSIS — I129 Hypertensive chronic kidney disease with stage 1 through stage 4 chronic kidney disease, or unspecified chronic kidney disease: Secondary | ICD-10-CM | POA: Diagnosis present

## 2016-07-28 DIAGNOSIS — Z87891 Personal history of nicotine dependence: Secondary | ICD-10-CM | POA: Diagnosis not present

## 2016-07-28 DIAGNOSIS — N189 Chronic kidney disease, unspecified: Secondary | ICD-10-CM | POA: Diagnosis present

## 2016-07-28 DIAGNOSIS — Z79899 Other long term (current) drug therapy: Secondary | ICD-10-CM

## 2016-07-28 DIAGNOSIS — F329 Major depressive disorder, single episode, unspecified: Secondary | ICD-10-CM | POA: Diagnosis present

## 2016-07-28 DIAGNOSIS — M1710 Unilateral primary osteoarthritis, unspecified knee: Secondary | ICD-10-CM | POA: Diagnosis present

## 2016-07-28 DIAGNOSIS — M179 Osteoarthritis of knee, unspecified: Secondary | ICD-10-CM | POA: Diagnosis present

## 2016-07-28 DIAGNOSIS — F419 Anxiety disorder, unspecified: Secondary | ICD-10-CM | POA: Diagnosis present

## 2016-07-28 DIAGNOSIS — Z85828 Personal history of other malignant neoplasm of skin: Secondary | ICD-10-CM

## 2016-07-28 DIAGNOSIS — I4891 Unspecified atrial fibrillation: Secondary | ICD-10-CM | POA: Diagnosis present

## 2016-07-28 DIAGNOSIS — M25562 Pain in left knee: Secondary | ICD-10-CM | POA: Diagnosis present

## 2016-07-28 DIAGNOSIS — M1712 Unilateral primary osteoarthritis, left knee: Secondary | ICD-10-CM | POA: Diagnosis present

## 2016-07-28 DIAGNOSIS — Z7901 Long term (current) use of anticoagulants: Secondary | ICD-10-CM | POA: Diagnosis not present

## 2016-07-28 HISTORY — PX: TOTAL KNEE ARTHROPLASTY: SHX125

## 2016-07-28 LAB — GLUCOSE, CAPILLARY
GLUCOSE-CAPILLARY: 110 mg/dL — AB (ref 65–99)
Glucose-Capillary: 137 mg/dL — ABNORMAL HIGH (ref 65–99)

## 2016-07-28 LAB — TYPE AND SCREEN
ABO/RH(D): A NEG
ANTIBODY SCREEN: NEGATIVE

## 2016-07-28 LAB — PROTIME-INR
INR: 1.02
PROTHROMBIN TIME: 13.4 s (ref 11.4–15.2)

## 2016-07-28 SURGERY — ARTHROPLASTY, KNEE, TOTAL
Anesthesia: General | Site: Knee | Laterality: Left

## 2016-07-28 MED ORDER — SUGAMMADEX SODIUM 200 MG/2ML IV SOLN
INTRAVENOUS | Status: DC | PRN
  Administered 2016-07-28: 200 mg via INTRAVENOUS

## 2016-07-28 MED ORDER — DEXAMETHASONE SODIUM PHOSPHATE 10 MG/ML IJ SOLN
INTRAMUSCULAR | Status: DC | PRN
  Administered 2016-07-28: 10 mg via INTRAVENOUS

## 2016-07-28 MED ORDER — LIDOCAINE 2% (20 MG/ML) 5 ML SYRINGE
INTRAMUSCULAR | Status: DC | PRN
  Administered 2016-07-28: 100 mg via INTRAVENOUS

## 2016-07-28 MED ORDER — BUPIVACAINE LIPOSOME 1.3 % IJ SUSP
20.0000 mL | Freq: Once | INTRAMUSCULAR | Status: DC
  Filled 2016-07-28: qty 20

## 2016-07-28 MED ORDER — BISACODYL 10 MG RE SUPP: 10.0000 mg | Freq: Every day | RECTAL | Status: DC | PRN

## 2016-07-28 MED ORDER — ACETAMINOPHEN 10 MG/ML IV SOLN
INTRAVENOUS | Status: AC
  Filled 2016-07-28: qty 100

## 2016-07-28 MED ORDER — FLEET ENEMA 7-19 GM/118ML RE ENEM: 1.0000 | ENEMA | Freq: Once | RECTAL | Status: DC | PRN

## 2016-07-28 MED ORDER — SUGAMMADEX SODIUM 200 MG/2ML IV SOLN
INTRAVENOUS | Status: AC
  Filled 2016-07-28: qty 2

## 2016-07-28 MED ORDER — DEXAMETHASONE SODIUM PHOSPHATE 10 MG/ML IJ SOLN
10.0000 mg | Freq: Once | INTRAMUSCULAR | Status: AC
  Administered 2016-07-29: 10 mg via INTRAVENOUS
  Filled 2016-07-28: qty 1

## 2016-07-28 MED ORDER — METOCLOPRAMIDE HCL 5 MG PO TABS: 5.0000 mg | ORAL_TABLET | Freq: Three times a day (TID) | ORAL | Status: DC | PRN

## 2016-07-28 MED ORDER — ACETAMINOPHEN 10 MG/ML IV SOLN
1000.0000 mg | Freq: Once | INTRAVENOUS | Status: AC
  Administered 2016-07-28: 1000 mg via INTRAVENOUS

## 2016-07-28 MED ORDER — FENTANYL CITRATE (PF) 100 MCG/2ML IJ SOLN
INTRAMUSCULAR | Status: AC
  Filled 2016-07-28: qty 2

## 2016-07-28 MED ORDER — METHOCARBAMOL 1000 MG/10ML IJ SOLN
500.0000 mg | Freq: Four times a day (QID) | INTRAVENOUS | Status: DC | PRN
  Administered 2016-07-28: 500 mg via INTRAVENOUS
  Filled 2016-07-28: qty 550
  Filled 2016-07-28: qty 5

## 2016-07-28 MED ORDER — POLYETHYLENE GLYCOL 3350 17 G PO PACK: 17.0000 g | PACK | Freq: Every day | ORAL | Status: DC | PRN

## 2016-07-28 MED ORDER — LACTATED RINGERS IV SOLN
INTRAVENOUS | Status: DC
  Administered 2016-07-28: 06:00:00 via INTRAVENOUS

## 2016-07-28 MED ORDER — EPHEDRINE SULFATE-NACL 50-0.9 MG/10ML-% IV SOSY
PREFILLED_SYRINGE | INTRAVENOUS | Status: DC | PRN
  Administered 2016-07-28: 5 mg via INTRAVENOUS

## 2016-07-28 MED ORDER — ACETAMINOPHEN 325 MG PO TABS: 650.0000 mg | ORAL_TABLET | Freq: Four times a day (QID) | ORAL | Status: DC | PRN

## 2016-07-28 MED ORDER — TRIAMTERENE-HCTZ 75-50 MG PO TABS
1.0000 | ORAL_TABLET | Freq: Every day | ORAL | Status: DC
  Filled 2016-07-28 (×2): qty 1

## 2016-07-28 MED ORDER — SODIUM CHLORIDE 0.9 % IJ SOLN
INTRAMUSCULAR | Status: AC
  Filled 2016-07-28: qty 10

## 2016-07-28 MED ORDER — SUCCINYLCHOLINE CHLORIDE 200 MG/10ML IV SOSY
PREFILLED_SYRINGE | INTRAVENOUS | Status: AC
  Filled 2016-07-28: qty 10

## 2016-07-28 MED ORDER — VANCOMYCIN HCL IN DEXTROSE 1-5 GM/200ML-% IV SOLN
1000.0000 mg | Freq: Two times a day (BID) | INTRAVENOUS | Status: AC
  Administered 2016-07-28: 1000 mg via INTRAVENOUS
  Filled 2016-07-28: qty 200

## 2016-07-28 MED ORDER — PROPOFOL 10 MG/ML IV BOLUS
INTRAVENOUS | Status: AC
  Filled 2016-07-28: qty 40

## 2016-07-28 MED ORDER — SODIUM CHLORIDE 0.9 % IR SOLN
Status: DC | PRN
  Administered 2016-07-28: 1000 mL

## 2016-07-28 MED ORDER — CITALOPRAM HYDROBROMIDE 20 MG PO TABS
20.0000 mg | ORAL_TABLET | Freq: Every evening | ORAL | Status: DC
  Administered 2016-07-28 – 2016-07-29 (×2): 20 mg via ORAL
  Filled 2016-07-28 (×2): qty 1

## 2016-07-28 MED ORDER — CHLORHEXIDINE GLUCONATE 0.12 % MT SOLN
15.0000 mL | Freq: Two times a day (BID) | OROMUCOSAL | Status: DC
  Administered 2016-07-28 – 2016-07-30 (×3): 15 mL via OROMUCOSAL
  Filled 2016-07-28 (×3): qty 15

## 2016-07-28 MED ORDER — DEXAMETHASONE SODIUM PHOSPHATE 10 MG/ML IJ SOLN: 10.0000 mg | Freq: Once | INTRAMUSCULAR | Status: DC

## 2016-07-28 MED ORDER — PHENOL 1.4 % MT LIQD
1.0000 | OROMUCOSAL | Status: DC | PRN
Start: 2016-07-28 — End: 2016-07-30
  Filled 2016-07-28: qty 177

## 2016-07-28 MED ORDER — MORPHINE SULFATE (PF) 2 MG/ML IV SOLN
1.0000 mg | INTRAVENOUS | Status: DC | PRN
  Administered 2016-07-28 – 2016-07-29 (×4): 1 mg via INTRAVENOUS
  Filled 2016-07-28 (×5): qty 1

## 2016-07-28 MED ORDER — METOPROLOL TARTRATE 25 MG PO TABS
25.0000 mg | ORAL_TABLET | Freq: Two times a day (BID) | ORAL | Status: DC
  Administered 2016-07-28 – 2016-07-29 (×2): 25 mg via ORAL
  Filled 2016-07-28 (×4): qty 1

## 2016-07-28 MED ORDER — METHOCARBAMOL 500 MG PO TABS
500.0000 mg | ORAL_TABLET | Freq: Four times a day (QID) | ORAL | Status: DC | PRN
  Administered 2016-07-28 – 2016-07-29 (×2): 500 mg via ORAL
  Filled 2016-07-28 (×2): qty 1

## 2016-07-28 MED ORDER — APIXABAN 2.5 MG PO TABS
2.5000 mg | ORAL_TABLET | Freq: Two times a day (BID) | ORAL | Status: DC
  Administered 2016-07-29 – 2016-07-30 (×3): 2.5 mg via ORAL
  Filled 2016-07-28 (×3): qty 1

## 2016-07-28 MED ORDER — FENTANYL CITRATE (PF) 100 MCG/2ML IJ SOLN
25.0000 ug | INTRAMUSCULAR | Status: DC | PRN
  Administered 2016-07-28 (×2): 25 ug via INTRAVENOUS

## 2016-07-28 MED ORDER — DIPHENHYDRAMINE HCL 12.5 MG/5ML PO ELIX
12.5000 mg | ORAL_SOLUTION | ORAL | Status: DC | PRN
  Administered 2016-07-29: 12.5 mg via ORAL
  Administered 2016-07-29: 25 mg via ORAL
  Filled 2016-07-28 (×2): qty 10

## 2016-07-28 MED ORDER — BUPIVACAINE HCL (PF) 0.25 % IJ SOLN
INTRAMUSCULAR | Status: AC
  Filled 2016-07-28: qty 30

## 2016-07-28 MED ORDER — STERILE WATER FOR IRRIGATION IR SOLN
Status: DC | PRN
  Administered 2016-07-28: 2000 mL

## 2016-07-28 MED ORDER — ORAL CARE MOUTH RINSE
15.0000 mL | Freq: Two times a day (BID) | OROMUCOSAL | Status: DC
  Administered 2016-07-28 – 2016-07-29 (×3): 15 mL via OROMUCOSAL

## 2016-07-28 MED ORDER — METOCLOPRAMIDE HCL 5 MG/ML IJ SOLN: 5.0000 mg | Freq: Three times a day (TID) | INTRAMUSCULAR | Status: DC | PRN

## 2016-07-28 MED ORDER — EPHEDRINE 5 MG/ML INJ
INTRAVENOUS | Status: AC
  Filled 2016-07-28: qty 10

## 2016-07-28 MED ORDER — CHLORHEXIDINE GLUCONATE 4 % EX LIQD: 60.0000 mL | Freq: Once | CUTANEOUS | Status: DC

## 2016-07-28 MED ORDER — 0.9 % SODIUM CHLORIDE (POUR BTL) OPTIME
TOPICAL | Status: DC | PRN
  Administered 2016-07-28: 1000 mL

## 2016-07-28 MED ORDER — VANCOMYCIN HCL IN DEXTROSE 1-5 GM/200ML-% IV SOLN
1000.0000 mg | INTRAVENOUS | Status: AC
  Administered 2016-07-28: 1000 mg via INTRAVENOUS
  Filled 2016-07-28: qty 200

## 2016-07-28 MED ORDER — SODIUM CHLORIDE 0.9 % IJ SOLN
INTRAMUSCULAR | Status: DC | PRN
  Administered 2016-07-28: 30 mL via INTRAVENOUS

## 2016-07-28 MED ORDER — ROCURONIUM BROMIDE 50 MG/5ML IV SOSY
PREFILLED_SYRINGE | INTRAVENOUS | Status: DC | PRN
  Administered 2016-07-28: 20 mg via INTRAVENOUS

## 2016-07-28 MED ORDER — MENTHOL 3 MG MT LOZG: 1.0000 | LOZENGE | OROMUCOSAL | Status: DC | PRN

## 2016-07-28 MED ORDER — ONDANSETRON HCL 4 MG/2ML IJ SOLN
INTRAMUSCULAR | Status: AC
Start: 2016-07-28 — End: 2016-07-28
  Filled 2016-07-28: qty 2

## 2016-07-28 MED ORDER — FENTANYL CITRATE (PF) 100 MCG/2ML IJ SOLN
INTRAMUSCULAR | Status: DC | PRN
  Administered 2016-07-28 (×6): 50 ug via INTRAVENOUS

## 2016-07-28 MED ORDER — MIDAZOLAM HCL 2 MG/2ML IJ SOLN
INTRAMUSCULAR | Status: AC
  Filled 2016-07-28: qty 2

## 2016-07-28 MED ORDER — PRAVASTATIN SODIUM 20 MG PO TABS
40.0000 mg | ORAL_TABLET | Freq: Every day | ORAL | Status: DC
  Filled 2016-07-28: qty 2

## 2016-07-28 MED ORDER — DEXAMETHASONE SODIUM PHOSPHATE 10 MG/ML IJ SOLN
INTRAMUSCULAR | Status: AC
  Filled 2016-07-28: qty 1

## 2016-07-28 MED ORDER — PROPOFOL 10 MG/ML IV BOLUS
INTRAVENOUS | Status: DC | PRN
  Administered 2016-07-28: 150 mg via INTRAVENOUS

## 2016-07-28 MED ORDER — ONDANSETRON HCL 4 MG/2ML IJ SOLN: 4.0000 mg | Freq: Once | INTRAMUSCULAR | Status: DC | PRN

## 2016-07-28 MED ORDER — AMLODIPINE BESYLATE 5 MG PO TABS
2.5000 mg | ORAL_TABLET | Freq: Every day | ORAL | Status: DC
  Filled 2016-07-28 (×2): qty 1

## 2016-07-28 MED ORDER — OXYCODONE HCL 5 MG PO TABS
5.0000 mg | ORAL_TABLET | ORAL | Status: DC | PRN
  Administered 2016-07-28: 5 mg via ORAL
  Administered 2016-07-28: 10 mg via ORAL
  Administered 2016-07-28 (×3): 5 mg via ORAL
  Administered 2016-07-28 – 2016-07-29 (×3): 10 mg via ORAL
  Administered 2016-07-29: 5 mg via ORAL
  Administered 2016-07-29 (×2): 10 mg via ORAL
  Administered 2016-07-29: 5 mg via ORAL
  Administered 2016-07-30 (×2): 10 mg via ORAL
  Filled 2016-07-28 (×2): qty 2
  Filled 2016-07-28: qty 1
  Filled 2016-07-28: qty 2
  Filled 2016-07-28: qty 1
  Filled 2016-07-28 (×2): qty 2
  Filled 2016-07-28: qty 1
  Filled 2016-07-28: qty 2
  Filled 2016-07-28: qty 1
  Filled 2016-07-28 (×3): qty 2
  Filled 2016-07-28 (×2): qty 1

## 2016-07-28 MED ORDER — ONDANSETRON HCL 4 MG/2ML IJ SOLN
INTRAMUSCULAR | Status: DC | PRN
  Administered 2016-07-28: 4 mg via INTRAVENOUS

## 2016-07-28 MED ORDER — BUPIVACAINE HCL 0.25 % IJ SOLN
INTRAMUSCULAR | Status: DC | PRN
  Administered 2016-07-28: 20 mL

## 2016-07-28 MED ORDER — DOCUSATE SODIUM 100 MG PO CAPS
100.0000 mg | ORAL_CAPSULE | Freq: Two times a day (BID) | ORAL | Status: DC
  Administered 2016-07-28 – 2016-07-30 (×5): 100 mg via ORAL
  Filled 2016-07-28 (×5): qty 1

## 2016-07-28 MED ORDER — ONDANSETRON HCL 4 MG/2ML IJ SOLN: 4.0000 mg | Freq: Four times a day (QID) | INTRAMUSCULAR | Status: DC | PRN

## 2016-07-28 MED ORDER — SODIUM CHLORIDE 0.9 % IV SOLN
INTRAVENOUS | Status: DC
  Administered 2016-07-28: 19:00:00 via INTRAVENOUS

## 2016-07-28 MED ORDER — BUPIVACAINE LIPOSOME 1.3 % IJ SUSP
INTRAMUSCULAR | Status: DC | PRN
  Administered 2016-07-28: 20 mL

## 2016-07-28 MED ORDER — ACETAMINOPHEN 650 MG RE SUPP: 650.0000 mg | Freq: Four times a day (QID) | RECTAL | Status: DC | PRN

## 2016-07-28 MED ORDER — SODIUM CHLORIDE 0.9 % IJ SOLN
INTRAMUSCULAR | Status: AC
  Filled 2016-07-28: qty 50

## 2016-07-28 MED ORDER — ACETAMINOPHEN 500 MG PO TABS
1000.0000 mg | ORAL_TABLET | Freq: Four times a day (QID) | ORAL | Status: AC
Start: 2016-07-28 — End: 2016-07-29
  Administered 2016-07-28 – 2016-07-29 (×3): 1000 mg via ORAL
  Filled 2016-07-28 (×4): qty 2

## 2016-07-28 MED ORDER — ONDANSETRON HCL 4 MG PO TABS: 4.0000 mg | ORAL_TABLET | Freq: Four times a day (QID) | ORAL | Status: DC | PRN

## 2016-07-28 MED ORDER — TRANEXAMIC ACID 1000 MG/10ML IV SOLN
1000.0000 mg | INTRAVENOUS | Status: AC
  Administered 2016-07-28: 1000 mg via INTRAVENOUS
  Filled 2016-07-28: qty 1100

## 2016-07-28 MED ORDER — ROCURONIUM BROMIDE 50 MG/5ML IV SOSY
PREFILLED_SYRINGE | INTRAVENOUS | Status: AC
  Filled 2016-07-28: qty 5

## 2016-07-28 MED ORDER — SUCCINYLCHOLINE CHLORIDE 200 MG/10ML IV SOSY
PREFILLED_SYRINGE | INTRAVENOUS | Status: DC | PRN
  Administered 2016-07-28: 100 mg via INTRAVENOUS

## 2016-07-28 MED ORDER — MIDAZOLAM HCL 5 MG/5ML IJ SOLN
INTRAMUSCULAR | Status: DC | PRN
  Administered 2016-07-28 (×2): 1 mg via INTRAVENOUS

## 2016-07-28 SURGICAL SUPPLY — 51 items
BAG SPEC THK2 15X12 ZIP CLS (MISCELLANEOUS) ×1
BAG ZIPLOCK 12X15 (MISCELLANEOUS) ×3 IMPLANT
BANDAGE ACE 6X5 VEL STRL LF (GAUZE/BANDAGES/DRESSINGS) ×3 IMPLANT
BLADE SAG 18X100X1.27 (BLADE) ×3 IMPLANT
BLADE SAW SGTL 11.0X1.19X90.0M (BLADE) ×3 IMPLANT
BOWL SMART MIX CTS (DISPOSABLE) ×3 IMPLANT
CAPT KNEE TOTAL 3 ATTUNE ×2 IMPLANT
CEMENT HV SMART SET (Cement) ×6 IMPLANT
CLOSURE WOUND 1/2 X4 (GAUZE/BANDAGES/DRESSINGS) ×2
CLOTH BEACON ORANGE TIMEOUT ST (SAFETY) ×3 IMPLANT
CUFF TOURN SGL QUICK 34 (TOURNIQUET CUFF) ×3
CUFF TRNQT CYL 34X4X40X1 (TOURNIQUET CUFF) ×1 IMPLANT
DECANTER SPIKE VIAL GLASS SM (MISCELLANEOUS) ×3 IMPLANT
DRAPE U-SHAPE 47X51 STRL (DRAPES) ×3 IMPLANT
DRSG ADAPTIC 3X8 NADH LF (GAUZE/BANDAGES/DRESSINGS) ×3 IMPLANT
DRSG PAD ABDOMINAL 8X10 ST (GAUZE/BANDAGES/DRESSINGS) ×3 IMPLANT
DURAPREP 26ML APPLICATOR (WOUND CARE) ×3 IMPLANT
ELECT REM PT RETURN 9FT ADLT (ELECTROSURGICAL) ×3
ELECTRODE REM PT RTRN 9FT ADLT (ELECTROSURGICAL) ×1 IMPLANT
EVACUATOR 1/8 PVC DRAIN (DRAIN) ×3 IMPLANT
GAUZE SPONGE 4X4 12PLY STRL (GAUZE/BANDAGES/DRESSINGS) ×3 IMPLANT
GLOVE BIO SURGEON STRL SZ8 (GLOVE) ×3 IMPLANT
GLOVE BIOGEL PI IND STRL 6.5 (GLOVE) IMPLANT
GLOVE BIOGEL PI IND STRL 7.5 (GLOVE) IMPLANT
GLOVE BIOGEL PI IND STRL 8 (GLOVE) ×1 IMPLANT
GLOVE BIOGEL PI INDICATOR 6.5 (GLOVE) ×2
GLOVE BIOGEL PI INDICATOR 7.5 (GLOVE) ×8
GLOVE BIOGEL PI INDICATOR 8 (GLOVE) ×2
GLOVE SURG SS PI 6.5 STRL IVOR (GLOVE) ×2 IMPLANT
GLOVE SURG SS PI 7.5 STRL IVOR (GLOVE) ×4 IMPLANT
GOWN SPEC L3 XXLG W/TWL (GOWN DISPOSABLE) ×2 IMPLANT
GOWN STRL REUS W/ TWL XL LVL3 (GOWN DISPOSABLE) IMPLANT
GOWN STRL REUS W/TWL LRG LVL3 (GOWN DISPOSABLE) ×5 IMPLANT
GOWN STRL REUS W/TWL XL LVL3 (GOWN DISPOSABLE) ×3
HANDPIECE INTERPULSE COAX TIP (DISPOSABLE) ×3
IMMOBILIZER KNEE 20 (SOFTGOODS) ×3
IMMOBILIZER KNEE 20 THIGH 36 (SOFTGOODS) ×1 IMPLANT
MANIFOLD NEPTUNE II (INSTRUMENTS) ×3 IMPLANT
PACK TOTAL KNEE CUSTOM (KITS) ×3 IMPLANT
PADDING CAST COTTON 6X4 STRL (CAST SUPPLIES) ×9 IMPLANT
POSITIONER SURGICAL ARM (MISCELLANEOUS) ×3 IMPLANT
SET HNDPC FAN SPRY TIP SCT (DISPOSABLE) ×1 IMPLANT
STRIP CLOSURE SKIN 1/2X4 (GAUZE/BANDAGES/DRESSINGS) ×4 IMPLANT
SUT MNCRL AB 4-0 PS2 18 (SUTURE) ×3 IMPLANT
SUT VIC AB 2-0 CT1 27 (SUTURE) ×9
SUT VIC AB 2-0 CT1 TAPERPNT 27 (SUTURE) ×3 IMPLANT
SUT VLOC 180 0 24IN GS25 (SUTURE) ×3 IMPLANT
SYR 50ML LL SCALE MARK (SYRINGE) ×3 IMPLANT
TRAY FOLEY CATH SILVER 14FR (SET/KITS/TRAYS/PACK) ×2 IMPLANT
WRAP KNEE MAXI GEL POST OP (GAUZE/BANDAGES/DRESSINGS) ×3 IMPLANT
YANKAUER SUCT BULB TIP 10FT TU (MISCELLANEOUS) ×3 IMPLANT

## 2016-07-28 NOTE — Progress Notes (Signed)
Patient has CPAP machine with [facility], states she can place herself on and off. Pt made aware that if she needs assistance to call.

## 2016-07-28 NOTE — Evaluation (Signed)
Physical Therapy Evaluation Patient Details Name: Shannon Aguilar MRN: [id number] DOB: May 09, 1946 Today's Date: 07/28/2016   History of Present Illness  Pt is a 70 year old female s/p L TKA with hx of R TKA 11/19/15  Clinical Impression  Pt is s/p TKA resulting in the deficits listed below (see PT Problem List).  Pt will benefit from skilled PT to increase their independence and safety with mobility to allow discharge to the venue listed below.  Pt tolerated short distance ambulation well POD #0 and plans to d/c home and start outpatient PT Friday.     Follow Up Recommendations Outpatient PT    Equipment Recommendations  None recommended by PT    Recommendations for Other Services       Precautions / Restrictions Precautions Precautions: Fall;Knee Precaution Comments: able to perform SLR Restrictions Other Position/Activity Restrictions: WBAT      Mobility  Bed Mobility Overal bed mobility: Needs Assistance Bed Mobility: Supine to Sit     Supine to sit: Min assist;HOB elevated     General bed mobility comments: assist for L LE  Transfers Overall transfer level: Needs assistance Equipment used: Rolling walker (2 wheeled) Transfers: Sit to/from Stand Sit to Stand: Min assist         General transfer comment: verbal cues for safe technique, assist to rise and steady  Ambulation/Gait Ambulation/Gait assistance: Min guard Ambulation Distance (Feet): 50 Feet Assistive device: Rolling walker (2 wheeled) Gait Pattern/deviations: Step-to pattern;Decreased stance time - left;Antalgic     General Gait Details: verbal cues for sequence, RW positioning, posture  Stairs            Wheelchair Mobility    Modified Rankin (Stroke Patients Only)       Balance                                             Pertinent Vitals/Pain Pain Assessment: 0-10 Pain Score: 7  Pain Location: L knee Pain Descriptors / Indicators: Aching;Sore Pain  Intervention(s): Limited activity within patient's tolerance;Monitored during session;Premedicated before session;Patient requesting pain meds-RN notified;Repositioned;Ice applied    Home Living Family/patient expects to be discharged to:: Private residence Living Arrangements: Spouse/significant other;Children Available Help at Discharge: Family Type of Home: House Home Access: Stairs to enter Entrance Stairs-Rails: Right;Left;Can reach both Entrance Stairs-Number of Steps: 4 Home Layout: Able to live on main level with bedroom/bathroom;Two level Home Equipment: Walker - 2 wheels;Bedside commode      Prior Function Level of Independence: Independent               Hand Dominance        Extremity/Trunk Assessment               Lower Extremity Assessment: LLE deficits/detail   LLE Deficits / Details: able to perform SLR, ROM TBA     Communication   Communication: No difficulties  Cognition Arousal/Alertness: Awake/alert Behavior During Therapy: WFL for tasks assessed/performed Overall Cognitive Status: Within Functional Limits for tasks assessed                      General Comments      Exercises     Assessment/Plan    PT Assessment Patient needs continued PT services  PT Problem List Decreased strength;Decreased range of motion;Decreased mobility;Pain          PT  Treatment Interventions Functional mobility training;Stair training;Gait training;DME instruction;Therapeutic activities;Therapeutic exercise;Patient/family education    PT Goals (Current goals can be found in the Care Plan section)  Acute Rehab PT Goals PT Goal Formulation: With patient Time For Goal Achievement: 08/01/16 Potential to Achieve Goals: Good    Frequency 7X/week   Barriers to discharge        Co-evaluation               End of Session Equipment Utilized During Treatment: Gait belt Activity Tolerance: Patient tolerated treatment well Patient left: in  chair;with call bell/phone within reach;with chair alarm set Nurse Communication: Mobility status;Patient requests pain meds         Time: [id number] PT Time Calculation (min) (ACUTE ONLY): 13 min   Charges:   PT Evaluation $PT Eval Low Complexity: 1 Procedure     PT G Codes:        Pearly Bartosik,[name] 07/28/2016, 3:31 PM [name], PT, DPT 07/28/2016 Pager: [phone]

## 2016-07-28 NOTE — Interval H&P Note (Signed)
History and Physical Interval Note:  07/28/2016 6:40 AM  Shannon Aguilar  has presented today for surgery, with the diagnosis of LEFT KNEE OA  The various methods of treatment have been discussed with the patient and family. After consideration of risks, benefits and other options for treatment, the patient has consented to  Procedure(s): LEFT TOTAL KNEE ARTHROPLASTY (Left) as a surgical intervention .  The patient's history has been reviewed, patient examined, no change in status, stable for surgery.  I have reviewed the patient's chart and labs.  Questions were answered to the patient's satisfaction.     Gearlean Alf

## 2016-07-28 NOTE — Anesthesia Procedure Notes (Signed)
Procedure Name: Intubation Date/Time: 07/28/2016 7:13 AM Performed by: West Pugh Pre-anesthesia Checklist: Patient identified, Emergency Drugs available, Suction available, Patient being monitored and Timeout performed Patient Re-evaluated:Patient Re-evaluated prior to inductionOxygen Delivery Method: Circle system utilized Preoxygenation: Pre-oxygenation with 100% oxygen Intubation Type: IV induction Ventilation: Mask ventilation without difficulty Laryngoscope Size: Mac Grade View: Grade II Tube type: Oral Tube size: 7.5 mm Number of attempts: 1 Airway Equipment and Method: Stylet Placement Confirmation: ETT inserted through vocal cords under direct vision,  positive ETCO2,  CO2 detector and breath sounds checked- equal and bilateral Secured at: 22 cm Tube secured with: Tape Dental Injury: Teeth and Oropharynx as per pre-operative assessment

## 2016-07-28 NOTE — Transfer of Care (Signed)
Immediate Anesthesia Transfer of Care Note  Patient: Gean Birchwood  Procedure(s) Performed: Procedure(s): LEFT TOTAL KNEE ARTHROPLASTY (Left)  Patient Location: PACU  Anesthesia Type:General  Level of Consciousness:  sedated, patient cooperative and responds to stimulation  Airway & Oxygen Therapy:Patient Spontanous Breathing and Patient connected to face mask oxgen  Post-op Assessment:  Report given to PACU RN and Post -op Vital signs reviewed and stable  Post vital signs:  Reviewed and stable  Last Vitals:  Vitals:   07/28/16 0549  BP: 131/85  Pulse: 77  Resp: 16  Temp: [id number] C    Complications: No apparent anesthesia complications

## 2016-07-28 NOTE — H&P (View-Only) (Signed)
Shannon Aguilar DOB: [birth year] Married / Language: English / Race: White Female Date of Admission:  07/28/2016 CC:  Left Knee Pain History of Present Illness  The patient is a 70 year old female who comes in for a preoperative History and Physical. The patient is scheduled for a left total knee arthroplasty to be performed by Dr. Dione Plover. Aluisio, MD at [facility] on 07-28-2016. The patient is a 70 year old female presenting for a post-operative visit for the right knee. The patient comes in months out from right total knee arthroplasty. The patient states that she is doing very well at this time. The pain is under fair control at this time and describe their pain as mild.  The patient feels that they are progressing well at this time.  Unfortunately, the left knee continues to problematic and she has reached a point where she would like to get the other side replaced. She is doing great in regards to the right knee. She is very pleased with her progress with that. The left knee is what is giving her the most trouble. She has real significant pain in that left knee. It is not as intense as the right knee was prior to surgery, but she does not want to let it get that bad. She said it is bothering her enough and she is having enough functional problems where she would be in favor of pursuing knee replacement now. She has had cortisone and viscosupplement injections without tremendous benefit.  She is ready to proceed with surgery. Risks and benefits of the surgery have been discussed with the patient and they elect to proceed with surgery.  There are on active contraindications to upcoming procedure such as ongoing infection or progressive neurological disease.  Problem List/Past Medical S/P arthroscopy of knee (V45.89)  Primary osteoarthritis of right hip (M16.11)  Primary osteoarthritis of right knee (M17.11)  Achilles tendon sprain [id number])  Status post total right knee replacement  (Z96.651)  Right knee pain (M25.561)  Hip osteoarthritis (M16.9)  Cardiac Arrhythmia  High blood pressure  Sleep Apnea  uses CPAP Chronic Cystitis  Atrial Fibrillation  Depression  Osteoarthritis  Kidney Stone  Hypercholesterolemia  Tinnitus  Cataract  Bronchitis  Hemorrhoids  Urinary Tract Infection  Measles  Mumps  Rubella  Menopause  Urinary tract infection in female (N39.0)   Allergies  Penicillin G Sodium *PENICILLINS*  Rash.  Family History Osteoarthritis  Mother. mother Hypertension  First Degree Relatives. mother and brother Severe allergy  brother Rheumatoid Arthritis  mother Depression  father and brother Congestive Heart Failure  father Heart Disease  mother, father and grandfather fathers side Diabetes Mellitus  mother, father and brother  Social History Alcohol use  current drinker; drinks wine; only occasionally per week Children  1 Current work status  working part time [facility] (Currently)  no Drug/Alcohol Rehab (Previously)  no Exercise  Exercises daily; does other and gym / weights Illicit drug use  no Living situation  live with spouse Marital status  married Number of flights of stairs before winded  2-3 Pain Contract  no Tobacco / smoke exposure  no Tobacco use  Former smoker. former smoker Post-Surgical Plans  Home  Medication History Livalo (2MG  Tablet, Oral) Active. (qd) Tylenol (325MG  Tablet, 1 (one) Oral) Active. (PRN) Triamterene-HCTZ (37.5-25MG  Capsule, Oral) Active. (qd) Citalopram Hydrobromide (40MG  Tablet, Oral) Active. (qd) Metoprolol Tartrate (25MG  Tablet, Oral) Active. Eliquis (5MG  Tablet, Oral) Active. AmLODIPine Besylate (2.5MG  Tablet, Oral) Active.  Past Surgical History  Cesarean Delivery  2 times; 1978, 1981 Total Knee Replacement - Right [11/19/2015]: Tonsillectomy  1957   Review of Systems General Not Present- Chills, Fatigue, Fever, Memory  Loss, Night Sweats, Weight Gain and Weight Loss. Skin Not Present- Eczema, Hives, Itching, Lesions and Rash. HEENT Not Present- Dentures, Double Vision, Headache, Hearing Loss, Tinnitus and Visual Loss. Respiratory Present- Cough. Not Present- Allergies, Chronic Cough, Coughing up blood, Shortness of breath at rest and Shortness of breath with exertion. Cardiovascular Present- Palpitations. Not Present- Chest Pain, Difficulty Breathing Lying Down, Murmur, Racing/skipping heartbeats and Swelling. Gastrointestinal Not Present- Abdominal Pain, Bloody Stool, Constipation, Diarrhea, Difficulty Swallowing, Heartburn, Jaundice, Loss of appetitie, Nausea and Vomiting. Female Genitourinary Not Present- Blood in Urine, Discharge, Flank Pain, Incontinence, Painful Urination, Urgency, Urinary frequency, Urinary Retention, Urinating at Night and Weak urinary stream. Musculoskeletal Present- Joint Pain, Joint Swelling and Morning Stiffness. Not Present- Back Pain, Muscle Pain, Muscle Weakness and Spasms. Neurological Not Present- Blackout spells, Difficulty with balance, Dizziness, Paralysis, Tremor and Weakness. Psychiatric Not Present- Insomnia.  Vitals Weight: 220 lb Height: 65in Weight was reported by patient. Height was reported by patient. Body Surface Area: 2.06 m Body Mass Index: 36.61 kg/m  Pulse: 56 (Regular)  BP: 126/60 (Sitting, Right Arm, Standard) Rate controlled with beta-blocker   Physical Exam General Mental Status -Alert, cooperative and good historian. General Appearance-pleasant, Not in acute distress. Orientation-Oriented X3. Build & Nutrition-Well nourished and Well developed.  Head and Neck Head-normocephalic, atraumatic . Neck Global Assessment - supple, no bruit auscultated on the right, no bruit auscultated on the left.  Eye Pupil - Bilateral-Regular and Round. Motion - Bilateral-EOMI.  Chest and Lung Exam Auscultation Breath sounds - clear  at anterior chest wall and clear at posterior chest wall. Adventitious sounds - No Adventitious sounds.  Cardiovascular Auscultation Rhythm - Bradycardic(currently on beta-blocker) and Regular rate and rhythm. Heart Sounds - S1 WNL and S2 WNL. Murmurs & Other Heart Sounds - Auscultation of the heart reveals - No Murmurs.  Abdomen Palpation/Percussion Tenderness - Abdomen is non-tender to palpation. Rigidity (guarding) - Abdomen is soft. Auscultation Auscultation of the abdomen reveals - Bowel sounds normal.  Female Genitourinary Note: Not done, not pertinent to present illness   Musculoskeletal Note: Well-developed female, in no distress. Her right knee looks great. There is no swelling in her right knee. Range of motion in the right knee is about 0 to 122 degrees. There is no tenderness or instability. Left knee varus deformity. Range about 5 to 120. Moderate crepitus on range of motion with tenderness medial greater than lateral with no instability.  RADIOGRAPHS AP and lateral of both knee showed the prosthesis on the right in excellent position with no periprosthetic abnormalities. On the left, she has got bone on bone arthritis in the medial and patellofemoral compartments with slight varus.   Assessment & Plan Status post right knee joint replacement surgery (Z47.1, Z96.651) Primary osteoarthritis of left knee (M17.12)  Note:Surgical Plans: Left Total Knee Replacement  Disposition: Home  PCP: Dr. Antony Contras  IV TXA  Anesthesia Issues: None  Patient is planning to go straight to outpatient therapy at [facility] on Friday 08/01/2016  [name], PA-C

## 2016-07-28 NOTE — Anesthesia Preprocedure Evaluation (Addendum)
Anesthesia Evaluation  Patient identified by MRN, date of birth, ID band Patient awake    Reviewed: Allergy & Precautions, NPO status , Patient's Chart, lab work & pertinent test results, reviewed documented beta blocker date and time   Airway Mallampati: II  TM Distance: <3 FB Neck ROM: Full    Dental  (+) Teeth Intact, Dental Advisory Given   Pulmonary sleep apnea and Continuous Positive Airway Pressure Ventilation , former smoker,    Pulmonary exam normal breath sounds clear to auscultation       Cardiovascular hypertension, Pt. on medications and Pt. on home beta blockers Normal cardiovascular exam+ dysrhythmias (PAFib) Atrial Fibrillation  Rhythm:Regular Rate:Normal     Neuro/Psych PSYCHIATRIC DISORDERS Anxiety Depression negative neurological ROS     GI/Hepatic negative GI ROS, Neg liver ROS,   Endo/Other  negative endocrine ROSObesity   Renal/GU Renal InsufficiencyRenal disease     Musculoskeletal  (+) Arthritis , Osteoarthritis,    Abdominal   Peds  Hematology  (+) Blood dyscrasia (Eliquis--last dose <72 hrs ago), , Plt 285k   Anesthesia Other Findings Day of surgery medications reviewed with the patient.  Reproductive/Obstetrics                            Anesthesia Physical Anesthesia Plan  ASA: III  Anesthesia Plan: General   Post-op Pain Management:    Induction: Intravenous  Airway Management Planned: Oral ETT  Additional Equipment:   Intra-op Plan:   Post-operative Plan: Extubation in OR  Informed Consent: I have reviewed the patients History and Physical, chart, labs and discussed the procedure including the risks, benefits and alternatives for the proposed anesthesia with the patient or authorized representative who has indicated his/her understanding and acceptance.   Dental advisory given  Plan Discussed with: CRNA, Anesthesiologist and Surgeon  Anesthesia  Plan Comments: (Risks/benefits of general anesthesia discussed with patient including risk of damage to teeth, lips, gum, and tongue, nausea/vomiting, allergic reactions to medications, and the possibility of heart attack, stroke and death.  All patient questions answered.  Patient wishes to proceed.  Last eliquis dose <72 hrs ago.  Per ASRA guidelines, neuraxial technique not recommended.)       Anesthesia Quick Evaluation

## 2016-07-28 NOTE — Anesthesia Postprocedure Evaluation (Signed)
Anesthesia Post Note  Patient: Shannon Aguilar  Procedure(s) Performed: Procedure(s) (LRB): LEFT TOTAL KNEE ARTHROPLASTY (Left)  Patient location during evaluation: PACU Anesthesia Type: General Level of consciousness: awake and alert Pain management: pain level controlled Vital Signs Assessment: post-procedure vital signs reviewed and stable Respiratory status: spontaneous breathing, nonlabored ventilation, respiratory function stable and patient connected to nasal cannula oxygen Cardiovascular status: blood pressure returned to baseline and stable Postop Assessment: no signs of nausea or vomiting Anesthetic complications: no    Last Vitals:  Vitals:   07/28/16 1230 07/28/16 1331  BP: 133/69 140/73  Pulse: 68 70  Resp: 16 16  Temp: 36.8 C 36.8 C    Last Pain:  Vitals:   07/28/16 1430  TempSrc:   PainSc: [name]

## 2016-07-28 NOTE — Op Note (Signed)
OPERATIVE REPORT-TOTAL KNEE ARTHROPLASTY   Pre-operative diagnosis- Osteoarthritis  Left knee(s)  Post-operative diagnosis- Osteoarthritis Left knee(s)  Procedure-  Left  Total Knee Arthroplasty (Depuy Attune)  Surgeon- Dione Plover. Kalmen Lollar, MD  Assistant- Ardeen Jourdain, PA-C   Anesthesia-  General  EBL-* No blood loss amount entered *   Drains Hemovac  Tourniquet time- 32 minutes @ XX123456 mm Hg  Complications- None  Condition-PACU - hemodynamically stable.   Brief Clinical Note  Shannon Aguilar is a 70 y.o. year old female with end stage OA of her left knee with progressively worsening pain and dysfunction. She has constant pain, with activity and at rest and significant functional deficits with difficulties even with ADLs. She has had extensive non-op management including analgesics, injections of cortisone and viscosupplements, and home exercise program, but remains in significant pain with significant dysfunction. Radiographs show bone on bone arthritis medial and patellofemoral. She presents now for left Total Knee Arthroplasty.    Procedure in detail---   The patient is brought into the operating room and positioned supine on the operating table. After successful administration of  General,   a tourniquet is placed high on the  Left thigh(s) and the lower extremity is prepped and draped in the usual sterile fashion. Time out is performed by the operating team and then the  Left lower extremity is wrapped in Esmarch, knee flexed and the tourniquet inflated to 300 mmHg.       A midline incision is made with a ten blade through the subcutaneous tissue to the level of the extensor mechanism. A fresh blade is used to make a medial parapatellar arthrotomy. Soft tissue over the proximal medial tibia is subperiosteally elevated to the joint line with a knife and into the semimembranosus bursa with a Cobb elevator. Soft tissue over the proximal lateral tibia is elevated with attention  being paid to avoiding the patellar tendon on the tibial tubercle. The patella is everted, knee flexed 90 degrees and the ACL and PCL are removed. Findings are bone on bone medial and patellofemoral with large globa osteophytes.        The drill is used to create a starting hole in the distal femur and the canal is thoroughly irrigated with sterile saline to remove the fatty contents. The 5 degree Left  valgus alignment guide is placed into the femoral canal and the distal femoral cutting block is pinned to remove 9 mm off the distal femur. Resection is made with an oscillating saw.      The tibia is subluxed forward and the menisci are removed. The extramedullary alignment guide is placed referencing proximally at the medial aspect of the tibial tubercle and distally along the second metatarsal axis and tibial crest. The block is pinned to remove 85mm off the more deficient medial  side. Resection is made with an oscillating saw. Size 5is the most appropriate size for the tibia and the proximal tibia is prepared with the modular drill and keel punch for that size.      The femoral sizing guide is placed and size 6 is most appropriate. Rotation is marked off the epicondylar axis and confirmed by creating a rectangular flexion gap at 90 degrees. The size 6 cutting block is pinned in this rotation and the anterior, posterior and chamfer cuts are made with the oscillating saw. The intercondylar block is then placed and that cut is made.      Trial size 5 tibial component, trial size 6 narrow posterior  stabilized femur and a 6  mm posterior stabilized rotating platform insert trial is placed. Full extension is achieved with excellent varus/valgus and anterior/posterior balance throughout full range of motion. The patella is everted and thickness measured to be 21  mm. Free hand resection is taken to 12 mm, a 35 template is placed, lug holes are drilled, trial patella is placed, and it tracks normally. Osteophytes are  removed off the posterior femur with the trial in place. All trials are removed and the cut bone surfaces prepared with pulsatile lavage. Cement is mixed and once ready for implantation, the size 5 tibial implant, size  6 narrow posterior stabilized femoral component, and the size 35 patella are cemented in place and the patella is held with the clamp. The trial insert is placed and the knee held in full extension. The Exparel (20 ml mixed with 30 ml saline) and .25% Bupivicaine, are injected into the extensor mechanism, posterior capsule, medial and lateral gutters and subcutaneous tissues.  All extruded cement is removed and once the cement is hard the permanent 6 mm posterior stabilized rotating platform insert is placed into the tibial tray.      The wound is copiously irrigated with saline solution and the extensor mechanism closed over a hemovac drain with #1 V-loc suture. The tourniquet is released for a total tourniquet time of 32  minutes. Flexion against gravity is 140 degrees and the patella tracks normally. Subcutaneous tissue is closed with 2.0 vicryl and subcuticular with running 4.0 Monocryl. The incision is cleaned and dried and steri-strips and a bulky sterile dressing are applied. The limb is placed into a knee immobilizer and the patient is awakened and transported to recovery in stable condition.      Please note that a surgical assistant was a medical necessity for this procedure in order to perform it in a safe and expeditious manner. Surgical assistant was necessary to retract the ligaments and vital neurovascular structures to prevent injury to them and also necessary for proper positioning of the limb to allow for anatomic placement of the prosthesis.   Dione Plover Cristal Howatt, MD    07/28/2016, 8:09 AM

## 2016-07-29 LAB — BASIC METABOLIC PANEL
ANION GAP: 8 (ref 5–15)
BUN: 19 mg/dL (ref 6–20)
CO2: 24 mmol/L (ref 22–32)
Calcium: 7.9 mg/dL — ABNORMAL LOW (ref 8.9–10.3)
Chloride: 103 mmol/L (ref 101–111)
Creatinine, Ser: 0.96 mg/dL (ref 0.44–1.00)
GFR calc Af Amer: >60 mL/min (ref >60)
GFR, EST NON AFRICAN AMERICAN: 59 mL/min — AB (ref >60)
GLUCOSE: 153 mg/dL — AB (ref 65–99)
POTASSIUM: 4.2 mmol/L (ref 3.5–5.1)
Sodium: 135 mmol/L (ref 135–145)

## 2016-07-29 LAB — CBC
HEMATOCRIT: 33 % — AB (ref 36.0–46.0)
Hemoglobin: 10.7 g/dL — ABNORMAL LOW (ref 12.0–15.0)
MCH: 29.9 pg (ref 26.0–34.0)
MCHC: 32.4 g/dL (ref 30.0–36.0)
MCV: 92.2 fL (ref 78.0–100.0)
PLATELETS: 242 10*3/uL (ref 150–400)
RBC: 3.58 MIL/uL — AB (ref 3.87–5.11)
RDW: 12.7 % (ref 11.5–15.5)
WBC: 14.7 10*3/uL — AB (ref 4.0–10.5)

## 2016-07-29 MED ORDER — METHOCARBAMOL 500 MG PO TABS: 500.0000 mg | ORAL_TABLET | Freq: Four times a day (QID) | ORAL | 0 refills | Status: DC | PRN

## 2016-07-29 MED ORDER — OXYCODONE HCL 5 MG PO TABS: 5.0000 mg | ORAL_TABLET | ORAL | 0 refills | Status: DC | PRN

## 2016-07-29 NOTE — Care Management Note (Signed)
Case Management Note  Patient Details  Name: Shannon Aguilar MRN: [id number] Date of Birth: 01-31-1946  Subjective/Objective:                  s/p L TKA  Action/Plan: Discharge planning Expected Discharge Date:  07/29/16               Expected Discharge Plan:  Home/Self Care  In-House Referral:  NA  Discharge planning Services  CM Consult  Post Acute Care Choice:  NA Choice offered to:     DME Arranged:  N/A DME Agency:  NA  HH Arranged:  NA HH Agency:  NA  Status of Service:  Completed, signed off  If discussed at [place] of Stay Meetings, dates discussed:    Additional Comments: CM met with pt in room to confirm plan is for outpt PT; pt confirms.  Pt states she has all DME needed at home.  No other CM needs were communicated. Dellie Catholic, RN 07/29/2016, 3:56 PM

## 2016-07-29 NOTE — Progress Notes (Signed)
OT Cancellation Note  Patient Details Name: ASTRA SPARACO MRN: [id number] DOB: Dec 23, 1945   Cancelled Treatment:    Reason Eval/Treat Not Completed: PT screened, no needs identified, will sign off.  Pt had other knee replaced in March.  Antawn Sison 07/29/2016, 10:55 AM  Lesle Chris, OTR/L [phone] 07/29/2016

## 2016-07-29 NOTE — Progress Notes (Signed)
   Subjective: 1 Day Post-Op Procedure(s) (LRB): LEFT TOTAL KNEE ARTHROPLASTY (Left) Patient reports pain as mild.   Patient seen in rounds for Dr. Wynelle Link. Patient is well, but has had some minor complaints of pain in the knee, requiring pain medications We will resume therapy today.  She walked 50 feet day of surgery. Plan is to go Home after hospital stay.  Objective: Vital signs in last 24 hours: Temp:  [97.6 F (36.4 C)-98.3 F (36.8 C)] 98.3 F (36.8 C) (12/05 0630) Pulse Rate:  [55-70] 60 (12/05 0630) Resp:  [12-23] 16 (12/05 0630) BP: (107-161)/(44-101) 112/46 (12/05 0630) SpO2:  [92 %-100 %] 94 % (12/05 0630) Weight:  [99.8 kg (220 lb)] 99.8 kg (220 lb) (12/04 1000)  Intake/Output from previous day:  Intake/Output Summary (Last 24 hours) at 07/29/16 0823 Last data filed at 07/29/16 0650  Gross per 24 hour  Intake             3640 ml  Output             3395 ml  Net              245 ml    Intake/Output this shift: No intake/output data recorded.  Labs:  Recent Labs  07/29/16 0504  HGB 10.7*    Recent Labs  07/29/16 0504  WBC 14.7*  RBC 3.58*  HCT 33.0*  PLT 242    Recent Labs  07/29/16 0504  NA 135  K 4.2  CL 103  CO2 24  BUN 19  CREATININE 0.96  GLUCOSE 153*  CALCIUM 7.9*    Recent Labs  07/28/16 0541  INR 1.02    EXAM General - Patient is Alert, Appropriate and Oriented Extremity - Neurovascular intact Sensation intact distally Intact pulses distally Dorsiflexion/Plantar flexion intact Dressing - dressing C/D/I Motor Function - intact, moving foot and toes well on exam.  Hemovac pulled without difficulty.  Past Medical History:  Diagnosis Date  . Anxiety   . Arthritis 02-25-12   arthritis-neck, fingers, hips, knees  . Chronic kidney disease 02-25-12   kidney stones- litho's x2  . Depression   . Dysrhythmia 2006   hx. A. Fib, no recent problems -rate control-NSR  at present, 2 or 3 events 2017  . History of blood  transfusion 1978 and 1981  . History of skin cancer   . Hypertension   . Pneumonia as child  . Pre-diabetes   . Rash    red area chest from heart monitor on chest and abdomen areas healing  . Sleep apnea 02-25-12   uses cpap since 2000 setting between 9 and 13    Assessment/Plan: 1 Day Post-Op Procedure(s) (LRB): LEFT TOTAL KNEE ARTHROPLASTY (Left) Active Problems:   OA (osteoarthritis) of knee  Estimated body mass index is 36.61 kg/m as calculated from the following:   Height as of this encounter: 5\' 5"  (1.651 m).   Weight as of this encounter: 99.8 kg (220 lb). Advance diet Up with therapy Plan for discharge tomorrow Discharge home - plans to go straight to outpatient therapy  DVT Prophylaxis - Eliquis Weight-Bearing as tolerated to left leg D/C O2 and Pulse OX and try on Room Air  [name], PA-C Orthopaedic Surgery 07/29/2016, 8:23 AM

## 2016-07-29 NOTE — Progress Notes (Signed)
Physical Therapy Treatment Patient Details Name: Shannon Aguilar MRN: [id number] DOB: 04-Aug-1946 Today's Date: 2016-07-30    History of Present Illness Pt is a 70 year old female s/p L TKA with hx of R TKA 11/19/15    PT Comments    Pt ambulated in hallway and performed LE exercises.  Pt reports d/c likely tomorrow.  Pt progressing well.  Follow Up Recommendations  Outpatient PT     Equipment Recommendations  None recommended by PT    Recommendations for Other Services       Precautions / Restrictions Precautions Precautions: Fall;Knee Precaution Comments: able to perform SLR Restrictions Weight Bearing Restrictions: No Other Position/Activity Restrictions: WBAT    Mobility  Bed Mobility Overal bed mobility: Needs Assistance Bed Mobility: Supine to Sit     Supine to sit: Supervision        Transfers Overall transfer level: Needs assistance Equipment used: Rolling walker (2 wheeled) Transfers: Sit to/from Stand Sit to Stand: Min guard         General transfer comment: verbal cues for safe technique  Ambulation/Gait Ambulation/Gait assistance: Min guard Ambulation Distance (Feet): 160 Feet Assistive device: Rolling walker (2 wheeled) Gait Pattern/deviations: Step-through pattern;Decreased stance time - left     General Gait Details: verbal cues for RW positioning, posture   Stairs            Wheelchair Mobility    Modified Rankin (Stroke Patients Only)       Balance                                    Cognition Arousal/Alertness: Awake/alert Behavior During Therapy: WFL for tasks assessed/performed Overall Cognitive Status: Within Functional Limits for tasks assessed                      Exercises Total Joint Exercises Ankle Circles/Pumps: AROM;Both;10 reps Quad Sets: AROM;Left;10 reps Short Arc Quad: AROM;Left;10 reps Heel Slides: AAROM;Left;10 reps Hip ABduction/ADduction: AROM;Left;10 reps Straight Leg  Raises: AROM;Left;10 reps Goniometric ROM: L knee AAROM flexion 45* supine    General Comments        Pertinent Vitals/Pain Pain Assessment: 0-10 Pain Score: 3  Pain Location: L knee Pain Descriptors / Indicators: Aching;Sore Pain Intervention(s): Monitored during session;Limited activity within patient's tolerance;Ice applied;Repositioned    Home Living                      Prior Function            PT Goals (current goals can now be found in the care plan section) Progress towards PT goals: Progressing toward goals    Frequency    7X/week      PT Plan Current plan remains appropriate    Co-evaluation             End of Session   Activity Tolerance: Patient tolerated treatment well Patient left: in chair;with call bell/phone within reach     Time: 0939-1005 PT Time Calculation (min) (ACUTE ONLY): 26 min  Charges:  $Gait Training: 8-22 mins $Therapeutic Exercise: 8-22 mins                    G Codes:      Curtina Grills,[name] 30-Jul-2016, 10:52 AM [name], PT, DPT 07/30/16 Pager: [phone]

## 2016-07-29 NOTE — Progress Notes (Signed)
   07/29/16 1500  PT Visit Information  Last PT Received On 07/29/16  Assistance Needed +1  History of Present Illness Pt is a 70 year old female s/p L TKA with hx of R TKA 11/19/15  Subjective Data  Subjective Pt ambulated good distance in hallway and assisted back to bed.  Pt anticipates d/c home tomorrow.  Precautions  Precautions Fall;Knee  Precaution Comments able to perform SLR  Restrictions  Other Position/Activity Restrictions WBAT  Pain Assessment  Pain Assessment 0-10  Pain Score 4  Pain Location L knee  Pain Descriptors / Indicators Aching;Sore  Pain Intervention(s) Limited activity within patient's tolerance;Monitored during session;Repositioned  Cognition  Arousal/Alertness Awake/alert  Behavior During Therapy WFL for tasks assessed/performed  Overall Cognitive Status Within Functional Limits for tasks assessed  Bed Mobility  Overal bed mobility Modified Independent  Transfers  Overall transfer level Needs assistance  Equipment used Rolling walker (2 wheeled)  Transfers Sit to/from Stand  Sit to Stand Min guard  General transfer comment verbal cues for safe technique  Ambulation/Gait  Ambulation/Gait assistance Min guard  Ambulation Distance (Feet) 240 Feet  Assistive device Rolling walker (2 wheeled)  Gait Pattern/deviations Step-through pattern;Decreased stance time - left  General Gait Details verbal cues for RW positioning, posture  PT - End of Session  Activity Tolerance Patient tolerated treatment well  Patient left in bed;with call bell/phone within reach  PT - Assessment/Plan  PT Plan Current plan remains appropriate  PT Frequency (ACUTE ONLY) 7X/week  Follow Up Recommendations Outpatient PT  PT equipment None recommended by PT  PT Goal Progression  Progress towards PT goals Progressing toward goals  PT Time Calculation  PT Start Time (ACUTE ONLY) 1412  PT Stop Time (ACUTE ONLY) 1426  PT Time Calculation (min) (ACUTE ONLY) 14 min  PT General  Charges  $$ ACUTE PT VISIT 1 Procedure  PT Treatments  $Gait Training 8-22 mins   [name], PT, DPT 07/29/2016 Pager: [phone]

## 2016-07-29 NOTE — Discharge Instructions (Signed)
° °Dr. Frank Aluisio °Total Joint Specialist °[facility] °[address] °[place], [place] [address] °[phone] ° °TOTAL KNEE REPLACEMENT POSTOPERATIVE DIRECTIONS ° °Knee Rehabilitation, Guidelines Following Surgery  °Results after knee surgery are often greatly improved when you follow the exercise, range of motion and muscle strengthening exercises prescribed by your doctor. Safety measures are also important to protect the knee from further injury. Any time any of these exercises cause you to have increased pain or swelling in your knee joint, decrease the amount until you are comfortable again and slowly increase them. If you have problems or questions, call your caregiver or physical therapist for advice.  ° °HOME CARE INSTRUCTIONS  °Remove items at home which could result in a fall. This includes throw rugs or furniture in walking pathways.  °· ICE to the affected knee every three hours for 30 minutes at a time and then as needed for pain and swelling.  Continue to use ice on the knee for pain and swelling from surgery. You may notice swelling that will progress down to the foot and ankle.  This is normal after surgery.  Elevate the leg when you are not up walking on it.   °· Continue to use the breathing machine which will help keep your temperature down.  It is common for your temperature to cycle up and down following surgery, especially at night when you are not up moving around and exerting yourself.  The breathing machine keeps your lungs expanded and your temperature down. °· Do not place pillow under knee, focus on keeping the knee straight while resting ° °DIET °You may resume your previous home diet once your are discharged from the hospital. ° °DRESSING / WOUND CARE / SHOWERING °You may shower 3 days after surgery, but keep the wounds dry during showering.  You may use an occlusive plastic wrap (Press'n Seal for example), NO SOAKING/SUBMERGING IN THE BATHTUB.  If the  bandage gets wet, change with a clean dry gauze.  If the incision gets wet, pat the wound dry with a clean towel. °You may start showering once you are discharged home but do not submerge the incision under water. Just pat the incision dry and apply a dry gauze dressing on daily. °Change the surgical dressing daily and reapply a dry dressing each time. ° °ACTIVITY °Walk with your walker as instructed. °Use walker as long as suggested by your caregivers. °Avoid periods of inactivity such as sitting longer than an hour when not asleep. This helps prevent blood clots.  °You may resume a sexual relationship in one month or when given the OK by your doctor.  °You may return to work once you are cleared by your doctor.  °Do not drive a car for 6 weeks or until released by you surgeon.  °Do not drive while taking narcotics. ° °WEIGHT BEARING °Weight bearing as tolerated with assist device (walker, cane, etc) as directed, use it as long as suggested by your surgeon or therapist, typically at least 4-6 weeks. ° °POSTOPERATIVE CONSTIPATION PROTOCOL °Constipation - defined medically as fewer than three stools per week and severe constipation as less than one stool per week. ° °One of the most common issues patients have following surgery is constipation.  Even if you have a regular bowel pattern at home, your normal regimen is likely to be disrupted due to multiple reasons following surgery.  Combination of anesthesia, postoperative narcotics, change in appetite and fluid intake all can affect your bowels.    In order to avoid complications following surgery, here are some recommendations in order to help you during your recovery period. ° °Colace (docusate) - Pick up an over-the-counter form of Colace or another stool softener and take twice a day as long as you are requiring postoperative pain medications.  Take with a full glass of water daily.  If you experience loose stools or diarrhea, hold the colace until you stool forms  back up.  If your symptoms do not get better within 1 week or if they get worse, check with your doctor. ° °Dulcolax (bisacodyl) - Pick up over-the-counter and take as directed by the product packaging as needed to assist with the movement of your bowels.  Take with a full glass of water.  Use this product as needed if not relieved by Colace only.  ° °MiraLax (polyethylene glycol) - Pick up over-the-counter to have on hand.  MiraLax is a solution that will increase the amount of water in your bowels to assist with bowel movements.  Take as directed and can mix with a glass of water, juice, soda, coffee, or tea.  Take if you go more than two days without a movement. °Do not use MiraLax more than once per day. Call your doctor if you are still constipated or irregular after using this medication for 7 days in a row. ° °If you continue to have problems with postoperative constipation, please contact the office for further assistance and recommendations.  If you experience "the worst abdominal pain ever" or develop nausea or vomiting, please contact the office immediatly for further recommendations for treatment. ° °ITCHING ° If you experience itching with your medications, try taking only a single pain pill, or even half a pain pill at a time.  You can also use Benadryl over the counter for itching or also to help with sleep.  ° °TED HOSE STOCKINGS °Wear the elastic stockings on both legs for three weeks following surgery during the day but you may remove then at night for sleeping. ° °MEDICATIONS °See your medication summary on the “After Visit Summary” that the nursing staff will review with you prior to discharge.  You may have some home medications which will be placed on hold until you complete the course of blood thinner medication.  It is important for you to complete the blood thinner medication as prescribed by your surgeon.  Continue your approved medications as instructed at time of  discharge. ° °PRECAUTIONS °If you experience chest pain or shortness of breath - call 911 immediately for transfer to the hospital emergency department.  °If you develop a fever greater that 101 F, purulent drainage from wound, increased redness or drainage from wound, foul odor from the wound/dressing, or calf pain - CONTACT YOUR SURGEON.   °                                                °FOLLOW-UP APPOINTMENTS °Make sure you keep all of your appointments after your operation with your surgeon and caregivers. You should call the office at the above phone number and make an appointment for approximately two weeks after the date of your surgery or on the date instructed by your surgeon outlined in the "After Visit Summary". ° ° °RANGE OF MOTION AND STRENGTHENING EXERCISES  °Rehabilitation of the knee is important following a knee injury or   an operation. After just a few days of immobilization, the muscles of the thigh which control the knee become weakened and shrink (atrophy). Knee exercises are designed to build up the tone and strength of the thigh muscles and to improve knee motion. Often times heat used for twenty to thirty minutes before working out will loosen up your tissues and help with improving the range of motion but do not use heat for the first two weeks following surgery. These exercises can be done on a training (exercise) mat, on the floor, on a table or on a bed. Use what ever works the best and is most comfortable for you Knee exercises include:  Leg Lifts - While your knee is still immobilized in a splint or cast, you can do straight leg raises. Lift the leg to 60 degrees, hold for 3 sec, and slowly lower the leg. Repeat 10-20 times 2-3 times daily. Perform this exercise against resistance later as your knee gets better.  Quad and Hamstring Sets - Tighten up the muscle on the front of the thigh (Quad) and hold for 5-10 sec. Repeat this 10-20 times hourly. Hamstring sets are done by pushing the  foot backward against an object and holding for 5-10 sec. Repeat as with quad sets.   Leg Slides: Lying on your back, slowly slide your foot toward your buttocks, bending your knee up off the floor (only go as far as is comfortable). Then slowly slide your foot back down until your leg is flat on the floor again.  Angel Wings: Lying on your back spread your legs to the side as far apart as you can without causing discomfort.  A rehabilitation program following serious knee injuries can speed recovery and prevent re-injury in the future due to weakened muscles. Contact your doctor or a physical therapist for more information on knee rehabilitation.   IF YOU ARE TRANSFERRED TO A SKILLED REHAB FACILITY If the patient is transferred to a skilled rehab facility following release from the hospital, a list of the current medications will be sent to the facility for the patient to continue.  When discharged from the skilled rehab facility, please have the facility set up the patient's [place] prior to being released. Also, the skilled facility will be responsible for providing the patient with their medications at time of release from the facility to include their pain medication, the muscle relaxants, and their blood thinner medication. If the patient is still at the rehab facility at time of the two week follow up appointment, the skilled rehab facility will also need to assist the patient in arranging follow up appointment in our office and any transportation needs.  MAKE SURE YOU:  Understand these instructions.  Get help right away if you are not doing well or get worse.    Pick up stool softner and laxative for home use following surgery while on pain medications. Do not submerge incision under water. Please use good hand washing techniques while changing dressing each day. May shower starting three days after surgery. Please use a clean towel to pat the incision dry following  showers. Continue to use ice for pain and swelling after surgery. Do not use any lotions or creams on the incision until instructed by your surgeon.  Resume Eliquis 5 mg twice a day at discharge.

## 2016-07-29 NOTE — Discharge Summary (Signed)
Physician Discharge Summary   Patient ID: Shannon Aguilar MRN: [id number] DOB/AGE: 10-29-1945 70 y.o.  Admit date: 07/28/2016 Discharge date: 07-30-2016  Primary Diagnosis:  Osteoarthritis  Left knee(s)  Admission Diagnoses:  Past Medical History:  Diagnosis Date  . Anxiety   . Arthritis 02-25-12   arthritis-neck, fingers, hips, knees  . Chronic kidney disease 02-25-12   kidney stones- litho's x2  . Depression   . Dysrhythmia 2006   hx. A. Fib, no recent problems -rate control-NSR  at present, 2 or 3 events 2017  . History of blood transfusion 1978 and 1981  . History of skin cancer   . Hypertension   . Pneumonia as child  . Pre-diabetes   . Rash    red area chest from heart monitor on chest and abdomen areas healing  . Sleep apnea 02-25-12   uses cpap since 2000 setting between 9 and 13   Discharge Diagnoses:   Active Problems:   OA (osteoarthritis) of knee  Estimated body mass index is 36.61 kg/m as calculated from the following:   Height as of this encounter: _0  (1.651 m).   Weight as of this encounter: 99.8 kg (220 lb).  Procedure:  Procedure(s) (LRB): LEFT TOTAL KNEE ARTHROPLASTY (Left)   Consults: None  HPI: Shannon Aguilar is a 70 y.o. year old female with end stage OA of her left knee with progressively worsening pain and dysfunction. She has constant pain, with activity and at rest and significant functional deficits with difficulties even with ADLs. She has had extensive non-op management including analgesics, injections of cortisone and viscosupplements, and home exercise program, but remains in significant pain with significant dysfunction. Radiographs show bone on bone arthritis medial and patellofemoral. She presents now for left Total Knee Arthroplasty.    Laboratory Data: Admission on 07/28/2016  Component Date Value Ref Range Status  . Glucose-Capillary 07/28/2016 110* 65 - 99 mg/dL Final  . Comment 1 07/28/2016 Notify RN   Final  . Prothrombin Time  07/28/2016 13.4  11.4 - 15.2 seconds Final  . INR 07/28/2016 1.02   Final  . Glucose-Capillary 07/28/2016 137* 65 - 99 mg/dL Final  . Comment 1 07/28/2016 Notify RN   Final  . Comment 2 07/28/2016 Document in Chart   Final  . WBC 07/29/2016 14.7* 4.0 - 10.5 K/uL Final  . RBC 07/29/2016 3.58* 3.87 - 5.11 MIL/uL Final  . Hemoglobin 07/29/2016 10.7* 12.0 - 15.0 g/dL Final  . HCT 07/29/2016 33.0* 36.0 - 46.0 % Final  . MCV 07/29/2016 92.2  78.0 - 100.0 fL Final  . MCH 07/29/2016 29.9  26.0 - 34.0 pg Final  . MCHC 07/29/2016 32.4  30.0 - 36.0 g/dL Final  . RDW 07/29/2016 12.7  11.5 - 15.5 % Final  . Platelets 07/29/2016 242  150 - 400 K/uL Final  . Sodium 07/29/2016 135  135 - 145 mmol/L Final  . Potassium 07/29/2016 4.2  3.5 - 5.1 mmol/L Final  . Chloride 07/29/2016 103  101 - 111 mmol/L Final  . CO2 07/29/2016 24  22 - 32 mmol/L Final  . Glucose, Bld 07/29/2016 153* 65 - 99 mg/dL Final  . BUN 07/29/2016 19  6 - 20 mg/dL Final  . Creatinine, Ser 07/29/2016 0.96  0.44 - 1.00 mg/dL Final  . Calcium 07/29/2016 7.9* 8.9 - 10.3 mg/dL Final  . GFR calc non Af Amer 07/29/2016 59* >60 mL/min Final  . GFR calc Af Amer 07/29/2016 >60  >60 mL/min Final  Comment: (NOTE) The eGFR has been calculated using the CKD EPI equation. This calculation has not been validated in all clinical situations. eGFR's persistently <60 mL/min signify possible Chronic Kidney Disease.   [name] gap 07/29/2016 8  5 - 15 Final  Hospital Outpatient Visit on 07/22/2016  Component Date Value Ref Range Status  . Hgb A1c MFr Bld 07/24/2016 5.8* 4.8 - 5.6 % Final   Comment: (NOTE)         Pre-diabetes: 5.7 - 6.4         Diabetes: >6.4         Glycemic control for adults with diabetes: <7.0   . Mean Plasma Glucose 07/24/2016 120  mg/dL Final   Comment: (NOTE) Performed At: [facility] [place], [place] [id number] Lindon Romp MD [id number]   . aPTT 07/22/2016 33  24 - 36 seconds Final    . WBC 07/22/2016 10.1  4.0 - 10.5 K/uL Final  . RBC 07/22/2016 4.45  3.87 - 5.11 MIL/uL Final  . Hemoglobin 07/22/2016 13.2  12.0 - 15.0 g/dL Final  . HCT 07/22/2016 41.0  36.0 - 46.0 % Final  . MCV 07/22/2016 92.1  78.0 - 100.0 fL Final  . MCH 07/22/2016 29.7  26.0 - 34.0 pg Final  . MCHC 07/22/2016 32.2  30.0 - 36.0 g/dL Final  . RDW 07/22/2016 12.8  11.5 - 15.5 % Final  . Platelets 07/22/2016 285  150 - 400 K/uL Final  . Sodium 07/22/2016 138  135 - 145 mmol/L Final  . Potassium 07/22/2016 4.1  3.5 - 5.1 mmol/L Final  . Chloride 07/22/2016 103  101 - 111 mmol/L Final  . CO2 07/22/2016 28  22 - 32 mmol/L Final  . Glucose, Bld 07/22/2016 85  65 - 99 mg/dL Final  . BUN 07/22/2016 26* 6 - 20 mg/dL Final  . Creatinine, Ser 07/22/2016 0.96  0.44 - 1.00 mg/dL Final  . Calcium 07/22/2016 9.1  8.9 - 10.3 mg/dL Final  . Total Protein 07/22/2016 7.5  6.5 - 8.1 g/dL Final  . Albumin 07/22/2016 4.3  3.5 - 5.0 g/dL Final  . AST 07/22/2016 33  15 - 41 U/L Final  . ALT 07/22/2016 45  14 - 54 U/L Final  . Alkaline Phosphatase 07/22/2016 77  38 - 126 U/L Final  . Total Bilirubin 07/22/2016 1.5* 0.3 - 1.2 mg/dL Final  . GFR calc non Af Amer 07/22/2016 59* >60 mL/min Final  . GFR calc Af Amer 07/22/2016 >60  >60 mL/min Final   Comment: (NOTE) The eGFR has been calculated using the CKD EPI equation. This calculation has not been validated in all clinical situations. eGFR's persistently <60 mL/min signify possible Chronic Kidney Disease.   . Anion gap 07/22/2016 7  5 - 15 Final  . Prothrombin Time 07/22/2016 15.7* 11.4 - 15.2 seconds Final  . INR 07/22/2016 1.25   Final  . ABO/RH(D) 07/28/2016 A NEG   Final  . Antibody Screen 07/28/2016 NEG   Final  . Sample Expiration 07/28/2016 07/31/2016   Final  . Extend sample reason 07/28/2016 NO TRANSFUSIONS OR PREGNANCY IN THE PAST 3 MONTHS   Final  . Color, Urine 07/22/2016 YELLOW  YELLOW Final  . APPearance 07/22/2016 CLEAR  CLEAR Final  . Specific  Gravity, Urine 07/22/2016 1.005  1.005 - 1.030 Final  . pH 07/22/2016 7.0  5.0 - 8.0 Final  . Glucose, UA 07/22/2016 NEGATIVE  NEGATIVE mg/dL Final  . Hgb urine dipstick 07/22/2016  NEGATIVE  NEGATIVE Final  . Bilirubin Urine 07/22/2016 NEGATIVE  NEGATIVE Final  . Ketones, ur 07/22/2016 NEGATIVE  NEGATIVE mg/dL Final  . Protein, ur 07/22/2016 NEGATIVE  NEGATIVE mg/dL Final  . Nitrite 07/22/2016 NEGATIVE  NEGATIVE Final  . Leukocytes, UA 07/22/2016 MODERATE* NEGATIVE Final  . TSH 07/22/2016 2.532  0.350 - 4.500 uIU/mL Final  . MRSA, PCR 07/22/2016 NEGATIVE  NEGATIVE Final  . Staphylococcus aureus 07/22/2016 NEGATIVE  NEGATIVE Final   Comment:        The Xpert SA Assay (FDA approved for NASAL specimens in patients over 85 years of age), is one component of a comprehensive surveillance program.  Test performance has been validated by [facility] for patients greater than or equal to 44 year old. It is not intended to diagnose infection nor to guide or monitor treatment.   . Squamous Epithelial / LPF 07/22/2016 0-5* NONE SEEN Final  . WBC, UA 07/22/2016 6-30  0 - 5 WBC/hpf Final  . RBC / HPF 07/22/2016 NONE SEEN  0 - 5 RBC/hpf Final  . Bacteria, UA 07/22/2016 NONE SEEN  NONE SEEN Final     X-Rays:No results found.  EKG: Orders placed or performed during the hospital encounter of 05/17/16  . ED EKG  . ED EKG     Hospital Course: Shannon Aguilar is a 70 y.o. who was admitted to [facility]. They were brought to the operating room on 07/28/2016 and underwent Procedure(s): LEFT TOTAL KNEE ARTHROPLASTY.  Patient tolerated the procedure well and was later transferred to the recovery room and then to the orthopaedic floor for postoperative care.  They were given PO and IV analgesics for pain control following their surgery.  They were given 24 hours of postoperative antibiotics of  Anti-infectives    Start     Dose/Rate Route Frequency Ordered Stop   07/28/16 2000  vancomycin  (VANCOCIN) IVPB 1000 mg/200 mL premix     1,000 mg 200 mL/hr over 60 Minutes Intravenous Every 12 hours 07/28/16 1046 07/28/16 2032   07/28/16 0529  vancomycin (VANCOCIN) IVPB 1000 mg/200 mL premix     1,000 mg 200 mL/hr over 60 Minutes Intravenous On call to O.R. 07/28/16 1308 07/28/16 0745     and started on DVT prophylaxis in the form of eliquis.   PT and OT were ordered for total joint protocol.  Discharge planning consulted to help with postop disposition and equipment needs.  Patient had a good night on the evening of surgery.  They started to get up OOB with therapy on day one. Hemovac drain was pulled without difficulty.  Continued to work with therapy into day two.  Dressing was changed on day two and the incision was healing well.   Patient was seen in rounds and was ready to go home.  Discharge home  Diet - Cardiac diet and Renal diet Follow up - in 2 weeks Activity - WBAT Disposition - Home Condition Upon Discharge - Good D/C Meds - See DC Summary DVT Prophylaxis - Eliquis  Discharge Instructions    Call MD / Call 911    Complete by:  As directed    If you experience chest pain or shortness of breath, CALL 911 and be transported to the hospital emergency room.  If you develope a fever above 101 F, pus (white drainage) or increased drainage or redness at the wound, or calf pain, call your surgeon's office.   Change dressing    Complete by:  As  directed    Change dressing daily with sterile 4 x 4 inch gauze dressing and apply TED hose. Do not submerge the incision under water.   Constipation Prevention    Complete by:  As directed    Drink plenty of fluids.  Prune juice may be helpful.  You may use a stool softener, such as Colace (over the counter) 100 mg twice a day.  Use MiraLax (over the counter) for constipation as needed.   Diet - low sodium heart healthy    Complete by:  As directed    Diet Carb Modified    Complete by:  As directed    Discharge instructions     Complete by:  As directed    Pick up stool softner and laxative for home use following surgery while on pain medications. Do not submerge incision under water. Please use good hand washing techniques while changing dressing each day. May shower starting three days after surgery. Please use a clean towel to pat the incision dry following showers. Continue to use ice for pain and swelling after surgery. Do not use any lotions or creams on the incision until instructed by your surgeon.   Postoperative Constipation Protocol  Constipation - defined medically as fewer than three stools per week and severe constipation as less than one stool per week.  One of the most common issues patients have following surgery is constipation.  Even if you have a regular bowel pattern at home, your normal regimen is likely to be disrupted due to multiple reasons following surgery.  Combination of anesthesia, postoperative narcotics, change in appetite and fluid intake all can affect your bowels.  In order to avoid complications following surgery, here are some recommendations in order to help you during your recovery period.  Colace (docusate) - Pick up an over-the-counter form of Colace or another stool softener and take twice a day as long as you are requiring postoperative pain medications.  Take with a full glass of water daily.  If you experience loose stools or diarrhea, hold the colace until you stool forms back up.  If your symptoms do not get better within 1 week or if they get worse, check with your doctor.  Dulcolax (bisacodyl) - Pick up over-the-counter and take as directed by the product packaging as needed to assist with the movement of your bowels.  Take with a full glass of water.  Use this product as needed if not relieved by Colace only.   MiraLax (polyethylene glycol) - Pick up over-the-counter to have on hand.  MiraLax is a solution that will increase the amount of water in your bowels to assist  with bowel movements.  Take as directed and can mix with a glass of water, juice, soda, coffee, or tea.  Take if you go more than two days without a movement. Do not use MiraLax more than once per day. Call your doctor if you are still constipated or irregular after using this medication for 7 days in a row.  If you continue to have problems with postoperative constipation, please contact the office for further assistance and recommendations.  If you experience "the worst abdominal pain ever" or develop nausea or vomiting, please contact the office immediatly for further recommendations for treatment.   Resume the Eliquis  5 mg twice a day   Do not put a pillow under the knee. Place it under the heel.    Complete by:  As directed    Do not sit  on low chairs, stoools or toilet seats, as it may be difficult to get up from low surfaces    Complete by:  As directed    Driving restrictions    Complete by:  As directed    No driving until released by the physician.   Increase activity slowly as tolerated    Complete by:  As directed    Lifting restrictions    Complete by:  As directed    No lifting until released by the physician.   Patient may shower    Complete by:  As directed    You may shower without a dressing once there is no drainage.  Do not wash over the wound.  If drainage remains, do not shower until drainage stops.   TED hose    Complete by:  As directed    Use stockings (TED hose) for 3 weeks on both leg(s).  You may remove them at night for sleeping.   Weight bearing as tolerated    Complete by:  As directed    Laterality:  left   Extremity:  Lower       Medication List    STOP taking these medications   ciprofloxacin 500 MG tablet Commonly known as:  CIPRO     TAKE these medications   acetaminophen 500 MG tablet Commonly known as:  TYLENOL Take 1,000 mg by mouth every 6 (six) hours as needed for mild pain.   ALLERGY PO Take 1 tablet by mouth daily as needed  (allergies).   amLODipine 2.5 MG tablet Commonly known as:  NORVASC Take 2.5 mg by mouth daily.   apixaban 5 MG Tabs tablet Commonly known as:  ELIQUIS Take 1 tablet (5 mg total) by mouth 2 (two) times daily.   citalopram 20 MG tablet Commonly known as:  CELEXA Take 20 mg by mouth every evening.   LIVALO 2 MG Tabs Generic drug:  Pitavastatin Calcium Take 2 mg by mouth at bedtime.   methocarbamol 500 MG tablet Commonly known as:  ROBAXIN Take 1 tablet (500 mg total) by mouth every 6 (six) hours as needed for muscle spasms.   metoprolol tartrate 25 MG tablet Commonly known as:  LOPRESSOR Take 1 tablet (25 mg total) by mouth 2 (two) times daily.   oxyCODONE 5 MG immediate release tablet Commonly known as:  Oxy IR/ROXICODONE Take 1-2 tablets (5-10 mg total) by mouth every 3 (three) hours as needed for moderate pain or severe pain.   SYSTANE OP Apply 1 drop to eye 2 (two) times daily as needed (for dry eyes). Dry eyes   triamterene-hydrochlorothiazide 75-50 MG tablet Commonly known as:  MAXZIDE Take 1 tablet by mouth daily.      Follow-up Information    Gearlean Alf, MD. Schedule an appointment as soon as possible for a visit on 08/12/2016.   Specialty:  Orthopedic Surgery Contact information: [address] [place] [address] [phone]           Signed: [name], PA-C Orthopaedic Surgery 07/29/2016, 9:49 PM

## 2016-07-30 ENCOUNTER — Telehealth: Payer: Self-pay | Admitting: Pediatrics

## 2016-07-30 ENCOUNTER — Telehealth: Payer: Self-pay | Admitting: Interventional Cardiology

## 2016-07-30 DIAGNOSIS — I480 Paroxysmal atrial fibrillation: Secondary | ICD-10-CM

## 2016-07-30 LAB — BASIC METABOLIC PANEL
ANION GAP: 8 (ref 5–15)
BUN: 19 mg/dL (ref 6–20)
CHLORIDE: 108 mmol/L (ref 101–111)
CO2: 25 mmol/L (ref 22–32)
Calcium: 8.2 mg/dL — ABNORMAL LOW (ref 8.9–10.3)
Creatinine, Ser: 0.78 mg/dL (ref 0.44–1.00)
GFR calc non Af Amer: >60 mL/min (ref >60)
GLUCOSE: 117 mg/dL — AB (ref 65–99)
POTASSIUM: 4.3 mmol/L (ref 3.5–5.1)
Sodium: 141 mmol/L (ref 135–145)

## 2016-07-30 LAB — CBC
HEMATOCRIT: 32.3 % — AB (ref 36.0–46.0)
HEMOGLOBIN: 10.5 g/dL — AB (ref 12.0–15.0)
MCH: 30.2 pg (ref 26.0–34.0)
MCHC: 32.5 g/dL (ref 30.0–36.0)
MCV: 92.8 fL (ref 78.0–100.0)
Platelets: 220 10*3/uL (ref 150–400)
RBC: 3.48 MIL/uL — ABNORMAL LOW (ref 3.87–5.11)
RDW: 13 % (ref 11.5–15.5)
WBC: 15.7 10*3/uL — AB (ref 4.0–10.5)

## 2016-07-30 MED ORDER — APIXABAN 5 MG PO TABS: 5.0000 mg | ORAL_TABLET | Freq: Two times a day (BID) | ORAL | 3 refills | Status: DC

## 2016-07-30 MED ORDER — METHOCARBAMOL 500 MG PO TABS: 500.0000 mg | ORAL_TABLET | Freq: Four times a day (QID) | ORAL | 0 refills | Status: DC | PRN

## 2016-07-30 MED ORDER — OXYCODONE HCL 5 MG PO TABS: 5.0000 mg | ORAL_TABLET | ORAL | 0 refills | Status: DC | PRN

## 2016-07-30 NOTE — Progress Notes (Signed)
   Subjective: 2 Days Post-Op Procedure(s) (LRB): LEFT TOTAL KNEE ARTHROPLASTY (Left) Patient reports pain as mild.   Patient seen in rounds with Dr. Wynelle Link. Patient is well, and has had no acute complaints or problems Patient is ready to go home  Objective: Vital signs in last 24 hours: Temp:  [98.2 F (36.8 C)-98.7 F (37.1 C)] 98.5 F (36.9 C) (12/06 0652) Pulse Rate:  [61-73] 61 (12/06 0652) Resp:  [16-17] 16 (12/06 0652) BP: (99-128)/(49-62) 99/50 (12/06 0652) SpO2:  [92 %-100 %] 100 % (12/06 0652)  Intake/Output from previous day:  Intake/Output Summary (Last 24 hours) at 07/30/16 1049 Last data filed at 07/30/16 0801  Gross per 24 hour  Intake              960 ml  Output             2300 ml  Net            -1340 ml    Intake/Output this shift: Total I/O In: 240 [P.O.:240] Out: -   Labs:  Recent Labs  07/29/16 0504 07/30/16 0422  HGB 10.7* 10.5*    Recent Labs  07/29/16 0504 07/30/16 0422  WBC 14.7* 15.7*  RBC 3.58* 3.48*  HCT 33.0* 32.3*  PLT 242 220    Recent Labs  07/29/16 0504 07/30/16 0422  NA 135 141  K 4.2 4.3  CL 103 108  CO2 24 25  BUN 19 19  CREATININE 0.96 0.78  GLUCOSE 153* 117*  CALCIUM 7.9* 8.2*    Recent Labs  07/28/16 0541  INR 1.02    EXAM: General - Patient is Alert and Appropriate Extremity - Neurovascular intact Sensation intact distally Dorsiflexion/Plantar flexion intact Incision - clean, dry, no drainage Motor Function - intact, moving foot and toes well on exam.   Assessment/Plan: 2 Days Post-Op Procedure(s) (LRB): LEFT TOTAL KNEE ARTHROPLASTY (Left) Procedure(s) (LRB): LEFT TOTAL KNEE ARTHROPLASTY (Left) Past Medical History:  Diagnosis Date  . Anxiety   . Arthritis 02-25-12   arthritis-neck, fingers, hips, knees  . Chronic kidney disease 02-25-12   kidney stones- litho's x2  . Depression   . Dysrhythmia 2006   hx. A. Fib, no recent problems -rate control-NSR  at present, 2 or 3 events 2017  .  History of blood transfusion 1978 and 1981  . History of skin cancer   . Hypertension   . Pneumonia as child  . Pre-diabetes   . Rash    red area chest from heart monitor on chest and abdomen areas healing  . Sleep apnea 02-25-12   uses cpap since 2000 setting between 9 and 13   Active Problems:   OA (osteoarthritis) of knee  Estimated body mass index is 36.61 kg/m as calculated from the following:   Height as of this encounter: 5\' 5"  (1.651 m).   Weight as of this encounter: 99.8 kg (220 lb). Up with therapy Discharge home  Diet - Cardiac diet and Renal diet Follow up - in 2 weeks Activity - WBAT Disposition - Home Condition Upon Discharge - Good D/C Meds - See DC Summary DVT Prophylaxis - Eliquis  [name], PA-C Orthopaedic Surgery 07/30/2016, 10:49 AM

## 2016-07-30 NOTE — Telephone Encounter (Signed)
Please schedule 2D ECHO. Thanks!!

## 2016-07-30 NOTE — Telephone Encounter (Signed)
-----   Message from Jettie Booze, MD sent at 07/30/2016  1:09 PM EST ----- Complete 2D echo. Thanks

## 2016-07-30 NOTE — Progress Notes (Signed)
Physical Therapy Treatment Patient Details Name: Shannon Aguilar MRN: [id number] DOB: 1945/09/09 Today's Date: 2016/08/27    History of Present Illness Pt is a 70 year old female s/p L TKA with hx of R TKA 11/19/15    PT Comments    Pt ambulated in hallway, practiced safe stair technique, and performed LE exercises.  Pt feels ready for d/c home.  Follow Up Recommendations  Outpatient PT     Equipment Recommendations  None recommended by PT    Recommendations for Other Services       Precautions / Restrictions Precautions Precautions: Fall;Knee Precaution Comments: able to perform SLR Restrictions Other Position/Activity Restrictions: WBAT    Mobility  Bed Mobility Overal bed mobility: Modified Independent                Transfers Overall transfer level: Needs assistance Equipment used: Rolling walker (2 wheeled) Transfers: Sit to/from Stand Sit to Stand: Supervision         General transfer comment: verbal cues for safe technique  Ambulation/Gait Ambulation/Gait assistance: Supervision Ambulation Distance (Feet): 240 Feet Assistive device: Rolling walker (2 wheeled) Gait Pattern/deviations: Step-through pattern     General Gait Details: smooth gait with RW   Stairs Stairs: Yes Stairs assistance: Min guard Stair Management: Two rails;Step to pattern;Forwards Number of Stairs: 5 ((3 and 2)) General stair comments: verbal cues for safety and sequence  Wheelchair Mobility    Modified Rankin (Stroke Patients Only)       Balance                                    Cognition Arousal/Alertness: Awake/alert Behavior During Therapy: WFL for tasks assessed/performed Overall Cognitive Status: Within Functional Limits for tasks assessed                      Exercises Total Joint Exercises Ankle Circles/Pumps: AROM;Both;10 reps Quad Sets: AROM;Left;10 reps Short Arc Quad: AROM;Left;10 reps Heel Slides: AAROM;Left;10  reps;Seated Hip ABduction/ADduction: AROM;Left;10 reps Straight Leg Raises: AROM;Left;10 reps    General Comments        Pertinent Vitals/Pain Pain Assessment: 0-10 Pain Score: 4  Pain Location: L knee Pain Descriptors / Indicators: Tightness;Sore Pain Intervention(s): Limited activity within patient's tolerance;Monitored during session;Repositioned    Home Living                      Prior Function            PT Goals (current goals can now be found in the care plan section) Progress towards PT goals: Progressing toward goals    Frequency    7X/week      PT Plan Current plan remains appropriate    Co-evaluation             End of Session Equipment Utilized During Treatment: Gait belt Activity Tolerance: Patient tolerated treatment well Patient left: with call bell/phone within reach;in chair     Time: [id number] PT Time Calculation (min) (ACUTE ONLY): 19 min  Charges:  $Gait Training: 8-22 mins                    G Codes:      [name],[name] 27-Aug-2016, 12:26 PM [name], PT, DPT 08/27/2016 Pager: [phone]

## 2016-07-30 NOTE — Telephone Encounter (Signed)
New message  Pt was discharged from hospital on 12/6, knee replacement  Pt is concerned about low BP readings 90/50  Please call and advise with readings from hospital

## 2016-07-30 NOTE — Telephone Encounter (Signed)
Patient can stop her Norvasc since her blood pressures are so low

## 2016-07-31 NOTE — Telephone Encounter (Signed)
I called patient to advised of cardiac event monitor results.  She informed me of the hypotension issue and states she never rec'd a call back from [place] about her medication. I advised her to stop Norvasc per Ermalinda Barrios, PA-C's note that was closed without calling the patient. She voiced understanding.

## 2016-08-08 ENCOUNTER — Encounter: Payer: Self-pay | Admitting: Interventional Cardiology

## 2016-08-20 ENCOUNTER — Other Ambulatory Visit: Payer: Self-pay

## 2016-08-20 ENCOUNTER — Ambulatory Visit (HOSPITAL_COMMUNITY): Payer: Medicare PPO | Attending: Internal Medicine

## 2016-08-20 DIAGNOSIS — I10 Essential (primary) hypertension: Secondary | ICD-10-CM | POA: Insufficient documentation

## 2016-08-20 DIAGNOSIS — Z6836 Body mass index (BMI) 36.0-36.9, adult: Secondary | ICD-10-CM | POA: Diagnosis not present

## 2016-08-20 DIAGNOSIS — E669 Obesity, unspecified: Secondary | ICD-10-CM | POA: Diagnosis not present

## 2016-08-20 DIAGNOSIS — Z87891 Personal history of nicotine dependence: Secondary | ICD-10-CM | POA: Diagnosis not present

## 2016-08-20 DIAGNOSIS — E785 Hyperlipidemia, unspecified: Secondary | ICD-10-CM | POA: Insufficient documentation

## 2016-08-20 DIAGNOSIS — I480 Paroxysmal atrial fibrillation: Secondary | ICD-10-CM | POA: Insufficient documentation

## 2016-08-27 NOTE — Progress Notes (Signed)
Cardiology Office Note   Date:  08/28/2016   ID:  [name], DOB 1945/08/29, MRN [id number]  PCP:  Shannon Kroner, MD    No chief complaint on file.  palpitations  Wt Readings from Last 3 Encounters:  07/28/16 220 lb (99.8 kg)  07/22/16 219 lb (99.3 kg)  07/07/16 222 lb (100.7 kg)       History of Present Illness: Shannon Aguilar is a 71 y.o. female  Who had TKR in 12/17.  She has had palpitations intermittently prior to this and had been diagnosed with PAF in 2006.    Her palpitations typically occured at night.  The worst episode was on 05/17/16.  It lasted 30 minutes.  HR up to 149 once after a cold, but it resolved on its own before she got to urgent care.    THe night time palpitations feel like a brief fluttering, up to 15 minutes.  This decreased since she decreased caffeine.    No CP, DOE.  No diaphoresis or nausea.    In 2006, she had a cardiac eval.  She had PAF at that time.  Echo and stress test were done at that time.  W/u was negative.  That occurred after a trip to [place].  Further eval showed PAF a few weeks ago in 12/17.  She has been on anticoagulation since that time.   In December 2017, she had knee replacement.  She is still doing physical therapy after her knee surgery. She has not resumed regular exercise. No further palpitations.  She thinks that when she resumes exercise, this may be when her A. fib comes back. Of note, she has a brother who will be having an A. fib ablation coming up.     Past Medical History:  Diagnosis Date  . Anxiety   . Arthritis 02-25-12   arthritis-neck, fingers, hips, knees  . Chronic kidney disease 02-25-12   kidney stones- litho's x2  . Depression   . Dysrhythmia 2006   hx. A. Fib, no recent problems -rate control-NSR  at present, 2 or 3 events 2017  . History of blood transfusion 1978 and 1981  . History of skin cancer   . Hypertension   . Pneumonia as child  . Pre-diabetes   . Rash    red area chest from  heart monitor on chest and abdomen areas healing  . Sleep apnea 02-25-12   uses cpap since 2000 setting between 9 and 13    Past Surgical History:  Procedure Laterality Date  . [place]   x2   . DILATION AND CURETTAGE OF UTERUS  02-25-12   abnormal vaginal bleeding- ? 5 yrs ago  . KNEE ARTHROSCOPY  03/03/2012   Procedure: ARTHROSCOPY KNEE;  Surgeon: Gearlean Alf, MD;  Location: [facility];  Service: Orthopedics;  Laterality: Right;  Right Knee Arthroscopy, medial meniscectomy and chrondroplasty  . LITHOTRIPSY  02-25-12   x2   . TONSILLECTOMY  02-25-12   child  . TOTAL KNEE ARTHROPLASTY Right 11/19/2015   Procedure: TOTAL KNEE ARTHROPLASTY;  Surgeon: Gaynelle Arabian, MD;  Location: [facility];  Service: Orthopedics;  Laterality: Right;  . TOTAL KNEE ARTHROPLASTY Left 07/28/2016   Procedure: LEFT TOTAL KNEE ARTHROPLASTY;  Surgeon: Gaynelle Arabian, MD;  Location: [facility];  Service: Orthopedics;  Laterality: Left;     Current Outpatient Prescriptions  Medication Sig Dispense Refill  . acetaminophen (TYLENOL) 500 MG tablet Take 1,000 mg by mouth every  6 (six) hours as needed for mild pain.    Marland Kitchen amLODipine (NORVASC) 2.5 MG tablet Take 2.5 mg by mouth daily.     Marland Kitchen apixaban (ELIQUIS) 5 MG TABS tablet Take 1 tablet (5 mg total) by mouth 2 (two) times daily. 60 tablet 3  . Chlorpheniramine Maleate (ALLERGY PO) Take 1 tablet by mouth daily as needed (allergies).    . citalopram (CELEXA) 20 MG tablet Take 20 mg by mouth every evening.     . methocarbamol (ROBAXIN) 500 MG tablet Take 1 tablet (500 mg total) by mouth every 6 (six) hours as needed for muscle spasms. 80 tablet 0  . metoprolol tartrate (LOPRESSOR) 25 MG tablet Take 1 tablet (25 mg total) by mouth 2 (two) times daily. 180 tablet 3  . oxyCODONE (OXY IR/ROXICODONE) 5 MG immediate release tablet Take 1-2 tablets (5-10 mg total) by mouth every 3 (three) hours as needed for moderate pain or severe pain. 80 tablet 0  . Pitavastatin  Calcium (LIVALO) 2 MG TABS Take 2 mg by mouth at bedtime.    Vladimir Faster Glycol-Propyl Glycol (SYSTANE OP) Apply 1 drop to eye 2 (two) times daily as needed (for dry eyes). Dry eyes    . triamterene-hydrochlorothiazide (MAXZIDE) 75-50 MG tablet Take 1 tablet by mouth daily.     No current facility-administered medications for this visit.     Allergies:   Milk-related compounds and Penicillins    Social History:  The patient  reports that she quit smoking about 25 years ago. She has never used smokeless tobacco. She reports that she drinks alcohol. She reports that she does not use drugs.   Family History:  The patient's family history includes Arthritis in her mother; Diabetes in her father; Heart attack in her father and mother; Heart disease in her father; Hypercholesterolemia in her sister; Hypertension in her brother, brother, and mother; Hypothyroidism in her sister; Prostate cancer in her father.    ROS:  Please see the history of present illness.   Otherwise, review of systems are positive for improving knee pain.   All other systems are reviewed and negative.    PHYSICAL EXAM: VS:  BP 110/66 (BP Location: Right Arm)   Pulse (!) 57   LMP  (Exact Date)  , BMI There is no height or weight on file to calculate BMI. GEN: Well nourished, well developed, in no acute distress  HEENT: normal  Neck: no JVD, carotid bruits, or masses Cardiac: RRR; no murmurs, rubs, or gallops,no edema  Respiratory:  clear to auscultation bilaterally, normal work of breathing GI: soft, nontender, nondistended, + BS MS: no deformity or atrophy  Skin: warm and dry, no rash Neuro:  Strength and sensation are intact Psych: euthymic mood, full affect   EKG:   The ekg ordered 05/17/16 demonstrates NSR, no ST segment changes   Recent Labs: 07/22/2016: ALT 45; TSH 2.532 07/30/2016: BUN 19; Creatinine, Ser 0.78; Hemoglobin 10.5; Platelets 220; Potassium 4.3; Sodium 141   Lipid Panel No results found for:  CHOL, TRIG, HDL, CHOLHDL, VLDL, LDLCALC, LDLDIRECT   Other studies Reviewed: Additional studies/ records that were reviewed today with results demonstrating: Normal LV function.  Normal valvular function.    ASSESSMENT AND PLAN:   1. Did well with knee surgery in December. No ischemia workup done before knee surgery.  2. AFib: Paroxysmal atrial fibrillation about 10 years ago. AFib episodes documented in Dec 2017.  Tolerating anticoagulation at this time.  She asked about ablation.  She  has a brother having an ablation.  Minimal sx on meds at this time.  Will see how AFib does when she restarts exercise.  She is willing to continue Eliquis. 3. Hypertension: Blood pressure well controlled. Continue current medicines. 4. Hyperlipidemia: Continue current lipid-lowering therapy.   Current medicines are reviewed at length with the patient today.  The patient concerns regarding her medicines were addressed.  The following changes have been made:  No change  Labs/ tests ordered today include:  No orders of the defined types were placed in this encounter.   Recommend 150 minutes/week of aerobic exercise Low fat, low carb, high fiber diet recommended  Disposition:   FU based on monitor results   Signed, Larae Grooms, MD  08/28/2016 9:54 AM    [facility] [address], [place], [place]  [address] Phone: [phone]; Fax: [phone]

## 2016-08-28 ENCOUNTER — Encounter: Payer: Self-pay | Admitting: Interventional Cardiology

## 2016-08-28 ENCOUNTER — Ambulatory Visit (INDEPENDENT_AMBULATORY_CARE_PROVIDER_SITE_OTHER): Payer: Medicare PPO | Admitting: Interventional Cardiology

## 2016-08-28 VITALS — BP 110/66 | HR 57

## 2016-08-28 DIAGNOSIS — I480 Paroxysmal atrial fibrillation: Secondary | ICD-10-CM | POA: Diagnosis not present

## 2016-08-28 DIAGNOSIS — I10 Essential (primary) hypertension: Secondary | ICD-10-CM | POA: Diagnosis not present

## 2016-08-28 DIAGNOSIS — Z7901 Long term (current) use of anticoagulants: Secondary | ICD-10-CM | POA: Diagnosis not present

## 2016-08-28 NOTE — Patient Instructions (Signed)
**Note De-identified Shannon Aguilar Obfuscation** Medication Instructions:  Same-no changes  Labwork: None  Testing/Procedures: None  Follow-Up: Your physician wants you to follow-up in: 6 months. You will receive a reminder letter in the mail two months in advance. If you don't receive a letter, please call our office to schedule the follow-up appointment.      If you need a refill on your cardiac medications before your next appointment, please call your pharmacy.   

## 2016-11-19 ENCOUNTER — Encounter: Payer: Self-pay | Admitting: Interventional Cardiology

## 2016-12-16 ENCOUNTER — Other Ambulatory Visit: Payer: Self-pay | Admitting: Family Medicine

## 2016-12-16 DIAGNOSIS — Z1231 Encounter for screening mammogram for malignant neoplasm of breast: Secondary | ICD-10-CM

## 2016-12-17 ENCOUNTER — Ambulatory Visit
Admission: RE | Admit: 2016-12-17 | Discharge: 2016-12-17 | Disposition: A | Discharge status: Home / Self Care | Payer: Medicare PPO | Source: Ambulatory Visit | Attending: Family Medicine | Admitting: Family Medicine

## 2016-12-17 DIAGNOSIS — Z1231 Encounter for screening mammogram for malignant neoplasm of breast: Secondary | ICD-10-CM

## 2017-02-12 ENCOUNTER — Encounter: Payer: Self-pay | Admitting: Interventional Cardiology

## 2017-03-01 NOTE — Progress Notes (Signed)
Cardiology Office Note   Date:  03/02/2017   ID:  KAMEAH RAWL, DOB 25-Feb-1946, MRN [id number]  PCP:  Antony Contras, MD    No chief complaint on file. Paroxysmal AFib   Wt Readings from Last 3 Encounters:  03/02/17 221 lb (100.2 kg)  07/28/16 220 lb (99.8 kg)  07/22/16 219 lb (99.3 kg)       History of Present Illness: Shannon Aguilar is a 71 y.o. female  Who had TKR in 12/17.  She has had palpitations intermittently prior to this and had been diagnosed with PAF in 2006.    Her palpitations typically occured at night.  The worst episode was on 05/17/16.  It lasted 30 minutes.  HR up to 149 once after a cold, but it resolved on its own before she got to urgent care.    In 2006, she had a cardiac eval.  She had PAF at that time.  Echo and stress test were done at that time.  W/u was negative.  That occurred after a trip to [place].  Further eval showed PAF in 12/17.  She has been on anticoagulation since that time.   In December 2017, she had knee replacement.  SHe has a brother with AFib, who had an ablation.   Since the last visit, no problems.  She has been riding the bike.  Denies : Chest pain. Dizziness. Leg edema. Nitroglycerin use. Orthopnea. Palpitations. Paroxysmal nocturnal dyspnea. Shortness of breath. Syncope.    No bleeding issues.   BP has been normal at home.       Past Medical History:  Diagnosis Date  . Anxiety   . Arthritis 02-25-12   arthritis-neck, fingers, hips, knees  . Chronic kidney disease 02-25-12   kidney stones- litho's x2  . Depression   . Dysrhythmia 2006   hx. A. Fib, no recent problems -rate control-NSR  at present, 2 or 3 events 2017  . History of blood transfusion 1978 and 1981  . History of skin cancer   . Hypertension   . Pneumonia as child  . Pre-diabetes   . Rash    red area chest from heart monitor on chest and abdomen areas healing  . Sleep apnea 02-25-12   uses cpap since 2000 setting between 9 and 13    Past  Surgical History:  Procedure Laterality Date  . [place]   x2   . DILATION AND CURETTAGE OF UTERUS  02-25-12   abnormal vaginal bleeding- ? 5 yrs ago  . KNEE ARTHROSCOPY  03/03/2012   Procedure: ARTHROSCOPY KNEE;  Surgeon: Gearlean Alf, MD;  Location: [facility];  Service: Orthopedics;  Laterality: Right;  Right Knee Arthroscopy, medial meniscectomy and chrondroplasty  . LITHOTRIPSY  02-25-12   x2   . TONSILLECTOMY  02-25-12   child  . TOTAL KNEE ARTHROPLASTY Right 11/19/2015   Procedure: TOTAL KNEE ARTHROPLASTY;  Surgeon: Gaynelle Arabian, MD;  Location: [facility];  Service: Orthopedics;  Laterality: Right;  . TOTAL KNEE ARTHROPLASTY Left 07/28/2016   Procedure: LEFT TOTAL KNEE ARTHROPLASTY;  Surgeon: Gaynelle Arabian, MD;  Location: [facility];  Service: Orthopedics;  Laterality: Left;     Current Outpatient Prescriptions  Medication Sig Dispense Refill  . acetaminophen (TYLENOL) 500 MG tablet Take 1,000 mg by mouth every 6 (six) hours as needed for mild pain.    Marland Kitchen amLODipine (NORVASC) 2.5 MG tablet Take 2.5 mg by mouth daily.     Marland Kitchen apixaban (  ELIQUIS) 5 MG TABS tablet Take 1 tablet (5 mg total) by mouth 2 (two) times daily. 60 tablet 3  . citalopram (CELEXA) 20 MG tablet Take 20 mg by mouth every evening.     . metoprolol tartrate (LOPRESSOR) 25 MG tablet Take 1 tablet (25 mg total) by mouth 2 (two) times daily. 180 tablet 3  . Pitavastatin Calcium (LIVALO) 2 MG TABS Take 2 mg by mouth at bedtime.    Vladimir Faster Glycol-Propyl Glycol (SYSTANE OP) Apply 1 drop to eye 2 (two) times daily as needed (for dry eyes). Dry eyes    . triamterene-hydrochlorothiazide (MAXZIDE) 75-50 MG tablet Take 1 tablet by mouth daily.     No current facility-administered medications for this visit.     Allergies:   Milk-related compounds and Penicillins    Social History:  The patient  reports that she quit smoking about 26 years ago. She has never used smokeless tobacco. She reports that she drinks  alcohol. She reports that she does not use drugs.   Family History:  The patient's family history includes Arthritis in her mother; Diabetes in her father; Heart attack in her father and mother; Heart disease in her father; Hypercholesterolemia in her sister; Hypertension in her brother, brother, and mother; Hypothyroidism in her sister; Prostate cancer in her father.    ROS:  Please see the history of present illness.   Otherwise, review of systems are positive for knee recovering well- back to normal when exercising at the [facility].   All other systems are reviewed and negative.    PHYSICAL EXAM: VS:  BP 116/64   Pulse 61   Ht 5\' 5"  (1.651 m)   Wt 221 lb (100.2 kg)   SpO2 98%   BMI 36.78 kg/m  , BMI Body mass index is 36.78 kg/m. GEN: Well nourished, well developed, in no acute distress  HEENT: normal  Neck: no JVD, carotid bruits, or masses Cardiac: RRR; no murmurs, rubs, or gallops,no edema  Respiratory:  clear to auscultation bilaterally, normal work of breathing GI: soft, nontender, nondistended, + BS MS: no deformity or atrophy  Skin: warm and dry, no rash Neuro:  Strength and sensation are intact Psych: euthymic mood, full affect   EKG:   The ekg ordered today demonstrates NSR, no ST changes   Recent Labs: 07/22/2016: ALT 45; TSH 2.532 07/30/2016: BUN 19; Creatinine, Ser 0.78; Hemoglobin 10.5; Platelets 220; Potassium 4.3; Sodium 141   Lipid Panel No results found for: CHOL, TRIG, HDL, CHOLHDL, VLDL, LDLCALC, LDLDIRECT   Other studies Reviewed: Additional studies/ records that were reviewed today with results demonstrating: Dec 2017 showed Normal LV function. Normal valvular function.  Labs reviewed from 3/18.   ASSESSMENT AND PLAN:  1. AFib: Maintaining NSR.  COntinue Eliquis  For stroke prevention.   2. Anticoagulated: Currently, benefits of anticoagulation outweight the risk.  COntinue Eliquis for stroke prevention. 3. HTN: BP controlled.  Continue current blood  pressure lowering medicines. 4. Hyperlipidemia: Lipids from 3/18 reviewed and well controlled.    Current medicines are reviewed at length with the patient today.  The patient concerns regarding her medicines were addressed.  The following changes have been made:  No change  Labs/ tests ordered today include:  No orders of the defined types were placed in this encounter.   Recommend 150 minutes/week of aerobic exercise Low fat, low carb, high fiber diet recommended  Disposition:   FU in 1 year   Signed, Larae Grooms, MD  03/02/2017 1:49  PM    [facility] [address], [place], [place]  [address] Phone: [phone]; Fax: [phone]

## 2017-03-02 ENCOUNTER — Encounter: Payer: Self-pay | Admitting: Interventional Cardiology

## 2017-03-02 ENCOUNTER — Ambulatory Visit (INDEPENDENT_AMBULATORY_CARE_PROVIDER_SITE_OTHER): Payer: Medicare PPO | Admitting: Interventional Cardiology

## 2017-03-02 VITALS — BP 116/64 | HR 61 | Ht 65.0 in | Wt 221.0 lb

## 2017-03-02 DIAGNOSIS — I10 Essential (primary) hypertension: Secondary | ICD-10-CM

## 2017-03-02 DIAGNOSIS — Z7901 Long term (current) use of anticoagulants: Secondary | ICD-10-CM | POA: Diagnosis not present

## 2017-03-02 DIAGNOSIS — E782 Mixed hyperlipidemia: Secondary | ICD-10-CM | POA: Diagnosis not present

## 2017-03-02 DIAGNOSIS — I480 Paroxysmal atrial fibrillation: Secondary | ICD-10-CM | POA: Diagnosis not present

## 2017-03-02 NOTE — Patient Instructions (Signed)
Medication Instructions:  Your physician recommends that you continue on your current medications as directed. Please refer to the Current Medication list given to you today.   Labwork: None  Testing/Procedures: None  Follow-Up: Your physician wants you to follow-up in: 1 year with Dr. Varanasi. You will receive a reminder letter in the mail two months in advance. If you don't receive a letter, please call our office to schedule the follow-up appointment.   Any Other Special Instructions Will Be Listed Below (If Applicable).     If you need a refill on your cardiac medications before your next appointment, please call your pharmacy.   

## 2017-04-02 ENCOUNTER — Other Ambulatory Visit: Payer: Self-pay | Admitting: Physician Assistant

## 2017-04-02 ENCOUNTER — Other Ambulatory Visit: Payer: Self-pay | Admitting: Interventional Cardiology

## 2017-04-02 NOTE — Telephone Encounter (Signed)
Request received for Eliquis 5mg ; pt is 71 yrs old, wt-100.2kg, Crea-0.78 on 07/30/16, last seen by Dr. Irish Lack on 03/02/17. Will send to requested pharmacy.

## 2017-04-28 ENCOUNTER — Ambulatory Visit
Admission: RE | Admit: 2017-04-28 | Discharge: 2017-04-28 | Disposition: A | Discharge status: Home / Self Care | Payer: Medicare PPO | Source: Ambulatory Visit | Attending: Family Medicine | Admitting: Family Medicine

## 2017-04-28 ENCOUNTER — Other Ambulatory Visit: Payer: Self-pay | Admitting: Family Medicine

## 2017-04-28 DIAGNOSIS — J208 Acute bronchitis due to other specified organisms: Secondary | ICD-10-CM

## 2017-04-28 DIAGNOSIS — R059 Cough, unspecified: Secondary | ICD-10-CM

## 2017-04-28 DIAGNOSIS — R05 Cough: Secondary | ICD-10-CM

## 2017-05-18 ENCOUNTER — Other Ambulatory Visit: Payer: Self-pay | Admitting: Family Medicine

## 2017-05-18 ENCOUNTER — Ambulatory Visit
Admission: RE | Admit: 2017-05-18 | Discharge: 2017-05-18 | Disposition: A | Discharge status: Home / Self Care | Payer: Medicare PPO | Source: Ambulatory Visit | Attending: Family Medicine | Admitting: Family Medicine

## 2017-05-18 DIAGNOSIS — J181 Lobar pneumonia, unspecified organism: Principal | ICD-10-CM

## 2017-05-18 DIAGNOSIS — R05 Cough: Secondary | ICD-10-CM

## 2017-05-18 DIAGNOSIS — R059 Cough, unspecified: Secondary | ICD-10-CM

## 2017-05-18 DIAGNOSIS — J189 Pneumonia, unspecified organism: Secondary | ICD-10-CM

## 2017-06-25 ENCOUNTER — Encounter: Payer: Self-pay | Admitting: Emergency Medicine

## 2017-06-25 ENCOUNTER — Ambulatory Visit (INDEPENDENT_AMBULATORY_CARE_PROVIDER_SITE_OTHER): Payer: Medicare PPO | Admitting: Emergency Medicine

## 2017-06-25 DIAGNOSIS — R05 Cough: Secondary | ICD-10-CM

## 2017-06-25 DIAGNOSIS — G4733 Obstructive sleep apnea (adult) (pediatric): Secondary | ICD-10-CM | POA: Diagnosis not present

## 2017-06-25 DIAGNOSIS — R053 Chronic cough: Secondary | ICD-10-CM

## 2017-06-25 NOTE — Patient Instructions (Addendum)
Please start zantac 150mg  twice a day Please continue your fluticasone NS, 2 sprays each side once a day.  Stay on the medications for a month straight and keep track of your cough.  At that time we will try to determine whether these medications have been helpful in decreasing cough frequency.  You may decide to stop them one at a time to see if the cough flares again. Follow with Dr Lamonte Sakai in 2 months.  If cough persists at that time then we will discuss any further workup indicated including possible breathing tests, visualization of your upper airway. Speak with Dr. Kendra Opitz regarding your CPAP pressures, a possible auto titration CPAP device.

## 2017-06-25 NOTE — Assessment & Plan Note (Signed)
Some decreased energy during the day.  She is waking up at 4 AM, unclear whether this has anything to do with obstructive events or her device.  I encouraged her to get new supplies regularly.  She needs to follow-up with Dr. Kendra Opitz and discuss whether her pressures need to be altered.  She may benefit from changing to an AutoSet device if she is not already using one.

## 2017-06-25 NOTE — Progress Notes (Signed)
Subjective:    Patient ID: Shannon Aguilar, female    DOB: 17-Feb-1946, 71 y.o.   MRN: [id number]  HPI 71 year old obese woman, former smoker (20 pack years) with a history of paroxysmal atrial fibrillation, hypertension, CKD, obstructive sleep apnea on CPAP. She is referred for chronic cough. She had a URI in July, kept a dry cough. She had another URI in August from her husband. Developed a 'barking cough", rarely productive of clear. She was treated with doxycycline. Cough improved significantly, now a different quality and most bothersome in the evening. Not waking her up from sleep. In retrospect  Overall she feels a bit more run-down. She did a trial of dexilant x 2 weeks, thinks that is helped her. She doesn't really feel any reflux overtly. She is on flonase for for rhinitis and allergies, uses in the Fall.   She is compliant with CPAP. Hasn't had supplies recently, sees Dr Isidoro Donning.   Review of Systems  Constitutional: Negative for fever and unexpected weight change.  HENT: Negative for congestion, dental problem, ear pain, nosebleeds, postnasal drip, rhinorrhea, sinus pressure, sneezing, sore throat and trouble swallowing.   Eyes: Negative for redness and itching.  Respiratory: Positive for cough. Negative for chest tightness, shortness of breath and wheezing.   Cardiovascular: Negative for palpitations and leg swelling.  Gastrointestinal: Negative for nausea and vomiting.  Genitourinary: Negative for dysuria.  Musculoskeletal: Negative for joint swelling.  Skin: Negative for rash.  Neurological: Negative for headaches.  Hematological: Does not bruise/bleed easily.  Psychiatric/Behavioral: Negative for dysphoric mood. The patient is not nervous/anxious.    Past Medical History:  Diagnosis Date  . Anxiety   . Arthritis 02-25-12   arthritis-neck, fingers, hips, knees  . Chronic kidney disease 02-25-12   kidney stones- litho's x2  . Depression   . Dysrhythmia 2006   hx. A. Fib, no  recent problems -rate control-NSR  at present, 2 or 3 events 2017  . History of blood transfusion 1978 and 1981  . History of skin cancer   . Hypertension   . Pneumonia as child  . Pre-diabetes   . Rash    red area chest from heart monitor on chest and abdomen areas healing  . Sleep apnea 02-25-12   uses cpap since 2000 setting between 9 and 13     Family History  Problem Relation Age of Onset  . Hypertension Mother   . Arthritis Mother   . Heart attack Mother   . Heart disease Father   . Prostate cancer Father   . Diabetes Father   . Heart attack Father   . Hypertension Brother   . Hypertension Brother   . Hypothyroidism Sister   . Hypercholesterolemia Sister      Social History   Social History  . Marital status: Married    Spouse name: N/A  . Number of children: N/A  . Years of education: N/A   Occupational History  . Not on file.   Social History Main Topics  . Smoking status: Former Smoker    Packs/day: 1.00    Years: 20.00    Types: Cigarettes    Quit date: 02/25/1991  . Smokeless tobacco: Never Used  . Alcohol use Yes     Comment: rare- social  . Drug use: No  . Sexual activity: Yes   Other Topics Concern  . Not on file   Social History Narrative  . No narrative on file     Allergies  Allergen Reactions  .  Milk-Related Compounds Diarrhea  . Penicillins Hives and Rash    ++ got ancef in 2013++ Has patient had a PCN reaction causing immediate rash, facial/tongue/throat swelling, SOB or lightheadedness with hypotension: unk Has patient had a PCN reaction causing severe rash involving mucus membranes or skin necrosis: no Has patient had a PCN reaction that required hospitalization: no Has patient had a PCN reaction occurring within the last 10 years: no If all of the above answers are "NO", then may proceed with Cephalosporin use.     Outpatient Medications Prior to Visit  Medication Sig Dispense Refill  . acetaminophen (TYLENOL) 500 MG tablet  Take 1,000 mg by mouth every 6 (six) hours as needed for mild pain.    Marland Kitchen amLODipine (NORVASC) 2.5 MG tablet Take 2.5 mg by mouth daily.     Marland Kitchen apixaban (ELIQUIS) 5 MG TABS tablet Take 1 tablet (5 mg total) by mouth 2 (two) times daily. 60 tablet 3  . citalopram (CELEXA) 20 MG tablet Take 20 mg by mouth every evening.     . metoprolol tartrate (LOPRESSOR) 25 MG tablet TAKE ONE TABLET BY MOUTH TWICE DAILY 180 tablet 3  . Pitavastatin Calcium (LIVALO) 2 MG TABS Take 2 mg by mouth at bedtime.    Vladimir Faster Glycol-Propyl Glycol (SYSTANE OP) Apply 1 drop to eye 2 (two) times daily as needed (for dry eyes). Dry eyes    . triamterene-hydrochlorothiazide (MAXZIDE) 75-50 MG tablet Take 1 tablet by mouth daily.    Marland Kitchen ELIQUIS 5 MG TABS tablet TAKE ONE TABLET BY MOUTH TWICE DAILY 60 tablet 5   No facility-administered medications prior to visit.         Objective:   Physical Exam Vitals:   06/25/17 0918  BP: 130/82  Pulse: 60  SpO2: 96%  Weight: 228 lb (103.4 kg)  Height: 5\' 6"  (1.676 m)   Gen: Pleasant, overwt, in no distress,  normal affect  ENT: No lesions,  mouth clear,  oropharynx clear, no postnasal drip  Neck: No JVD, no TMG, no carotid bruits  Lungs: No use of accessory muscles, clear without rales or rhonchi  Cardiovascular: RRR, heart sounds normal, no murmur or gallops, no peripheral edema  Musculoskeletal: No deformities, no cyanosis or clubbing  Neuro: alert, non focal  Skin: Warm, no lesions or rashes      Assessment & Plan:  Chronic cough Based on history it sounds like they are probably contributions from allergic rhinitis and esophageal reflux.  She may have improved some when she was empirically on Dexilant.  The initiator's of this flare of coughing appeared to be upper respiratory infections in the summer, probably a right upper lobe pneumonia based on chest x-ray for which she was treated.  I believe it would be reasonable to treat the common sustainers - GERD,  rhiinitis.  If the cough continues then we could escalate the evaluation, check PFT, consider bronchoscopy and airway inspection  Please start zantac 150mg  twice a day Please continue your fluticasone NS, 2 sprays each side once a day.  Stay on the medications for a month straight and keep track of your cough.  At that time we will try to determine whether these medications have been helpful in decreasing cough frequency.  You may decide to stop them one at a time to see if the cough flares again. Follow with Dr Lamonte Sakai in 2 months.  If cough persists at that time then we will discuss any further workup indicated including possible breathing  tests, visualization of your upper airway.   Obstructive sleep apnea Some decreased energy during the day.  She is waking up at 4 AM, unclear whether this has anything to do with obstructive events or her device.  I encouraged her to get new supplies regularly.  She needs to follow-up with Dr. Kendra Opitz and discuss whether her pressures need to be altered.  She may benefit from changing to an AutoSet device if she is not already using one.  Baltazar Apo, MD, PhD 06/25/2017, 9:52 AM [facility] [phone] or if no answer [phone]

## 2017-06-25 NOTE — Assessment & Plan Note (Signed)
Based on history it sounds like they are probably contributions from allergic rhinitis and esophageal reflux.  She may have improved some when she was empirically on Dexilant.  The initiator's of this flare of coughing appeared to be upper respiratory infections in the summer, probably a right upper lobe pneumonia based on chest x-ray for which she was treated.  I believe it would be reasonable to treat the common sustainers - GERD, rhiinitis.  If the cough continues then we could escalate the evaluation, check PFT, consider bronchoscopy and airway inspection  Please start zantac 150mg  twice a day Please continue your fluticasone NS, 2 sprays each side once a day.  Stay on the medications for a month straight and keep track of your cough.  At that time we will try to determine whether these medications have been helpful in decreasing cough frequency.  You may decide to stop them one at a time to see if the cough flares again. Follow with Dr Lamonte Sakai in 2 months.  If cough persists at that time then we will discuss any further workup indicated including possible breathing tests, visualization of your upper airway.

## 2017-08-31 ENCOUNTER — Ambulatory Visit: Payer: Medicare HMO | Admitting: Emergency Medicine

## 2017-08-31 ENCOUNTER — Encounter: Payer: Self-pay | Admitting: Emergency Medicine

## 2017-08-31 DIAGNOSIS — J471 Bronchiectasis with (acute) exacerbation: Secondary | ICD-10-CM

## 2017-08-31 DIAGNOSIS — R053 Chronic cough: Secondary | ICD-10-CM

## 2017-08-31 DIAGNOSIS — R05 Cough: Secondary | ICD-10-CM | POA: Diagnosis not present

## 2017-08-31 NOTE — Patient Instructions (Addendum)
We will perform a CT scan of your chest to evaluate for possible bronchiectasis.  We will perform full pulmonary function testing at your next office visit.  Please continue your zantac twice a day for now. We will consider decreasing in the future.  Please keep flonase available to use 2 sprays each side as needed for congestion.  You could consider starting loratadine 10mg  daily for allergic drainage.  Follow with Dr Lamonte Sakai in 1 month

## 2017-08-31 NOTE — Assessment & Plan Note (Signed)
With contributions of esophageal reflux and allergic rhinitis.  She benefited from Zantac, use twice a day.  She does continue to have allergic rhinitis and had to stop her Flonase.  We talked about adding loratadine and using Flonase as needed.  We will perform pulmonary function testing given her history of tobacco.  Was some question of possible bronchiectasis on her prior chest x-ray at the right base.  We will perform a CT scan of the chest to better evaluate.

## 2017-08-31 NOTE — Progress Notes (Signed)
Subjective:    Patient ID: Shannon Aguilar, female    DOB: [birth year], 72 y.o.   MRN: [id number]  HPI 72 year old obese woman, former smoker (20 pack years) with a history of paroxysmal atrial fibrillation, hypertension, CKD, obstructive sleep apnea on CPAP. She is referred for chronic cough. She had a URI in July, kept a dry cough. She had another URI in August from her husband. Developed a 'barking cough", rarely productive of clear. She was treated with doxycycline. Cough improved significantly, now a different quality and most bothersome in the evening. Not waking her up from sleep. In retrospect  Overall she feels a bit more run-down. She did a trial of dexilant x 2 weeks, thinks that is helped her. She doesn't really feel any reflux overtly. She is on flonase for for rhinitis and allergies, uses in the Fall.   She is compliant with CPAP. Hasn't had supplies recently, sees Dr Isidoro Donning.   ROV 08/31/17 --this is a follow-up visit for patient with chronic cough.  She also has sleep apnea on CPAP.  At her last visit we continued fluticasone nasal spray, started Zantac. She stopped the flonase due to epistaxis. She has done well until she caught a recent URI.     Review of Systems  Constitutional: Negative for fever and unexpected weight change.  HENT: Negative for congestion, dental problem, ear pain, nosebleeds, postnasal drip, rhinorrhea, sinus pressure, sneezing, sore throat and trouble swallowing.   Eyes: Negative for redness and itching.  Respiratory: Positive for cough. Negative for chest tightness, shortness of breath and wheezing.   Cardiovascular: Negative for palpitations and leg swelling.  Gastrointestinal: Negative for nausea and vomiting.  Genitourinary: Negative for dysuria.  Musculoskeletal: Negative for joint swelling.  Skin: Negative for rash.  Neurological: Negative for headaches.  Hematological: Does not bruise/bleed easily.  Psychiatric/Behavioral: Negative for dysphoric  mood. The patient is not nervous/anxious.    Past Medical History:  Diagnosis Date  . Anxiety   . Arthritis 02-25-12   arthritis-neck, fingers, hips, knees  . Chronic kidney disease 02-25-12   kidney stones- litho's x2  . Depression   . Dysrhythmia 2006   hx. A. Fib, no recent problems -rate control-NSR  at present, 2 or 3 events 2017  . History of blood transfusion 1978 and 1981  . History of skin cancer   . Hypertension   . Pneumonia as child  . Pre-diabetes   . Rash    red area chest from heart monitor on chest and abdomen areas healing  . Sleep apnea 02-25-12   uses cpap since 2000 setting between 9 and 13     Family History  Problem Relation Age of Onset  . Hypertension Mother   . Arthritis Mother   . Heart attack Mother   . Heart disease Father   . Prostate cancer Father   . Diabetes Father   . Heart attack Father   . Hypertension Brother   . Hypertension Brother   . Hypothyroidism Sister   . Hypercholesterolemia Sister      Social History   Socioeconomic History  . Marital status: Married    Spouse name: Not on file  . Number of children: Not on file  . Years of education: Not on file  . Highest education level: Not on file  Social Needs  . Financial resource strain: Not on file  . Food insecurity - worry: Not on file  . Food insecurity - inability: Not on file  . Transportation  needs - medical: Not on file  . Transportation needs - non-medical: Not on file  Occupational History  . Not on file  Tobacco Use  . Smoking status: Former Smoker    Packs/day: 1.00    Years: 20.00    Pack years: 20.00    Types: Cigarettes    Last attempt to quit: 02/25/1991    Years since quitting: 26.5  . Smokeless tobacco: Never Used  Substance and Sexual Activity  . Alcohol use: Yes    Comment: rare- social  . Drug use: No  . Sexual activity: Yes  Other Topics Concern  . Not on file  Social History Narrative  . Not on file     Allergies  Allergen Reactions  .  Milk-Related Compounds Diarrhea  . Penicillins Hives and Rash    ++ got ancef in 2013++ Has patient had a PCN reaction causing immediate rash, facial/tongue/throat swelling, SOB or lightheadedness with hypotension: unk Has patient had a PCN reaction causing severe rash involving mucus membranes or skin necrosis: no Has patient had a PCN reaction that required hospitalization: no Has patient had a PCN reaction occurring within the last 10 years: no If all of the above answers are "NO", then may proceed with Cephalosporin use.     Outpatient Medications Prior to Visit  Medication Sig Dispense Refill  . acetaminophen (TYLENOL) 500 MG tablet Take 1,000 mg by mouth every 6 (six) hours as needed for mild pain.    Marland Kitchen amLODipine (NORVASC) 2.5 MG tablet Take 2.5 mg by mouth daily.     Marland Kitchen apixaban (ELIQUIS) 5 MG TABS tablet Take 1 tablet (5 mg total) by mouth 2 (two) times daily. 60 tablet 3  . citalopram (CELEXA) 20 MG tablet Take 20 mg by mouth every evening.     . metoprolol tartrate (LOPRESSOR) 25 MG tablet TAKE ONE TABLET BY MOUTH TWICE DAILY 180 tablet 3  . Pitavastatin Calcium (LIVALO) 2 MG TABS Take 2 mg by mouth at bedtime.    Vladimir Faster Glycol-Propyl Glycol (SYSTANE OP) Apply 1 drop to eye 2 (two) times daily as needed (for dry eyes). Dry eyes    . triamterene-hydrochlorothiazide (MAXZIDE) 75-50 MG tablet Take 1 tablet by mouth daily.    . fluticasone (FLONASE) 50 MCG/ACT nasal spray Place 2 sprays into both nostrils daily.     No facility-administered medications prior to visit.         Objective:   Physical Exam Vitals:   08/31/17 1437  BP: 122/64  Pulse: (!) 59  SpO2: 96%  Weight: 228 lb 12.8 oz (103.8 kg)  Height: 5' 5.5" (1.664 m)   Gen: Pleasant, overwt, in no distress,  normal affect  ENT: No lesions,  mouth clear,  oropharynx clear, no postnasal drip  Neck: No JVD, no TMG, no carotid bruits  Lungs: No use of accessory muscles, clear without rales or  rhonchi  Cardiovascular: RRR, heart sounds normal, no murmur or gallops, no peripheral edema  Musculoskeletal: No deformities, no cyanosis or clubbing  Neuro: alert, non focal  Skin: Warm, no lesions or rashes      Assessment & Plan:  Chronic cough With contributions of esophageal reflux and allergic rhinitis.  She benefited from Zantac, use twice a day.  She does continue to have allergic rhinitis and had to stop her Flonase.  We talked about adding loratadine and using Flonase as needed.  We will perform pulmonary function testing given her history of tobacco.  Was some question of  possible bronchiectasis on her prior chest x-ray at the right base.  We will perform a CT scan of the chest to better evaluate.  Baltazar Apo, MD, PhD 08/31/2017, 3:07 PM [facility] [phone] or if no answer [phone]

## 2017-09-04 ENCOUNTER — Ambulatory Visit (INDEPENDENT_AMBULATORY_CARE_PROVIDER_SITE_OTHER)
Admission: RE | Admit: 2017-09-04 | Discharge: 2017-09-04 | Disposition: A | Discharge status: Home / Self Care | Payer: Medicare HMO | Source: Ambulatory Visit | Attending: Emergency Medicine | Admitting: Emergency Medicine

## 2017-09-04 DIAGNOSIS — R911 Solitary pulmonary nodule: Secondary | ICD-10-CM | POA: Diagnosis not present

## 2017-09-04 DIAGNOSIS — J471 Bronchiectasis with (acute) exacerbation: Secondary | ICD-10-CM | POA: Diagnosis not present

## 2017-09-07 ENCOUNTER — Encounter: Payer: Self-pay | Admitting: Interventional Cardiology

## 2017-09-18 ENCOUNTER — Encounter: Payer: Self-pay | Admitting: Emergency Medicine

## 2017-09-29 ENCOUNTER — Other Ambulatory Visit: Payer: Self-pay | Admitting: Interventional Cardiology

## 2017-10-02 ENCOUNTER — Ambulatory Visit: Payer: Medicare HMO | Admitting: Emergency Medicine

## 2017-10-02 ENCOUNTER — Ambulatory Visit (INDEPENDENT_AMBULATORY_CARE_PROVIDER_SITE_OTHER): Payer: Medicare HMO | Admitting: Emergency Medicine

## 2017-10-02 DIAGNOSIS — J471 Bronchiectasis with (acute) exacerbation: Secondary | ICD-10-CM | POA: Diagnosis not present

## 2017-10-02 LAB — PULMONARY FUNCTION TEST
DL/VA % pred: 111 %
DL/VA: 5.48 ml/min/mmHg/L
DLCO cor % pred: 85 %
DLCO cor: 21.82 ml/min/mmHg
DLCO unc % pred: 76 %
DLCO unc: 19.59 ml/min/mmHg
FEF 25-75 Post: 1.94 L/sec
FEF 25-75 Pre: 1.54 L/sec
FEF2575-%Change-Post: 25 %
FEF2575-%Pred-Post: 104 %
FEF2575-%Pred-Pre: 82 %
FEV1-%Change-Post: 7 %
FEV1-%Pred-Post: 75 %
FEV1-%Pred-Pre: 70 %
FEV1-Post: 1.74 L
FEV1-Pre: 1.63 L
FEV1FVC-%Change-Post: -1 %
FEV1FVC-%Pred-Pre: 104 %
FEV6-%Change-Post: 9 %
FEV6-%Pred-Post: 76 %
FEV6-%Pred-Pre: 69 %
FEV6-Post: 2.22 L
FEV6-Pre: 2.02 L
FEV6FVC-%Change-Post: 0 %
FEV6FVC-%Pred-Post: 104 %
FEV6FVC-%Pred-Pre: 105 %
FVC-%Change-Post: 8 %
FVC-%Pred-Post: 73 %
FVC-%Pred-Pre: 67 %
FVC-Post: 2.23 L
FVC-Pre: 2.05 L
Post FEV1/FVC ratio: 78 %
Post FEV6/FVC ratio: 100 %
Pre FEV1/FVC ratio: 79 %
Pre FEV6/FVC Ratio: 100 %
RV % pred: 80 %
RV: 1.84 L
TLC % pred: 76 %
TLC: 3.97 L

## 2017-10-02 NOTE — Progress Notes (Signed)
PFT done today. 

## 2017-10-02 NOTE — Telephone Encounter (Signed)
Eliquis 5mg  refill request received, pt is 72 yrs old, wt-103.8kg, Crea-0.82 on 05/21/17 via labs received from PCP office, last seen by [place] on 03/02/17; will send in refill to requested pharmacy.

## 2017-10-08 DIAGNOSIS — M1612 Unilateral primary osteoarthritis, left hip: Secondary | ICD-10-CM | POA: Diagnosis not present

## 2017-10-08 DIAGNOSIS — M7062 Trochanteric bursitis, left hip: Secondary | ICD-10-CM | POA: Diagnosis not present

## 2017-10-09 DIAGNOSIS — Z85828 Personal history of other malignant neoplasm of skin: Secondary | ICD-10-CM | POA: Diagnosis not present

## 2017-10-09 DIAGNOSIS — C44722 Squamous cell carcinoma of skin of right lower limb, including hip: Secondary | ICD-10-CM | POA: Diagnosis not present

## 2017-10-22 ENCOUNTER — Encounter: Payer: Self-pay | Admitting: Emergency Medicine

## 2017-10-22 ENCOUNTER — Ambulatory Visit: Payer: Medicare HMO | Admitting: Emergency Medicine

## 2017-10-22 DIAGNOSIS — R911 Solitary pulmonary nodule: Secondary | ICD-10-CM | POA: Insufficient documentation

## 2017-10-22 DIAGNOSIS — R05 Cough: Secondary | ICD-10-CM

## 2017-10-22 DIAGNOSIS — R053 Chronic cough: Secondary | ICD-10-CM

## 2017-10-22 NOTE — Progress Notes (Signed)
Subjective:    Patient ID: Shannon Aguilar, female    DOB: November 03, 1945, 72 y.o.   MRN: [id number]  HPI 72 year old obese woman, former smoker (20 pack years) with a history of paroxysmal atrial fibrillation, hypertension, CKD, obstructive sleep apnea on CPAP. She is referred for chronic cough. She had a URI in July, kept a dry cough. She had another URI in August from her husband. Developed a 'barking cough", rarely productive of clear. She was treated with doxycycline. Cough improved significantly, now a different quality and most bothersome in the evening. Not waking her up from sleep. In retrospect  Overall she feels a bit more run-down. She did a trial of dexilant x 2 weeks, thinks that is helped her. She doesn't really feel any reflux overtly. She is on flonase for for rhinitis and allergies, uses in the Fall.   She is compliant with CPAP. Hasn't had supplies recently, sees Dr Isidoro Donning.   ROV 08/31/17 --this is a follow-up visit for patient with chronic cough.  She also has sleep apnea on CPAP.  At her last visit we continued fluticasone nasal spray, started Zantac. She stopped the flonase due to epistaxis. She has done well until she caught a recent URI.   ROV 10/22/17 --72 year old woman with a history of obstructive sleep apnea on CPAP.  We have been following her for chronic cough.  At her last visit we added loratadine.  She had trouble tolerating Flonase due to nose irritation.  We also performed pulmonary function testing on 10/02/17.  This shows restriction with some possible superimposed obstruction, no significant bronchodilator response, restricted lung volumes and a slightly decreased diffusion capacity that corrects to the normal range when adjusted for alveolar volume.  We also performed a CT scan of her chest to evaluate for possible bronchiectasis.  This was done on 09/04/17 and I have reviewed.  This shows good aeration and no evidence for bronchiectasis.  She did have a 5 mm right upper  lobe nodule. Her cough has been better lately. She has exertional dyspnea.     Review of Systems  Constitutional: Negative for fever and unexpected weight change.  HENT: Negative for congestion, dental problem, ear pain, nosebleeds, postnasal drip, rhinorrhea, sinus pressure, sneezing, sore throat and trouble swallowing.   Eyes: Negative for redness and itching.  Respiratory: Positive for cough. Negative for chest tightness, shortness of breath and wheezing.   Cardiovascular: Negative for palpitations and leg swelling.  Gastrointestinal: Negative for nausea and vomiting.  Genitourinary: Negative for dysuria.  Musculoskeletal: Negative for joint swelling.  Skin: Negative for rash.  Neurological: Negative for headaches.  Hematological: Does not bruise/bleed easily.  Psychiatric/Behavioral: Negative for dysphoric mood. The patient is not nervous/anxious.     Past Medical History:  Diagnosis Date  . Anxiety   . Arthritis 02-25-12   arthritis-neck, fingers, hips, knees  . Chronic kidney disease 02-25-12   kidney stones- litho's x2  . Depression   . Dysrhythmia 2006   hx. A. Fib, no recent problems -rate control-NSR  at present, 2 or 3 events 2017  . History of blood transfusion 1978 and 1981  . History of skin cancer   . Hypertension   . Pneumonia as child  . Pre-diabetes   . Rash    red area chest from heart monitor on chest and abdomen areas healing  . Sleep apnea 02-25-12   uses cpap since 2000 setting between 9 and 13     Family History  Problem Relation  Age of Onset  . Hypertension Mother   . Arthritis Mother   . Heart attack Mother   . Heart disease Father   . Prostate cancer Father   . Diabetes Father   . Heart attack Father   . Hypertension Brother   . Hypertension Brother   . Hypothyroidism Sister   . Hypercholesterolemia Sister      Social History   Socioeconomic History  . Marital status: Married    Spouse name: Not on file  . Number of children: Not on  file  . Years of education: Not on file  . Highest education level: Not on file  Social Needs  . Financial resource strain: Not on file  . Food insecurity - worry: Not on file  . Food insecurity - inability: Not on file  . Transportation needs - medical: Not on file  . Transportation needs - non-medical: Not on file  Occupational History  . Not on file  Tobacco Use  . Smoking status: Former Smoker    Packs/day: 1.00    Years: 20.00    Pack years: 20.00    Types: Cigarettes    Last attempt to quit: 02/25/1991    Years since quitting: 26.6  . Smokeless tobacco: Never Used  Substance and Sexual Activity  . Alcohol use: Yes    Comment: rare- social  . Drug use: No  . Sexual activity: Yes  Other Topics Concern  . Not on file  Social History Narrative  . Not on file     Allergies  Allergen Reactions  . Milk-Related Compounds Diarrhea  . Penicillins Hives and Rash    ++ got ancef in 2013++ Has patient had a PCN reaction causing immediate rash, facial/tongue/throat swelling, SOB or lightheadedness with hypotension: unk Has patient had a PCN reaction causing severe rash involving mucus membranes or skin necrosis: no Has patient had a PCN reaction that required hospitalization: no Has patient had a PCN reaction occurring within the last 10 years: no If all of the above answers are "NO", then may proceed with Cephalosporin use.     Outpatient Medications Prior to Visit  Medication Sig Dispense Refill  . acetaminophen (TYLENOL) 500 MG tablet Take 1,000 mg by mouth every 6 (six) hours as needed for mild pain.    Marland Kitchen amLODipine (NORVASC) 2.5 MG tablet Take 2.5 mg by mouth daily.     Marland Kitchen apixaban (ELIQUIS) 5 MG TABS tablet Take 1 tablet (5 mg total) by mouth 2 (two) times daily. 60 tablet 3  . citalopram (CELEXA) 20 MG tablet Take 20 mg by mouth every evening.     Marland Kitchen ELIQUIS 5 MG TABS tablet TAKE 1 TABLET BY MOUTH TWICE DAILY 60 tablet 5  . metoprolol tartrate (LOPRESSOR) 25 MG tablet  TAKE ONE TABLET BY MOUTH TWICE DAILY 180 tablet 3  . Pitavastatin Calcium (LIVALO) 2 MG TABS Take 2 mg by mouth at bedtime.    Vladimir Faster Glycol-Propyl Glycol (SYSTANE OP) Apply 1 drop to eye 2 (two) times daily as needed (for dry eyes). Dry eyes    . triamterene-hydrochlorothiazide (MAXZIDE) 75-50 MG tablet Take 1 tablet by mouth daily.     No facility-administered medications prior to visit.         Objective:   Physical Exam Vitals:   10/22/17 0918  BP: 124/62  Pulse: 76  SpO2: 97%  Weight: 232 lb (105.2 kg)  Height: 5' 5.5" (1.664 m)   Gen: Pleasant, overwt, in no distress,  normal  affect  ENT: No lesions,  mouth clear,  oropharynx clear, no postnasal drip  Neck: No JVD, no TMG, no carotid bruits  Lungs: No use of accessory muscles, clear without rales or rhonchi  Cardiovascular: RRR, heart sounds normal, no murmur or gallops, no peripheral edema  Musculoskeletal: No deformities, no cyanosis or clubbing  Neuro: alert, non focal  Skin: Warm, no lesions or rashes      Assessment & Plan:  Chronic cough Improved at this time.  Suspect some contribution of allergic rhinitis.  She did not start loratadine but is willing to do so since the spring months are approaching.  She was unable to tolerate fluticasone nasal spray.  Her pulmonary function testing shows mainly restriction but also possibly some hints of obstruction.  For this reason were going to try albuterol to see if she gets any benefit either with her cough or with exertional dyspnea.  Solitary pulmonary nodule 5 mm right upper lobe nodule in a patient with a history of tobacco use.  She is low to moderate risk, her last smoking was about 10 years ago.  We discussed the pros and cons of following and we have decided to do serial CT scans.  The next one will be in 12 months.  Baltazar Apo, MD, PhD 10/22/2017, 10:02 AM [facility] [phone] or if no answer [phone]

## 2017-10-22 NOTE — Patient Instructions (Addendum)
Please try using albuterol 2 puffs if needed for shortness of breath, coughing spells.  You may want to try pretreating exercise with this to see if it makes your exertion easier. Start loratadine (Claritin) 10 mg daily during the spring allergy months. We will plan to repeat your CT scan of the chest in January 2022 follow small 5 mm pulmonary nodule. Follow with Dr Lamonte Sakai in 12 months or sooner if you have any problems

## 2017-10-22 NOTE — Assessment & Plan Note (Signed)
Improved at this time.  Suspect some contribution of allergic rhinitis.  She did not start loratadine but is willing to do so since the spring months are approaching.  She was unable to tolerate fluticasone nasal spray.  Her pulmonary function testing shows mainly restriction but also possibly some hints of obstruction.  For this reason were going to try albuterol to see if she gets any benefit either with her cough or with exertional dyspnea.

## 2017-10-22 NOTE — Assessment & Plan Note (Signed)
5 mm right upper lobe nodule in a patient with a history of tobacco use.  She is low to moderate risk, her last smoking was about 10 years ago.  We discussed the pros and cons of following and we have decided to do serial CT scans.  The next one will be in 12 months.

## 2017-10-27 ENCOUNTER — Other Ambulatory Visit: Payer: Self-pay

## 2017-10-27 ENCOUNTER — Encounter: Payer: Self-pay | Admitting: Emergency Medicine

## 2017-10-27 MED ORDER — ALBUTEROL SULFATE HFA 108 (90 BASE) MCG/ACT IN AERS: 2.0000 | INHALATION_SPRAY | Freq: Four times a day (QID) | RESPIRATORY_TRACT | 2 refills | Status: DC | PRN

## 2017-11-09 ENCOUNTER — Telehealth: Payer: Self-pay | Admitting: Emergency Medicine

## 2017-11-09 ENCOUNTER — Encounter: Payer: Self-pay | Admitting: Emergency Medicine

## 2017-11-09 DIAGNOSIS — R918 Other nonspecific abnormal finding of lung field: Secondary | ICD-10-CM

## 2017-11-09 NOTE — Telephone Encounter (Signed)
Please read the following message from the pt sent as an email through [facility]:   A friend of mine who stopped smoking 10 years ago underwent a former smoker's lung screening that involved a cat scan first in which a small nodule was discovered. A follow-up pet scan was given that showed cancer. Although I was not worried at the time of my last visit, I certainly am preoccupied with and anxious about this nodule now. Would it be possible for me to have the pet scan now?     Please advise thanks!

## 2017-11-10 NOTE — Telephone Encounter (Signed)
Please let her know that this is a good question.  Her nodule is so small that unfortunately a PET scan will be very informative about it right now.  I think that if she has some concern and wants to be more aggressive and how we approach the nodule then we should repeat her CT scan in 6 months instead of 12 months.  That would mean that she would need a CT scan of the chest without contrast, super D cuts, in July 2019.  If she is okay with this then let us order the test for that time.

## 2017-12-16 NOTE — Telephone Encounter (Signed)
Please see email dated 11/09/17

## 2017-12-29 DIAGNOSIS — I10 Essential (primary) hypertension: Secondary | ICD-10-CM | POA: Diagnosis not present

## 2017-12-29 DIAGNOSIS — R69 Illness, unspecified: Secondary | ICD-10-CM | POA: Diagnosis not present

## 2017-12-29 DIAGNOSIS — G4730 Sleep apnea, unspecified: Secondary | ICD-10-CM | POA: Diagnosis not present

## 2017-12-29 DIAGNOSIS — R05 Cough: Secondary | ICD-10-CM | POA: Diagnosis not present

## 2017-12-29 DIAGNOSIS — R7303 Prediabetes: Secondary | ICD-10-CM | POA: Diagnosis not present

## 2017-12-29 DIAGNOSIS — Z0000 Encounter for general adult medical examination without abnormal findings: Secondary | ICD-10-CM | POA: Diagnosis not present

## 2017-12-29 DIAGNOSIS — E782 Mixed hyperlipidemia: Secondary | ICD-10-CM | POA: Diagnosis not present

## 2017-12-29 DIAGNOSIS — Z1211 Encounter for screening for malignant neoplasm of colon: Secondary | ICD-10-CM | POA: Diagnosis not present

## 2017-12-29 DIAGNOSIS — J45909 Unspecified asthma, uncomplicated: Secondary | ICD-10-CM | POA: Diagnosis not present

## 2017-12-29 DIAGNOSIS — N183 Chronic kidney disease, stage 3 (moderate): Secondary | ICD-10-CM | POA: Diagnosis not present

## 2017-12-29 DIAGNOSIS — I480 Paroxysmal atrial fibrillation: Secondary | ICD-10-CM | POA: Diagnosis not present

## 2018-01-14 ENCOUNTER — Encounter: Payer: Self-pay | Admitting: Interventional Cardiology

## 2018-01-19 ENCOUNTER — Other Ambulatory Visit: Payer: Self-pay | Admitting: Obstetrics and Gynecology

## 2018-01-19 DIAGNOSIS — Z1231 Encounter for screening mammogram for malignant neoplasm of breast: Secondary | ICD-10-CM

## 2018-02-09 ENCOUNTER — Ambulatory Visit
Admission: RE | Admit: 2018-02-09 | Discharge: 2018-02-09 | Disposition: A | Discharge status: Home / Self Care | Payer: Medicare HMO | Source: Ambulatory Visit | Attending: Obstetrics and Gynecology | Admitting: Obstetrics and Gynecology

## 2018-02-09 DIAGNOSIS — D1801 Hemangioma of skin and subcutaneous tissue: Secondary | ICD-10-CM | POA: Diagnosis not present

## 2018-02-09 DIAGNOSIS — L570 Actinic keratosis: Secondary | ICD-10-CM | POA: Diagnosis not present

## 2018-02-09 DIAGNOSIS — Z1231 Encounter for screening mammogram for malignant neoplasm of breast: Secondary | ICD-10-CM

## 2018-02-09 DIAGNOSIS — L905 Scar conditions and fibrosis of skin: Secondary | ICD-10-CM | POA: Diagnosis not present

## 2018-02-09 DIAGNOSIS — Z85828 Personal history of other malignant neoplasm of skin: Secondary | ICD-10-CM | POA: Diagnosis not present

## 2018-02-18 DIAGNOSIS — G4733 Obstructive sleep apnea (adult) (pediatric): Secondary | ICD-10-CM | POA: Diagnosis not present

## 2018-03-05 ENCOUNTER — Inpatient Hospital Stay: Admission: RE | Admit: 2018-03-05 | Payer: Medicare HMO | Source: Ambulatory Visit

## 2018-03-05 ENCOUNTER — Other Ambulatory Visit: Payer: Medicare HMO

## 2018-03-19 ENCOUNTER — Emergency Department (HOSPITAL_COMMUNITY)
Admission: EM | Admit: 2018-03-19 | Discharge: 2018-03-19 | Disposition: A | Discharge status: Home / Self Care | Payer: Medicare HMO | Attending: Emergency Medicine | Admitting: Emergency Medicine

## 2018-03-19 ENCOUNTER — Encounter (HOSPITAL_COMMUNITY): Payer: Self-pay | Admitting: Emergency Medicine

## 2018-03-19 ENCOUNTER — Other Ambulatory Visit: Payer: Self-pay

## 2018-03-19 DIAGNOSIS — R5383 Other fatigue: Secondary | ICD-10-CM | POA: Diagnosis not present

## 2018-03-19 DIAGNOSIS — Z7901 Long term (current) use of anticoagulants: Secondary | ICD-10-CM | POA: Diagnosis not present

## 2018-03-19 DIAGNOSIS — E785 Hyperlipidemia, unspecified: Secondary | ICD-10-CM | POA: Insufficient documentation

## 2018-03-19 DIAGNOSIS — R195 Other fecal abnormalities: Secondary | ICD-10-CM | POA: Insufficient documentation

## 2018-03-19 DIAGNOSIS — I480 Paroxysmal atrial fibrillation: Secondary | ICD-10-CM | POA: Insufficient documentation

## 2018-03-19 DIAGNOSIS — Z79899 Other long term (current) drug therapy: Secondary | ICD-10-CM | POA: Insufficient documentation

## 2018-03-19 DIAGNOSIS — Z87891 Personal history of nicotine dependence: Secondary | ICD-10-CM | POA: Insufficient documentation

## 2018-03-19 DIAGNOSIS — I10 Essential (primary) hypertension: Secondary | ICD-10-CM | POA: Insufficient documentation

## 2018-03-19 DIAGNOSIS — K6289 Other specified diseases of anus and rectum: Secondary | ICD-10-CM | POA: Diagnosis not present

## 2018-03-19 DIAGNOSIS — K625 Hemorrhage of anus and rectum: Secondary | ICD-10-CM | POA: Diagnosis present

## 2018-03-19 LAB — CBC
HEMATOCRIT: 42.2 % (ref 36.0–46.0)
HEMATOCRIT: 41.7 % (ref 36.0–46.0)
HEMOGLOBIN: 13.4 g/dL (ref 12.0–15.0)
HEMOGLOBIN: 13.2 g/dL (ref 12.0–15.0)
MCH: 30 pg (ref 26.0–34.0)
MCH: 29.7 pg (ref 26.0–34.0)
MCHC: 31.8 g/dL (ref 30.0–36.0)
MCHC: 31.7 g/dL (ref 30.0–36.0)
MCV: 94.4 fL (ref 78.0–100.0)
MCV: 93.7 fL (ref 78.0–100.0)
Platelets: 269 10*3/uL (ref 150–400)
Platelets: 244 10*3/uL (ref 150–400)
RBC: 4.47 MIL/uL (ref 3.87–5.11)
RBC: 4.45 MIL/uL (ref 3.87–5.11)
RDW: 12.6 % (ref 11.5–15.5)
RDW: 12.5 % (ref 11.5–15.5)
WBC: 10.5 10*3/uL (ref 4.0–10.5)
WBC: 9 10*3/uL (ref 4.0–10.5)

## 2018-03-19 LAB — COMPREHENSIVE METABOLIC PANEL
ALK PHOS: 65 U/L (ref 38–126)
ALT: 37 U/L (ref 0–44)
ANION GAP: 10 (ref 5–15)
AST: 27 U/L (ref 15–41)
Albumin: 4.1 g/dL (ref 3.5–5.0)
BUN: 19 mg/dL (ref 8–23)
CO2: 26 mmol/L (ref 22–32)
Calcium: 9.2 mg/dL (ref 8.9–10.3)
Chloride: 102 mmol/L (ref 98–111)
Creatinine, Ser: 1.01 mg/dL — ABNORMAL HIGH (ref 0.44–1.00)
GFR calc Af Amer: >60 mL/min (ref >60)
GFR calc non Af Amer: 54 mL/min — ABNORMAL LOW (ref >60)
GLUCOSE: 84 mg/dL (ref 70–99)
Potassium: 4 mmol/L (ref 3.5–5.1)
Sodium: 138 mmol/L (ref 135–145)
Total Bilirubin: 0.9 mg/dL (ref 0.3–1.2)
Total Protein: 7.1 g/dL (ref 6.5–8.1)

## 2018-03-19 LAB — TYPE AND SCREEN
ABO/RH(D): A NEG
Antibody Screen: NEGATIVE

## 2018-03-19 LAB — POC OCCULT BLOOD, ED: Fecal Occult Bld: NEGATIVE

## 2018-03-19 LAB — ABO/RH: ABO/RH(D): A NEG

## 2018-03-19 NOTE — ED Notes (Signed)
Pt unable to provide stool sample.

## 2018-03-19 NOTE — Discharge Instructions (Addendum)
Your stool should return back to normal color over the next 12 hours.  If your stool does not change color or if you begin to notice liquid blood or other concerning symptoms such as lightheadedness, dizziness, shortness of breath, return to the ER immediately.  Avoid eating beets or other red foods until your stool is normal.

## 2018-03-19 NOTE — ED Provider Notes (Signed)
[facility] EMERGENCY DEPARTMENT Provider Note   CSN: [id number] Arrival date & time: 03/19/18  1313     History   Chief Complaint Chief Complaint  Patient presents with  . Rectal Bleeding    HPI Shannon Aguilar is a 72 y.o. female.  HPI   72 year old very pleasant female with history of CKD, A. fib on Eliquis, here with possible rectal bleeding.  The patient states she was in her usual state of health until earlier today.  She states that she initially had a sensation that she needed to go to the restroom.  She has had a hemorrhoid that has been itching and bothering her, but states this has not acutely worsened.  She went to the restroom and states she had brown stool, but did have some red streaks on it.  She thought it was inflammation from hemorrhoids so she called her doctor, who said that she should come to the office tomorrow if symptoms did not resolve, but should go to the ER if she had persistent bleeding.  Since then, she has had an episode of grossly bloody stool.  She states the blood was maroon-colored diffusely, with no brown in it.  Of note, she did have beat chips yesterday, but states it was a small bag only and she did not have any blood yesterday.  Denies any lightheadedness.  No syncopal episodes.  No chest pain or shortness of breath.  She had a colonoscopy several years ago that showed no polyps or abnormalities.  No known history of diverticulosis.  She does not take NSAIDs regularly.  Past Medical History:  Diagnosis Date  . Anxiety   . Arthritis 02-25-12   arthritis-neck, fingers, hips, knees  . Chronic kidney disease 02-25-12   kidney stones- litho's x2  . Depression   . Dysrhythmia 2006   hx. A. Fib, no recent problems -rate control-NSR  at present, 2 or 3 events 2017  . History of blood transfusion 1978 and 1981  . History of skin cancer   . Hypertension   . Pneumonia as child  . Pre-diabetes   . Rash    red area chest from heart  monitor on chest and abdomen areas healing  . Sleep apnea 02-25-12   uses cpap since 2000 setting between 9 and 13    Patient Active Problem List   Diagnosis Date Noted  . Solitary pulmonary nodule 10/22/2017  . Chronic cough 06/25/2017  . Obstructive sleep apnea 06/25/2017  . PAF (paroxysmal atrial fibrillation) ([place]) 07/30/2016  . Current use of long term anticoagulation 07/07/2016  . HTN (hypertension) 07/07/2016  . Hyperlipidemia 07/07/2016  . Obesity 07/07/2016  . OA (osteoarthritis) of knee 11/19/2015  . Acute medial meniscal tear 03/03/2012    Past Surgical History:  Procedure Laterality Date  . [place]   x2   . DILATION AND CURETTAGE OF UTERUS  02-25-12   abnormal vaginal bleeding- ? 5 yrs ago  . KNEE ARTHROSCOPY  03/03/2012   Procedure: ARTHROSCOPY KNEE;  Surgeon: Gearlean Alf, MD;  Location: [facility];  Service: Orthopedics;  Laterality: Right;  Right Knee Arthroscopy, medial meniscectomy and chrondroplasty  . LITHOTRIPSY  02-25-12   x2   . TONSILLECTOMY  02-25-12   child  . TOTAL KNEE ARTHROPLASTY Right 11/19/2015   Procedure: TOTAL KNEE ARTHROPLASTY;  Surgeon: Gaynelle Arabian, MD;  Location: [facility];  Service: Orthopedics;  Laterality: Right;  . TOTAL KNEE ARTHROPLASTY Left 07/28/2016  Procedure: LEFT TOTAL KNEE ARTHROPLASTY;  Surgeon: Gaynelle Arabian, MD;  Location: [facility];  Service: Orthopedics;  Laterality: Left;     OB History   None      Home Medications    Prior to Admission medications   Medication Sig Start Date End Date Taking? Authorizing Provider  acetaminophen (TYLENOL) 500 MG tablet Take 1,000 mg by mouth every 6 (six) hours as needed for mild pain.    [provider]  albuterol (PROVENTIL HFA;VENTOLIN HFA) 108 (90 Base) MCG/ACT inhaler Inhale 2 puffs into the lungs every 6 (six) hours as needed for wheezing or shortness of breath. 10/27/17   Collene Gobble, MD  amLODipine (NORVASC) 2.5 MG tablet Take 2.5 mg by mouth daily.   05/27/16   [provider]  apixaban (ELIQUIS) 5 MG TABS tablet Take 1 tablet (5 mg total) by mouth 2 (two) times daily. 07/30/16   Perkins, Alexzandrew L, PA-C  citalopram (CELEXA) 20 MG tablet Take 20 mg by mouth every evening.     [provider]  ELIQUIS 5 MG TABS tablet TAKE 1 TABLET BY MOUTH TWICE DAILY 10/02/17   Jettie Booze, MD  metoprolol tartrate (LOPRESSOR) 25 MG tablet TAKE ONE TABLET BY MOUTH TWICE DAILY 04/02/17   Jettie Booze, MD  Pitavastatin Calcium (LIVALO) 2 MG TABS Take 2 mg by mouth at bedtime.    [provider]  Polyethyl Glycol-Propyl Glycol (SYSTANE OP) Apply 1 drop to eye 2 (two) times daily as needed (for dry eyes). Dry eyes    [provider]  triamterene-hydrochlorothiazide (MAXZIDE) 75-50 MG tablet Take 1 tablet by mouth daily.    [provider]    Family History Family History  Problem Relation Age of Onset  . Hypertension Mother   . Arthritis Mother   . Heart attack Mother   . Heart disease Father   . Prostate cancer Father   . Diabetes Father   . Heart attack Father   . Hypertension Brother   . Hypertension Brother   . Hypothyroidism Sister   . Hypercholesterolemia Sister     Social History Social History   Tobacco Use  . Smoking status: Former Smoker    Packs/day: 1.00    Years: 20.00    Pack years: 20.00    Types: Cigarettes    Last attempt to quit: 02/25/1991    Years since quitting: 27.0  . Smokeless tobacco: Never Used  Substance Use Topics  . Alcohol use: Yes    Comment: rare- social  . Drug use: No     Allergies   Milk-related compounds and Penicillins   Review of Systems Review of Systems  Constitutional: Positive for fatigue. Negative for chills and fever.  HENT: Negative for congestion and rhinorrhea.   Eyes: Negative for visual disturbance.  Respiratory: Negative for cough, shortness of breath and wheezing.   Cardiovascular: Negative for chest pain and leg  swelling.  Gastrointestinal: Positive for blood in stool and rectal pain. Negative for abdominal pain, diarrhea, nausea and vomiting.  Genitourinary: Negative for dysuria and flank pain.  Musculoskeletal: Negative for neck pain and neck stiffness.  Skin: Negative for rash and wound.  Allergic/Immunologic: Negative for immunocompromised state.  Neurological: Negative for syncope, weakness and headaches.  All other systems reviewed and are negative.    Physical Exam Updated Vital Signs BP 127/64   Pulse 63   Temp 99.1 F (37.3 C) (Oral)   Resp 16   Ht 5' 5.5" (1.664 m)  Wt 104.3 kg (230 lb)   SpO2 100%   BMI 37.69 kg/m   Physical Exam  Constitutional: She is oriented to person, place, and time. She appears well-developed and well-nourished. No distress.  HENT:  Head: Normocephalic and atraumatic.  Eyes: Conjunctivae are normal.  Neck: Neck supple.  Cardiovascular: Normal rate, regular rhythm and normal heart sounds. Exam reveals no friction rub.  No murmur heard. Pulmonary/Chest: Effort normal and breath sounds normal. No respiratory distress. She has no wheezes. She has no rales.  Abdominal: Soft. She exhibits no distension. There is no tenderness. There is no rebound and no guarding.  Genitourinary:  Genitourinary Comments: Maroon-colored stool in rectal vault.  Multiple, nonthrombosed external hemorrhoids.  Musculoskeletal: She exhibits no edema.  Neurological: She is alert and oriented to person, place, and time. She exhibits normal muscle tone.  Skin: Skin is warm. Capillary refill takes less than 2 seconds.  Psychiatric: She has a normal mood and affect.  Nursing note and vitals reviewed.    ED Treatments / Results  Labs (all labs ordered are listed, but only abnormal results are displayed) Labs Reviewed  COMPREHENSIVE METABOLIC PANEL - Abnormal; Notable for the following components:      Result Value   Creatinine, Ser 1.01 (*)    GFR calc non Af Amer 54 (*)      All other components within normal limits  CBC  CBC  POC OCCULT BLOOD, ED  TYPE AND SCREEN  ABO/RH    EKG None  Radiology No results found.  Procedures Procedures (including critical care time)  Medications Ordered in ED Medications - No data to display   Initial Impression / Assessment and Plan / ED Course  I have reviewed the triage vital signs and the nursing notes.  Pertinent labs & imaging results that were available during my care of the patient were reviewed by me and considered in my medical decision making (see chart for details).  Clinical Course as of Mar 20 1999  Fri Mar 19, 8448  [id number] 72 year old female with history of A. fib on Eliquis here with red stool.  Initially, concern for GI bleed.  However, on further questioning, the patient symptoms started after eating beat chips and Hemoccult negative x2 with significant red stool sample on each Hemoccult, raising possibility of possible pigmented stool secondary to food rather than blood.  She is otherwise completely asymptomatic and very well-appearing.  Will plan to repeat CBC to ensure absence of bleeding, and monitor.   [CI]    Clinical Course User Index [CI] Duffy Bruce, MD    Repeat Hemoccult negative again.  Hemoglobin remained stable at 13.2.  Patient remains completely asymptomatic and abdomen remains soft.  Given food known to discolor stool that correlates with the correct timeline of onset of her red stools, with no evidence of active bleeding on lab work or Hemoccult x2, I think it is reasonable to discharge her with good return precautions that if her stool does not change color or if she were to develop any other signs such as lightheadedness, shortness of breath, or other evidence of bleeding, she should return to the ER immediately.  She will follow-up with her PCP this week.  Final Clinical Impressions(s) / ED Diagnoses   Final diagnoses:  Discoloration of stool    ED Discharge Orders     None       Duffy Bruce, MD 03/19/18 2000

## 2018-03-19 NOTE — ED Triage Notes (Addendum)
Patient complains of rectal bleeding she first noticed today after breakfast. Patient reports bright red blood in toilet bowl after bowel movements. Patient alert, oriented, and in no apparent distress at this time. Patient anticoagulated with eliquis.

## 2018-03-19 NOTE — ED Notes (Signed)
Patient verbalizes understanding of discharge instructions. Opportunity for questioning and answers were provided. Armband removed by staff, pt discharged from ED.  

## 2018-03-30 DIAGNOSIS — R399 Unspecified symptoms and signs involving the genitourinary system: Secondary | ICD-10-CM | POA: Diagnosis not present

## 2018-03-30 DIAGNOSIS — R8290 Unspecified abnormal findings in urine: Secondary | ICD-10-CM | POA: Diagnosis not present

## 2018-04-04 ENCOUNTER — Other Ambulatory Visit: Payer: Self-pay | Admitting: Interventional Cardiology

## 2018-04-16 ENCOUNTER — Encounter: Payer: Self-pay | Admitting: Interventional Cardiology

## 2018-04-16 ENCOUNTER — Ambulatory Visit: Payer: Medicare HMO | Admitting: Interventional Cardiology

## 2018-04-16 VITALS — BP 106/62 | HR 93 | Ht 65.5 in | Wt 233.2 lb

## 2018-04-16 DIAGNOSIS — I480 Paroxysmal atrial fibrillation: Secondary | ICD-10-CM

## 2018-04-16 DIAGNOSIS — I10 Essential (primary) hypertension: Secondary | ICD-10-CM | POA: Diagnosis not present

## 2018-04-16 DIAGNOSIS — Z7901 Long term (current) use of anticoagulants: Secondary | ICD-10-CM

## 2018-04-16 DIAGNOSIS — E782 Mixed hyperlipidemia: Secondary | ICD-10-CM

## 2018-04-16 NOTE — Progress Notes (Signed)
Cardiology Office Note   Date:  04/16/2018   ID:  Shannon Aguilar, DOB March 20, 1946, MRN [id number]  PCP:  Shannon Contras, MD    No chief complaint on file.  PAF  Wt Readings from Last 3 Encounters:  04/16/18 233 lb 3.2 oz (105.8 kg)  03/19/18 230 lb (104.3 kg)  10/22/17 232 lb (105.2 kg)       History of Present Illness: Shannon Aguilar is a 72 y.o. female  Who hadTKR in 12/17. She has had palpitations intermittently prior to this and had been diagnosed with PAF in 2006.   Her palpitationstypically occuredat night. The worst episode was on 05/17/16. It lasted 30 minutes. HR up to 149 once after a cold, but it resolved on its own before she got to urgent care.   In 2006, she had a cardiac eval. She had PAF at that time. Echo and stress test were done at that time. W/u was negative. That occurred after a trip to [place].  Further eval showed PAF in 12/17. She has been on anticoagulation since that time. In December 2017, she had knee replacement.  SHe has a brother with AFib, who had an ablation.   Since the last visit, she went to the ER once after eating beet chips and her stool was red- but this was found not to be bleeding.  She has had very few palpitations.  Episodes last less than a minute.  They resolve spontaneously.   No bleeding problems.  Hbg was 13.4.  She tries to exercise using a stationary bike and walking.    She admits to some dietary indiscretions, but is now starting the program with the state for prediabetes.    Denies : Chest pain. Dizziness. Leg edema. Nitroglycerin use. Orthopnea. Paroxysmal nocturnal dyspnea. Shortness of breath. Syncope.     Past Medical History:  Diagnosis Date  . Anxiety   . Arthritis 02-25-12   arthritis-neck, fingers, hips, knees  . Chronic kidney disease 02-25-12   kidney stones- litho's x2  . Depression   . Dysrhythmia 2006   hx. A. Fib, no recent problems -rate control-NSR  at present, 2 or 3 events  2017  . History of blood transfusion 1978 and 1981  . History of skin cancer   . Hypertension   . Pneumonia as child  . Pre-diabetes   . Rash    red area chest from heart monitor on chest and abdomen areas healing  . Sleep apnea 02-25-12   uses cpap since 2000 setting between 9 and 13    Past Surgical History:  Procedure Laterality Date  . [place]   x2   . DILATION AND CURETTAGE OF UTERUS  02-25-12   abnormal vaginal bleeding- ? 5 yrs ago  . KNEE ARTHROSCOPY  03/03/2012   Procedure: ARTHROSCOPY KNEE;  Surgeon: Gearlean Alf, MD;  Location: [facility];  Service: Orthopedics;  Laterality: Right;  Right Knee Arthroscopy, medial meniscectomy and chrondroplasty  . LITHOTRIPSY  02-25-12   x2   . TONSILLECTOMY  02-25-12   child  . TOTAL KNEE ARTHROPLASTY Right 11/19/2015   Procedure: TOTAL KNEE ARTHROPLASTY;  Surgeon: Gaynelle Arabian, MD;  Location: [facility];  Service: Orthopedics;  Laterality: Right;  . TOTAL KNEE ARTHROPLASTY Left 07/28/2016   Procedure: LEFT TOTAL KNEE ARTHROPLASTY;  Surgeon: Gaynelle Arabian, MD;  Location: [facility];  Service: Orthopedics;  Laterality: Left;     Current Outpatient Medications  Medication Sig Dispense Refill  .  acetaminophen (TYLENOL) 500 MG tablet Take 1,000 mg by mouth every 6 (six) hours as needed for mild pain.    Marland Kitchen albuterol (PROVENTIL HFA;VENTOLIN HFA) 108 (90 Base) MCG/ACT inhaler Inhale 2 puffs into the lungs every 6 (six) hours as needed for wheezing or shortness of breath. 1 Inhaler 2  . amLODipine (NORVASC) 2.5 MG tablet Take 2.5 mg by mouth daily.     . citalopram (CELEXA) 20 MG tablet Take 20 mg by mouth every evening.     Marland Kitchen ELIQUIS 5 MG TABS tablet TAKE 1 TABLET BY MOUTH TWICE DAILY 60 tablet 5  . metoprolol tartrate (LOPRESSOR) 25 MG tablet TAKE ONE TABLET BY MOUTH TWICE DAILY 180 tablet 3  . Pitavastatin Calcium (LIVALO) 2 MG TABS Take 2 mg by mouth at bedtime.    Vladimir Faster Glycol-Propyl Glycol (SYSTANE OP) Apply 1 drop to eye  2 (two) times daily as needed (for dry eyes). Dry eyes    . triamterene-hydrochlorothiazide (MAXZIDE) 75-50 MG tablet Take 1 tablet by mouth daily.     No current facility-administered medications for this visit.     Allergies:   Milk-related compounds and Penicillins    Social History:  The patient  reports that she quit smoking about 27 years ago. Her smoking use included cigarettes. She has a 20.00 pack-year smoking history. She has never used smokeless tobacco. She reports that she drinks alcohol. She reports that she does not use drugs.   Family History:  The patient's family history includes Arthritis in her mother; Diabetes in her father; Heart attack in her father and mother; Heart disease in her father; Hypercholesterolemia in her sister; Hypertension in her brother, brother, and mother; Hypothyroidism in her sister; Prostate cancer in her father.    ROS:  Please see the history of present illness.   Otherwise, review of systems are positive for trying to increase activity.   All other systems are reviewed and negative.    PHYSICAL EXAM: VS:  BP 106/62   Pulse 93   Ht 5' 5.5" (1.664 m)   Wt 233 lb 3.2 oz (105.8 kg)   SpO2 93%   BMI 38.22 kg/m  , BMI Body mass index is 38.22 kg/m. GEN: Well nourished, well developed, in no acute distress  HEENT: normal  Neck: no JVD, carotid bruits, or masses Cardiac: RRR; no murmurs, rubs, or gallops,no edema  Respiratory:  clear to auscultation bilaterally, normal work of breathing GI: soft, nontender, nondistended, + BS MS: no deformity or atrophy  Skin: warm and dry, no rash Neuro:  Strength and sensation are intact Psych: euthymic mood, full affect   EKG:   The ekg ordered today demonstrates normal sinus rhythm, no ST changes   Recent Labs: 03/19/2018: ALT 37; BUN 19; Creatinine, Ser 1.01; Hemoglobin 13.2; Platelets 244; Potassium 4.0; Sodium 138   Lipid Panel No results found for: CHOL, TRIG, HDL, CHOLHDL, VLDL, LDLCALC,  LDLDIRECT   Other studies Reviewed: Additional studies/ records that were reviewed today with results demonstrating: labs reviewed- well conrolled   ASSESSMENT AND PLAN:  1. AFib: In NSR.  Eliquis for anticoagulation.   Very rare palpitations.  Would not consider antiarrhythmic drug for her degree of symptoms. 2. Anticoagulated: No bleeding issues.  Hemoglobin stable. 3. HTN: COntrolled  COntinue current meds. 132/62 by my check. 4. Hyperlipidemia: LDL 63.  COntinue pitavastatin.    Current medicines are reviewed at length with the patient today.  The patient concerns regarding her medicines were addressed.  The  following changes have been made:  No change  Labs/ tests ordered today include:  No orders of the defined types were placed in this encounter.   Recommend 150 minutes/week of aerobic exercise Low fat, low carb, high fiber diet recommended  Disposition:   FU in 1 year   Signed, Larae Grooms, MD  04/16/2018 2:50 PM    [facility] [address], [place], [place]  [address] Phone: [phone]; Fax: [phone]

## 2018-04-16 NOTE — Patient Instructions (Signed)

## 2018-05-27 ENCOUNTER — Other Ambulatory Visit: Payer: Self-pay | Admitting: Interventional Cardiology

## 2018-06-29 DIAGNOSIS — M25569 Pain in unspecified knee: Secondary | ICD-10-CM | POA: Diagnosis not present

## 2018-06-29 DIAGNOSIS — H6120 Impacted cerumen, unspecified ear: Secondary | ICD-10-CM | POA: Diagnosis not present

## 2018-06-29 DIAGNOSIS — E782 Mixed hyperlipidemia: Secondary | ICD-10-CM | POA: Diagnosis not present

## 2018-06-29 DIAGNOSIS — N183 Chronic kidney disease, stage 3 (moderate): Secondary | ICD-10-CM | POA: Diagnosis not present

## 2018-06-29 DIAGNOSIS — R7303 Prediabetes: Secondary | ICD-10-CM | POA: Diagnosis not present

## 2018-06-29 DIAGNOSIS — I480 Paroxysmal atrial fibrillation: Secondary | ICD-10-CM | POA: Diagnosis not present

## 2018-06-29 DIAGNOSIS — M67431 Ganglion, right wrist: Secondary | ICD-10-CM | POA: Diagnosis not present

## 2018-06-29 DIAGNOSIS — J45909 Unspecified asthma, uncomplicated: Secondary | ICD-10-CM | POA: Diagnosis not present

## 2018-06-29 DIAGNOSIS — R69 Illness, unspecified: Secondary | ICD-10-CM | POA: Diagnosis not present

## 2018-06-29 DIAGNOSIS — I10 Essential (primary) hypertension: Secondary | ICD-10-CM | POA: Diagnosis not present

## 2018-07-15 DIAGNOSIS — J208 Acute bronchitis due to other specified organisms: Secondary | ICD-10-CM | POA: Diagnosis not present

## 2018-08-11 ENCOUNTER — Telehealth: Payer: Self-pay | Admitting: Emergency Medicine

## 2018-08-11 DIAGNOSIS — R911 Solitary pulmonary nodule: Secondary | ICD-10-CM

## 2018-08-11 NOTE — Telephone Encounter (Signed)
Called patient on first number provided and was not able to leave a message due to not accepting voice calls. I called pstient on second number provided and left a message to give [place] a call back. Last OV from 09/2017 stated that patient was to have a follow up CT in 2022 with a 12 month return OV. There is a patient e-mail dated 318/19 where RB stated that the patient could have a CT scan done in 6 months putting it being done in July of 2019. This was never ordered or done. Once the patient returns our call we can see what all she is needing.   In the meantime RB would you like for the patient to have a CT follow up?

## 2018-08-12 NOTE — Telephone Encounter (Signed)
CT has been scheduled.  I have called pt & left her a vm to call me back for appt info.

## 2018-08-12 NOTE — Telephone Encounter (Signed)
The pending CT expires 02/16/2019. Can that CT not be used?

## 2018-08-12 NOTE — Telephone Encounter (Signed)
Called pt but unable to reach her. Unable to leave a message due to VM not being set up.will try to call back later.

## 2018-08-12 NOTE — Telephone Encounter (Signed)
I asked Erline Levine at [facility] to try to schedule off of the referral that is in there.  She gets a warning message & it says "invalid referral".  She said she will need a new order.

## 2018-08-12 NOTE — Telephone Encounter (Signed)
I see that she has a super D CT of the chest ordered, needs to be scheduled for January 2020 and then follow-up with me to review.

## 2018-08-12 NOTE — Telephone Encounter (Signed)
New ct order has been placed.

## 2018-08-12 NOTE — Telephone Encounter (Signed)
Pt is returning call. Cb [phone].

## 2018-08-12 NOTE — Telephone Encounter (Signed)
P[facility], can you help [place] out with this please. Thank you!

## 2018-08-12 NOTE — Telephone Encounter (Signed)
Pt had a CT in 08/2017.  An order was placed in 11/2017 for pt to have CT in 02/2018.  Insurance denied that request & pt was notified per order.  Dr Lamonte Sakai wants her to have a CT in 08/2018 and he wants to follow up with her after.  We will need a new order for super D CT for 08/2018 please.  There is no order on file for that.

## 2018-09-08 ENCOUNTER — Ambulatory Visit (INDEPENDENT_AMBULATORY_CARE_PROVIDER_SITE_OTHER)
Admission: RE | Admit: 2018-09-08 | Discharge: 2018-09-08 | Disposition: A | Discharge status: Home / Self Care | Payer: Medicare HMO | Source: Ambulatory Visit | Attending: Emergency Medicine | Admitting: Emergency Medicine

## 2018-09-08 DIAGNOSIS — R911 Solitary pulmonary nodule: Secondary | ICD-10-CM

## 2018-09-16 ENCOUNTER — Ambulatory Visit: Payer: Medicare HMO | Admitting: Emergency Medicine

## 2018-09-16 ENCOUNTER — Encounter: Payer: Self-pay | Admitting: Emergency Medicine

## 2018-09-16 VITALS — BP 116/64 | HR 65 | Ht 65.5 in | Wt 226.0 lb

## 2018-09-16 DIAGNOSIS — R911 Solitary pulmonary nodule: Secondary | ICD-10-CM

## 2018-09-16 DIAGNOSIS — G4733 Obstructive sleep apnea (adult) (pediatric): Secondary | ICD-10-CM

## 2018-09-16 DIAGNOSIS — R053 Chronic cough: Secondary | ICD-10-CM

## 2018-09-16 DIAGNOSIS — R05 Cough: Secondary | ICD-10-CM

## 2018-09-16 MED ORDER — ALBUTEROL SULFATE HFA 108 (90 BASE) MCG/ACT IN AERS: 2.0000 | INHALATION_SPRAY | Freq: Four times a day (QID) | RESPIRATORY_TRACT | 5 refills | Status: DC | PRN

## 2018-09-16 NOTE — Progress Notes (Signed)
Subjective:    Patient ID: Shannon Aguilar, female    DOB: [birth year], 73 y.o.   MRN: [id number]  HPI  ROV 10/22/17 --73 year old woman with a history of obstructive sleep apnea on CPAP.  We have been following her for chronic cough.  At her last visit we added loratadine.  She had trouble tolerating Flonase due to nose irritation.  We also performed pulmonary function testing on 10/02/17.  This shows restriction with some possible superimposed obstruction, no significant bronchodilator response, restricted lung volumes and a slightly decreased diffusion capacity that corrects to the normal range when adjusted for alveolar volume.  We also performed a CT scan of her chest to evaluate for possible bronchiectasis.  This was done on 09/04/17 and I have reviewed.  This shows good aeration and no evidence for bronchiectasis.  She did have a 5 mm right upper lobe nodule. Her cough has been better lately. She has exertional dyspnea.   ROV 09/16/2018 --follow-up visit for 73 year old woman with a history of tobacco use (20 pack years), OSA on CPAP, chronic cough in the setting of allergic rhinitis. PFT with mixed obstruction and restriction. She has a 5 mm right upper lobe nodule found on CT scan 09/04/2017 that was done to evaluate for possible bronchiectasis.  No bronchiectasis was seen. Her cough has been well controlled except for when she has certain exposures. She has albuterol, has used it rarely. Good compliance, has equipment for her CPAP. She does sometimes nap during the day. Sleeps through most nights. Sees Dr Isidoro Donning.    Review of Systems  Constitutional: Negative for fever and unexpected weight change.  HENT: Negative for congestion, dental problem, ear pain, nosebleeds, postnasal drip, rhinorrhea, sinus pressure, sneezing, sore throat and trouble swallowing.   Eyes: Negative for redness and itching.  Respiratory: Positive for cough. Negative for chest tightness, shortness of breath and wheezing.     Cardiovascular: Negative for palpitations and leg swelling.  Gastrointestinal: Negative for nausea and vomiting.  Genitourinary: Negative for dysuria.  Musculoskeletal: Negative for joint swelling.  Skin: Negative for rash.  Neurological: Negative for headaches.  Hematological: Does not bruise/bleed easily.  Psychiatric/Behavioral: Negative for dysphoric mood. The patient is not nervous/anxious.     Past Medical History:  Diagnosis Date  . Anxiety   . Arthritis 02-25-12   arthritis-neck, fingers, hips, knees  . Chronic kidney disease 02-25-12   kidney stones- litho's x2  . Depression   . Dysrhythmia 2006   hx. A. Fib, no recent problems -rate control-NSR  at present, 2 or 3 events 2017  . History of blood transfusion 1978 and 1981  . History of skin cancer   . Hypertension   . Pneumonia as child  . Pre-diabetes   . Rash    red area chest from heart monitor on chest and abdomen areas healing  . Sleep apnea 02-25-12   uses cpap since 2000 setting between 9 and 13     Family History  Problem Relation Age of Onset  . Hypertension Mother   . Arthritis Mother   . Heart attack Mother   . Heart disease Father   . Prostate cancer Father   . Diabetes Father   . Heart attack Father   . Hypertension Brother   . Hypertension Brother   . Hypothyroidism Sister   . Hypercholesterolemia Sister      Social History   Socioeconomic History  . Marital status: Married    Spouse name: Not on file  .  Number of children: Not on file  . Years of education: Not on file  . Highest education level: Not on file  Occupational History  . Not on file  Social Needs  . Financial resource strain: Not on file  . Food insecurity:    Worry: Not on file    Inability: Not on file  . Transportation needs:    Medical: Not on file    Non-medical: Not on file  Tobacco Use  . Smoking status: Former Smoker    Packs/day: 1.00    Years: 20.00    Pack years: 20.00    Types: Cigarettes    Last attempt  to quit: 02/25/1991    Years since quitting: 27.5  . Smokeless tobacco: Never Used  Substance and Sexual Activity  . Alcohol use: Yes    Comment: rare- social  . Drug use: No  . Sexual activity: Yes  Lifestyle  . Physical activity:    Days per week: Not on file    Minutes per session: Not on file  . Stress: Not on file  Relationships  . Social connections:    Talks on phone: Not on file    Gets together: Not on file    Attends religious service: Not on file    Active member of club or organization: Not on file    Attends meetings of clubs or organizations: Not on file    Relationship status: Not on file  . Intimate partner violence:    Fear of current or ex partner: Not on file    Emotionally abused: Not on file    Physically abused: Not on file    Forced sexual activity: Not on file  Other Topics Concern  . Not on file  Social History Narrative  . Not on file     Allergies  Allergen Reactions  . Milk-Related Compounds Diarrhea  . Penicillins Hives and Rash    ++ got ancef in 2013++ Has patient had a PCN reaction causing immediate rash, facial/tongue/throat swelling, SOB or lightheadedness with hypotension: unk Has patient had a PCN reaction causing severe rash involving mucus membranes or skin necrosis: no Has patient had a PCN reaction that required hospitalization: no Has patient had a PCN reaction occurring within the last 10 years: no If all of the above answers are "NO", then may proceed with Cephalosporin use.     Outpatient Medications Prior to Visit  Medication Sig Dispense Refill  . acetaminophen (TYLENOL) 500 MG tablet Take 1,000 mg by mouth every 6 (six) hours as needed for mild pain.    Marland Kitchen amLODipine (NORVASC) 2.5 MG tablet Take 2.5 mg by mouth daily.     . citalopram (CELEXA) 20 MG tablet Take 20 mg by mouth every evening.     Marland Kitchen ELIQUIS 5 MG TABS tablet TAKE 1 TABLET BY MOUTH TWICE DAILY 60 tablet 5  . metoprolol tartrate (LOPRESSOR) 25 MG tablet TAKE 1  TABLET BY MOUTH TWICE DAILY 180 tablet 3  . Pitavastatin Calcium (LIVALO) 2 MG TABS Take 2 mg by mouth at bedtime.    Vladimir Faster Glycol-Propyl Glycol (SYSTANE OP) Apply 1 drop to eye 2 (two) times daily as needed (for dry eyes). Dry eyes    . triamterene-hydrochlorothiazide (MAXZIDE) 75-50 MG tablet Take 1 tablet by mouth daily.    Marland Kitchen albuterol (PROVENTIL HFA;VENTOLIN HFA) 108 (90 Base) MCG/ACT inhaler Inhale 2 puffs into the lungs every 6 (six) hours as needed for wheezing or shortness of breath. 1 Inhaler  2   No facility-administered medications prior to visit.         Objective:   Physical Exam Vitals:   09/16/18 1338  BP: 116/64  Pulse: 65  SpO2: 100%  Weight: 226 lb (102.5 kg)  Height: 5' 5.5" (1.664 m)   Gen: Pleasant, overwt, in no distress,  normal affect  ENT: No lesions,  mouth clear,  oropharynx clear, no postnasal drip  Neck: No JVD, no stridor  Lungs: No use of accessory muscles, clear without rales or rhonchi  Cardiovascular: RRR, heart sounds normal, no murmur or gallops, no peripheral edema  Musculoskeletal: No deformities, no cyanosis or clubbing  Neuro: alert, non focal  Skin: Warm, no lesions or rashes      Assessment & Plan:  Solitary pulmonary nodule 5 mm pulmonary nodule stable for 1 year.  She needs another scan in January 2021, if stable then we will deem this nodule benign.  Chronic cough Improved.  Only happens intermittently especially when she has certain exposures and increased rhinitis.  She is tolerating being off on allergy regimen  Obstructive sleep apnea Doing well with CPAP.  She does have some daytime sleepiness.  I wonder whether she may ultimately need a repeat pressure titration at some point.  She follows with Dr. Kendra Opitz and will mention this to him.  Baltazar Apo, MD, PhD 09/16/2018, 1:56 PM [facility] [phone] or if no answer [phone]

## 2018-09-16 NOTE — Patient Instructions (Signed)
We will repeat your CT scan of the chest in January 2021 to ensure stability You could consider restarting your allergy medications, antihistamine if you have flaring of your congestion or coughing. Keep your albuterol available to use 2 puffs if needed for shortness of breath.  Please obtain a copy of your insurance formulary so we can determine which brand of albuterol is best covered.  We will order this for you Follow with Dr. Lamonte Sakai in 12 months or sooner if you have any problems.

## 2018-09-16 NOTE — Assessment & Plan Note (Signed)
Doing well with CPAP.  She does have some daytime sleepiness.  I wonder whether she may ultimately need a repeat pressure titration at some point.  She follows with Dr. Kendra Opitz and will mention this to him.

## 2018-09-16 NOTE — Assessment & Plan Note (Signed)
Improved.  Only happens intermittently especially when she has certain exposures and increased rhinitis.  She is tolerating being off on allergy regimen

## 2018-09-16 NOTE — Assessment & Plan Note (Signed)
5 mm pulmonary nodule stable for 1 year.  She needs another scan in January 2021, if stable then we will deem this nodule benign.

## 2018-09-29 DIAGNOSIS — G4733 Obstructive sleep apnea (adult) (pediatric): Secondary | ICD-10-CM | POA: Diagnosis not present

## 2018-10-01 ENCOUNTER — Other Ambulatory Visit: Payer: Self-pay | Admitting: Interventional Cardiology

## 2018-10-01 DIAGNOSIS — M5412 Radiculopathy, cervical region: Secondary | ICD-10-CM | POA: Diagnosis not present

## 2018-10-14 DIAGNOSIS — M179 Osteoarthritis of knee, unspecified: Secondary | ICD-10-CM | POA: Diagnosis not present

## 2018-10-14 DIAGNOSIS — G4733 Obstructive sleep apnea (adult) (pediatric): Secondary | ICD-10-CM | POA: Diagnosis not present

## 2018-10-15 DIAGNOSIS — M179 Osteoarthritis of knee, unspecified: Secondary | ICD-10-CM | POA: Diagnosis not present

## 2018-10-15 DIAGNOSIS — G4733 Obstructive sleep apnea (adult) (pediatric): Secondary | ICD-10-CM | POA: Diagnosis not present

## 2018-10-19 ENCOUNTER — Other Ambulatory Visit: Payer: Self-pay | Admitting: Interventional Cardiology

## 2018-10-25 DIAGNOSIS — D1801 Hemangioma of skin and subcutaneous tissue: Secondary | ICD-10-CM | POA: Diagnosis not present

## 2018-10-25 DIAGNOSIS — D2261 Melanocytic nevi of right upper limb, including shoulder: Secondary | ICD-10-CM | POA: Diagnosis not present

## 2018-10-25 DIAGNOSIS — L820 Inflamed seborrheic keratosis: Secondary | ICD-10-CM | POA: Diagnosis not present

## 2018-10-25 DIAGNOSIS — L438 Other lichen planus: Secondary | ICD-10-CM | POA: Diagnosis not present

## 2018-10-25 DIAGNOSIS — L814 Other melanin hyperpigmentation: Secondary | ICD-10-CM | POA: Diagnosis not present

## 2018-10-25 DIAGNOSIS — Z85828 Personal history of other malignant neoplasm of skin: Secondary | ICD-10-CM | POA: Diagnosis not present

## 2018-10-25 DIAGNOSIS — D2262 Melanocytic nevi of left upper limb, including shoulder: Secondary | ICD-10-CM | POA: Diagnosis not present

## 2018-10-25 DIAGNOSIS — D225 Melanocytic nevi of trunk: Secondary | ICD-10-CM | POA: Diagnosis not present

## 2018-10-25 DIAGNOSIS — L821 Other seborrheic keratosis: Secondary | ICD-10-CM | POA: Diagnosis not present

## 2018-10-26 DIAGNOSIS — M5030 Other cervical disc degeneration, unspecified cervical region: Secondary | ICD-10-CM | POA: Diagnosis not present

## 2018-11-12 DIAGNOSIS — M179 Osteoarthritis of knee, unspecified: Secondary | ICD-10-CM | POA: Diagnosis not present

## 2018-11-12 DIAGNOSIS — G4733 Obstructive sleep apnea (adult) (pediatric): Secondary | ICD-10-CM | POA: Diagnosis not present

## 2018-11-17 DIAGNOSIS — M542 Cervicalgia: Secondary | ICD-10-CM | POA: Diagnosis not present

## 2018-12-13 DIAGNOSIS — M179 Osteoarthritis of knee, unspecified: Secondary | ICD-10-CM | POA: Diagnosis not present

## 2018-12-13 DIAGNOSIS — G4733 Obstructive sleep apnea (adult) (pediatric): Secondary | ICD-10-CM | POA: Diagnosis not present

## 2019-01-04 DIAGNOSIS — E1169 Type 2 diabetes mellitus with other specified complication: Secondary | ICD-10-CM | POA: Diagnosis not present

## 2019-01-04 DIAGNOSIS — R300 Dysuria: Secondary | ICD-10-CM | POA: Diagnosis not present

## 2019-01-04 DIAGNOSIS — I480 Paroxysmal atrial fibrillation: Secondary | ICD-10-CM | POA: Diagnosis not present

## 2019-01-04 DIAGNOSIS — Z0000 Encounter for general adult medical examination without abnormal findings: Secondary | ICD-10-CM | POA: Diagnosis not present

## 2019-01-04 DIAGNOSIS — N183 Chronic kidney disease, stage 3 (moderate): Secondary | ICD-10-CM | POA: Diagnosis not present

## 2019-01-04 DIAGNOSIS — R69 Illness, unspecified: Secondary | ICD-10-CM | POA: Diagnosis not present

## 2019-01-04 DIAGNOSIS — E782 Mixed hyperlipidemia: Secondary | ICD-10-CM | POA: Diagnosis not present

## 2019-01-04 DIAGNOSIS — I10 Essential (primary) hypertension: Secondary | ICD-10-CM | POA: Diagnosis not present

## 2019-01-04 DIAGNOSIS — J45909 Unspecified asthma, uncomplicated: Secondary | ICD-10-CM | POA: Diagnosis not present

## 2019-01-04 DIAGNOSIS — J309 Allergic rhinitis, unspecified: Secondary | ICD-10-CM | POA: Diagnosis not present

## 2019-01-10 DIAGNOSIS — Z1211 Encounter for screening for malignant neoplasm of colon: Secondary | ICD-10-CM | POA: Diagnosis not present

## 2019-01-11 DIAGNOSIS — G4733 Obstructive sleep apnea (adult) (pediatric): Secondary | ICD-10-CM | POA: Diagnosis not present

## 2019-01-12 DIAGNOSIS — M179 Osteoarthritis of knee, unspecified: Secondary | ICD-10-CM | POA: Diagnosis not present

## 2019-01-12 DIAGNOSIS — G4733 Obstructive sleep apnea (adult) (pediatric): Secondary | ICD-10-CM | POA: Diagnosis not present

## 2019-01-22 DIAGNOSIS — M25532 Pain in left wrist: Secondary | ICD-10-CM | POA: Diagnosis not present

## 2019-01-22 DIAGNOSIS — M25571 Pain in right ankle and joints of right foot: Secondary | ICD-10-CM | POA: Diagnosis not present

## 2019-02-12 DIAGNOSIS — G4733 Obstructive sleep apnea (adult) (pediatric): Secondary | ICD-10-CM | POA: Diagnosis not present

## 2019-02-12 DIAGNOSIS — M179 Osteoarthritis of knee, unspecified: Secondary | ICD-10-CM | POA: Diagnosis not present

## 2019-03-03 ENCOUNTER — Other Ambulatory Visit: Payer: Self-pay | Admitting: Interventional Cardiology

## 2019-03-03 MED ORDER — METOPROLOL TARTRATE 25 MG PO TABS: 25.0000 mg | ORAL_TABLET | Freq: Two times a day (BID) | ORAL | 0 refills | Status: DC

## 2019-03-14 ENCOUNTER — Other Ambulatory Visit: Payer: Self-pay | Admitting: Obstetrics and Gynecology

## 2019-03-14 DIAGNOSIS — R69 Illness, unspecified: Secondary | ICD-10-CM | POA: Diagnosis not present

## 2019-03-14 DIAGNOSIS — I480 Paroxysmal atrial fibrillation: Secondary | ICD-10-CM | POA: Diagnosis not present

## 2019-03-14 DIAGNOSIS — E782 Mixed hyperlipidemia: Secondary | ICD-10-CM | POA: Diagnosis not present

## 2019-03-14 DIAGNOSIS — M179 Osteoarthritis of knee, unspecified: Secondary | ICD-10-CM | POA: Diagnosis not present

## 2019-03-14 DIAGNOSIS — E1169 Type 2 diabetes mellitus with other specified complication: Secondary | ICD-10-CM | POA: Diagnosis not present

## 2019-03-14 DIAGNOSIS — I10 Essential (primary) hypertension: Secondary | ICD-10-CM | POA: Diagnosis not present

## 2019-03-14 DIAGNOSIS — Z1231 Encounter for screening mammogram for malignant neoplasm of breast: Secondary | ICD-10-CM

## 2019-03-14 DIAGNOSIS — G4733 Obstructive sleep apnea (adult) (pediatric): Secondary | ICD-10-CM | POA: Diagnosis not present

## 2019-03-14 DIAGNOSIS — N183 Chronic kidney disease, stage 3 (moderate): Secondary | ICD-10-CM | POA: Diagnosis not present

## 2019-04-13 DIAGNOSIS — E1169 Type 2 diabetes mellitus with other specified complication: Secondary | ICD-10-CM | POA: Diagnosis not present

## 2019-04-13 DIAGNOSIS — E782 Mixed hyperlipidemia: Secondary | ICD-10-CM | POA: Diagnosis not present

## 2019-04-13 DIAGNOSIS — N183 Chronic kidney disease, stage 3 (moderate): Secondary | ICD-10-CM | POA: Diagnosis not present

## 2019-04-13 DIAGNOSIS — R69 Illness, unspecified: Secondary | ICD-10-CM | POA: Diagnosis not present

## 2019-04-13 DIAGNOSIS — I10 Essential (primary) hypertension: Secondary | ICD-10-CM | POA: Diagnosis not present

## 2019-04-13 DIAGNOSIS — I480 Paroxysmal atrial fibrillation: Secondary | ICD-10-CM | POA: Diagnosis not present

## 2019-04-14 DIAGNOSIS — H04123 Dry eye syndrome of bilateral lacrimal glands: Secondary | ICD-10-CM | POA: Diagnosis not present

## 2019-04-14 DIAGNOSIS — M179 Osteoarthritis of knee, unspecified: Secondary | ICD-10-CM | POA: Diagnosis not present

## 2019-04-14 DIAGNOSIS — H5213 Myopia, bilateral: Secondary | ICD-10-CM | POA: Diagnosis not present

## 2019-04-14 DIAGNOSIS — Z961 Presence of intraocular lens: Secondary | ICD-10-CM | POA: Diagnosis not present

## 2019-04-14 DIAGNOSIS — G4733 Obstructive sleep apnea (adult) (pediatric): Secondary | ICD-10-CM | POA: Diagnosis not present

## 2019-04-25 NOTE — Progress Notes (Signed)
Cardiology Office Note   Date:  04/26/2019   ID:  Shannon Aguilar, DOB [birth year], MRN [id number]  PCP:  Antony Contras, MD    No chief complaint on file.    Wt Readings from Last 3 Encounters:  04/26/19 229 lb (103.9 kg)  09/16/18 226 lb (102.5 kg)  04/16/18 233 lb 3.2 oz (105.8 kg)       History of Present Illness: Shannon Aguilar is a 73 y.o. female   Who hadTKR in 12/17. She has had palpitations intermittently prior to this and had been diagnosed with PAF in 2006.   Her palpitationstypically occuredat night. The worst episode was on 05/17/16. It lasted 30 minutes. HR up to 149 once after a cold, but it resolved on its own before she got to urgent care.   In 2006, she had a cardiac eval. She had PAF at that time. Echo and stress test were done at that time. W/u was negative. That occurred after a trip to [place].  Further eval showed PAF in 12/17. She has been on anticoagulation since that time. In December 2017, she had knee replacement.  SHe has a brother with AFib, who had an ablation.  In 2019, She went to the ER once after eating beet chips and her stool was red- but this was found not to be bleeding.   Since the last visit, she had some DOE.  Improved  with albuterol with loratadine, but not completely gone.,  Had coronary calcium noted on prior CT.  Denies : Chest pain. Dizziness. Leg edema. Nitroglycerin use. Orthopnea.  Paroxysmal nocturnal dyspnea. Shortness of breath. Syncope.   Very rare palpitations that resolve sponataneously.  Lasts just seconds.  She is walking fairly regularly, 30-50 minutes/day, 5 days a week.  She feels better with walking.      Past Medical History:  Diagnosis Date  . Anxiety   . Arthritis 02-25-12   arthritis-neck, fingers, hips, knees  . Chronic kidney disease 02-25-12   kidney stones- litho's x2  . Depression   . Dysrhythmia 2006   hx. A. Fib, no recent problems -rate control-NSR  at present, 2 or 3  events 2017  . History of blood transfusion 1978 and 1981  . History of skin cancer   . Hypertension   . Pneumonia as child  . Pre-diabetes   . Rash    red area chest from heart monitor on chest and abdomen areas healing  . Sleep apnea 02-25-12   uses cpap since 2000 setting between 9 and 13    Past Surgical History:  Procedure Laterality Date  . [place]   x2   . DILATION AND CURETTAGE OF UTERUS  02-25-12   abnormal vaginal bleeding- ? 5 yrs ago  . KNEE ARTHROSCOPY  03/03/2012   Procedure: ARTHROSCOPY KNEE;  Surgeon: Gearlean Alf, MD;  Location: [facility];  Service: Orthopedics;  Laterality: Right;  Right Knee Arthroscopy, medial meniscectomy and chrondroplasty  . LITHOTRIPSY  02-25-12   x2   . TONSILLECTOMY  02-25-12   child  . TOTAL KNEE ARTHROPLASTY Right 11/19/2015   Procedure: TOTAL KNEE ARTHROPLASTY;  Surgeon: Gaynelle Arabian, MD;  Location: [facility];  Service: Orthopedics;  Laterality: Right;  . TOTAL KNEE ARTHROPLASTY Left 07/28/2016   Procedure: LEFT TOTAL KNEE ARTHROPLASTY;  Surgeon: Gaynelle Arabian, MD;  Location: [facility];  Service: Orthopedics;  Laterality: Left;     Current Outpatient Medications  Medication Sig Dispense Refill  .  acetaminophen (TYLENOL) 500 MG tablet Take 1,000 mg by mouth every 6 (six) hours as needed for mild pain.    Marland Kitchen albuterol (PROVENTIL HFA;VENTOLIN HFA) 108 (90 Base) MCG/ACT inhaler Inhale 2 puffs into the lungs every 6 (six) hours as needed for wheezing or shortness of breath. 1 Inhaler 5  . amLODipine (NORVASC) 2.5 MG tablet Take 2.5 mg by mouth daily.     . citalopram (CELEXA) 20 MG tablet Take 20 mg by mouth every evening.     Marland Kitchen ELIQUIS 5 MG TABS tablet TAKE 1 TABLET BY MOUTH TWICE DAILY 60 tablet 5  . lovastatin (MEVACOR) 10 MG tablet Take 10 mg by mouth once a week.     . metoprolol tartrate (LOPRESSOR) 25 MG tablet Take 1 tablet (25 mg total) by mouth 2 (two) times daily. Please keep upcoming appt in September for future  refills. Thank you 180 tablet 0  . Polyethyl Glycol-Propyl Glycol (SYSTANE OP) Apply 1 drop to eye 2 (two) times daily as needed (for dry eyes). Dry eyes    . triamterene-hydrochlorothiazide (MAXZIDE) 75-50 MG tablet Take 1 tablet by mouth daily.     No current facility-administered medications for this visit.     Allergies:   Milk-related compounds and Penicillins    Social History:  The patient  reports that she quit smoking about 28 years ago. Her smoking use included cigarettes. She has a 20.00 pack-year smoking history. She has never used smokeless tobacco. She reports current alcohol use. She reports that she does not use drugs.   Family History:  The patient's family history includes Arthritis in her mother; Diabetes in her father; Heart attack in her father and mother; Heart disease in her father; Hypercholesterolemia in her sister; Hypertension in her brother, brother, and mother; Hypothyroidism in her sister; Prostate cancer in her father.    ROS:  Please see the history of present illness.   Otherwise, review of systems are positive for hip pain with walking.   All other systems are reviewed and negative.    PHYSICAL EXAM: VS:  BP 132/70   Pulse (!) 54   Ht 5' 5.5" (1.664 m)   Wt 229 lb (103.9 kg)   SpO2 97%   BMI 37.53 kg/m  , BMI Body mass index is 37.53 kg/m. GEN: Well nourished, well developed, in no acute distress  HEENT: normal  Neck: no JVD, carotid bruits, or masses Cardiac: RRR; no murmurs, rubs, or gallops,no edema  Respiratory:  clear to auscultation bilaterally, normal work of breathing GI: soft, nontender, nondistended, + BS MS: no deformity or atrophy  Skin: warm and dry, no rash Neuro:  Strength and sensation are intact Psych: euthymic mood, full affect   EKG:   The ekg ordered today demonstrates *sinus bradycardia, no ST changes   Recent Labs: No results found for requested labs within last 8760 hours.   Lipid Panel No results found for: CHOL,  TRIG, HDL, CHOLHDL, VLDL, LDLCALC, LDLDIRECT   Other studies Reviewed: Additional studies/ records that were reviewed today with results demonstrating: TG 241; LDL 89.   ASSESSMENT AND PLAN:  1. AFib: Maintaining NSR.  No bleeding problems.  2. Coronary calcification/aortic atherosclerosis: Given SHOB with walking up the stoars, will plan on coronary CTA. 3. Anticoagulated: Stroke preventon in the setting of AFib.  4. HTN: The current medical regimen is effective;  continue present plan and medications. 5. Hyperlipidemia: Livalo was too expensive.  She tried once a week lovastatin and numbers increased.  Based on CT scan results, will judge urgency of lipid lowering therapy.   Current medicines are reviewed at length with the patient today.  The patient concerns regarding her medicines were addressed.  The following changes have been made:  No change  Labs/ tests ordered today include:  No orders of the defined types were placed in this encounter.   Recommend 150 minutes/week of aerobic exercise Low fat, low carb, high fiber diet recommended  Disposition:   FU in 1 year or sooner based on the CT scan result   Signed, Larae Grooms, MD  04/26/2019 10:29 AM    [facility] [address], [place], [place]  [address] Phone: [phone]; Fax: [phone]

## 2019-04-26 ENCOUNTER — Encounter: Payer: Self-pay | Admitting: Interventional Cardiology

## 2019-04-26 ENCOUNTER — Ambulatory Visit (INDEPENDENT_AMBULATORY_CARE_PROVIDER_SITE_OTHER): Payer: Medicare HMO | Admitting: Interventional Cardiology

## 2019-04-26 ENCOUNTER — Other Ambulatory Visit: Payer: Self-pay

## 2019-04-26 VITALS — BP 132/70 | HR 54 | Ht 65.5 in | Wt 229.0 lb

## 2019-04-26 DIAGNOSIS — Z7901 Long term (current) use of anticoagulants: Secondary | ICD-10-CM | POA: Diagnosis not present

## 2019-04-26 DIAGNOSIS — I10 Essential (primary) hypertension: Secondary | ICD-10-CM

## 2019-04-26 DIAGNOSIS — I2510 Atherosclerotic heart disease of native coronary artery without angina pectoris: Secondary | ICD-10-CM

## 2019-04-26 DIAGNOSIS — E782 Mixed hyperlipidemia: Secondary | ICD-10-CM | POA: Diagnosis not present

## 2019-04-26 DIAGNOSIS — R0602 Shortness of breath: Secondary | ICD-10-CM | POA: Diagnosis not present

## 2019-04-26 DIAGNOSIS — I480 Paroxysmal atrial fibrillation: Secondary | ICD-10-CM | POA: Diagnosis not present

## 2019-04-26 DIAGNOSIS — Z01812 Encounter for preprocedural laboratory examination: Secondary | ICD-10-CM

## 2019-04-26 NOTE — Patient Instructions (Addendum)
Medication Instructions:  Your physician recommends that you continue on your current medications as directed. Please refer to the Current Medication list given to you today.  If you need a refill on your cardiac medications before your next appointment, please call your pharmacy.   Lab work: Your physician recommends that you return for lab work Artist) prior to your Cardiac CT   If you have labs (blood work) drawn today and your tests are completely normal, you will receive your results only by: Marland Kitchen MyChart Message (if you have MyChart) OR . A paper copy in the mail If you have any lab test that is abnormal or we need to change your treatment, we will call you to review the results.  Testing/Procedures: Your physician has requested that you have cardiac CT. Cardiac computed tomography (CT) is a painless test that uses an x-ray machine to take clear, detailed pictures of your heart. For further information please visit [email or web address]. Please follow instruction sheet as given.    Follow-Up: At [facility], you and your health needs are our priority.  As part of our continuing mission to provide you with exceptional heart care, we have created designated Provider Care Teams.  These Care Teams include your primary Cardiologist (physician) and Advanced Practice Providers (APPs -  Physician Assistants and Nurse Practitioners) who all work together to provide you with the care you need, when you need it. . You will need a follow up appointment in 1 year.  Please call our office 2 months in advance to schedule this appointment.  You may see Casandra Doffing, MD or one of the following Advanced Practice Providers on your designated Care Team:   . Lyda Jester, PA-C . Dayna Dunn, PA-C . Ermalinda Barrios, PA-C  Any Other Special Instructions Will Be Listed Below (If Applicable).  CARDIAC CT INSTRUCTIONS  Your cardiac CT will be scheduled at one of the below locations:   [facility] [address] [place], [place] [address] [phone] [place] [address] [place], [place] [address] [phone]  Please arrive at the [facility] main entrance of [facility] 30-45 minutes prior to test start time. Proceed to the [facility] Radiology Department (first floor) to check-in and test prep.  Please follow these instructions carefully (unless otherwise directed):   On the Night Before the Test: . Be sure to Drink plenty of water. . Do not consume any caffeinated/decaffeinated beverages or chocolate 12 hours prior to your test. . Do not take any antihistamines 12 hours prior to your test.   On the Day of the Test: . Drink plenty of water. Do not drink any water within one hour of the test. . Do not eat any food 4 hours prior to the test. . You may take your regular medications prior to the test.  . FEMALES- please wear underwire-free bra if available         After the Test: . Drink plenty of water. . After receiving IV contrast, you may experience a mild flushed feeling. This is normal. . On occasion, you may experience a mild rash up to 24 hours after the test. This is not dangerous. If this occurs, you can take Benadryl 25 mg and increase your fluid intake. . If you experience trouble breathing, this can be serious. If it is severe call 911 IMMEDIATELY. If it is mild, please call our office.    Please contact the cardiac  imaging nurse navigator should you have any questions/concerns Marchia Bond, RN Navigator Cardiac Imaging [facility] [phone] Office  [phone] Cell

## 2019-04-28 ENCOUNTER — Ambulatory Visit
Admission: RE | Admit: 2019-04-28 | Discharge: 2019-04-28 | Disposition: A | Discharge status: Home / Self Care | Payer: Medicare HMO | Source: Ambulatory Visit | Attending: Obstetrics and Gynecology | Admitting: Obstetrics and Gynecology

## 2019-04-28 ENCOUNTER — Other Ambulatory Visit: Payer: Self-pay

## 2019-04-28 DIAGNOSIS — Z1231 Encounter for screening mammogram for malignant neoplasm of breast: Secondary | ICD-10-CM | POA: Diagnosis not present

## 2019-04-30 ENCOUNTER — Other Ambulatory Visit: Payer: Self-pay | Admitting: Interventional Cardiology

## 2019-05-03 NOTE — Telephone Encounter (Signed)
Age 73, weight 104kg, SCr 0.87 on 01/04/19 in [place], last OV Sept 2020, afib indication

## 2019-05-10 ENCOUNTER — Telehealth (HOSPITAL_COMMUNITY): Payer: Self-pay | Admitting: Emergency Medicine

## 2019-05-10 ENCOUNTER — Telehealth: Payer: Self-pay | Admitting: Emergency Medicine

## 2019-05-10 NOTE — Telephone Encounter (Signed)
Pt calling to schedule CCTA, informed her that a member of the scheduling team would be reaching out to her after her insurance pre-cert is complete. Pt verbalized understanding.   Marchia Bond RN Navigator Cardiac Imaging [facility] Services [phone] Office  [phone] Cell

## 2019-05-10 NOTE — Telephone Encounter (Signed)
I called the patient about the ct it's not due till Jan the recall she received was for Dr Lamonte Sakai in Jan I told her we call about a month ahead to do the Ct in Jan

## 2019-05-15 DIAGNOSIS — M179 Osteoarthritis of knee, unspecified: Secondary | ICD-10-CM | POA: Diagnosis not present

## 2019-05-15 DIAGNOSIS — G4733 Obstructive sleep apnea (adult) (pediatric): Secondary | ICD-10-CM | POA: Diagnosis not present

## 2019-05-16 ENCOUNTER — Telehealth (HOSPITAL_COMMUNITY): Payer: Self-pay | Admitting: Emergency Medicine

## 2019-05-16 ENCOUNTER — Other Ambulatory Visit: Payer: Self-pay

## 2019-05-16 ENCOUNTER — Other Ambulatory Visit: Payer: Medicare HMO | Admitting: *Deleted

## 2019-05-16 DIAGNOSIS — Z01812 Encounter for preprocedural laboratory examination: Secondary | ICD-10-CM

## 2019-05-16 DIAGNOSIS — R0602 Shortness of breath: Secondary | ICD-10-CM | POA: Diagnosis not present

## 2019-05-16 DIAGNOSIS — I2510 Atherosclerotic heart disease of native coronary artery without angina pectoris: Secondary | ICD-10-CM | POA: Diagnosis not present

## 2019-05-16 LAB — BASIC METABOLIC PANEL
BUN/Creatinine Ratio: 18 (ref 12–28)
BUN: 15 mg/dL (ref 8–27)
CO2: 25 mmol/L (ref 20–29)
Calcium: 9.5 mg/dL (ref 8.7–10.3)
Chloride: 97 mmol/L (ref 96–106)
Creatinine, Ser: 0.84 mg/dL (ref 0.57–1.00)
GFR calc Af Amer: 80 mL/min/{1.73_m2} (ref >59)
GFR calc non Af Amer: 69 mL/min/{1.73_m2} (ref >59)
Glucose: 125 mg/dL — ABNORMAL HIGH (ref 65–99)
Potassium: 4.3 mmol/L (ref 3.5–5.2)
Sodium: 137 mmol/L (ref 134–144)

## 2019-05-16 NOTE — Telephone Encounter (Signed)
Reaching out to patient to offer assistance regarding upcoming cardiac imaging study; pt verbalizes understanding of appt date/time, parking situation and where to check in, pre-test NPO status and medications ordered, and verified current allergies; name and call back number provided for further questions should they arise Harry Shuck RN Navigator Cardiac Imaging [facility] [phone] office [phone] cell 

## 2019-05-18 ENCOUNTER — Other Ambulatory Visit: Payer: Self-pay

## 2019-05-18 ENCOUNTER — Ambulatory Visit (HOSPITAL_COMMUNITY)
Admission: RE | Admit: 2019-05-18 | Discharge: 2019-05-18 | Disposition: A | Discharge status: Home / Self Care | Payer: Medicare HMO | Source: Ambulatory Visit | Attending: Interventional Cardiology | Admitting: Interventional Cardiology

## 2019-05-18 ENCOUNTER — Encounter (HOSPITAL_COMMUNITY): Payer: Self-pay

## 2019-05-18 DIAGNOSIS — R0602 Shortness of breath: Secondary | ICD-10-CM | POA: Diagnosis not present

## 2019-05-18 DIAGNOSIS — I2510 Atherosclerotic heart disease of native coronary artery without angina pectoris: Secondary | ICD-10-CM | POA: Insufficient documentation

## 2019-05-18 MED ORDER — IOHEXOL 350 MG/ML SOLN
80.0000 mL | Freq: Once | INTRAVENOUS | Status: AC | PRN
  Administered 2019-05-18: 80 mL via INTRAVENOUS

## 2019-05-18 MED ORDER — NITROGLYCERIN 0.4 MG SL SUBL
0.8000 mg | SUBLINGUAL_TABLET | Freq: Once | SUBLINGUAL | Status: AC
  Administered 2019-05-18: 16:00:00 0.8 mg via SUBLINGUAL
  Filled 2019-05-18: qty 25

## 2019-05-18 MED ORDER — NITROGLYCERIN 0.4 MG SL SUBL
SUBLINGUAL_TABLET | SUBLINGUAL | Status: AC
  Administered 2019-05-18: 0.8 mg via SUBLINGUAL
  Filled 2019-05-18: qty 2

## 2019-05-18 NOTE — Progress Notes (Signed)
Pt tolerated exam without incident.  PIV removed, dressing applied.  Pt provided with beverage and snack.  Discharge instructions discussed, all questions answered

## 2019-05-18 NOTE — Discharge Instructions (Signed)
Testing With IV Contrast Material °IV contrast material is a fluid that is used with some imaging tests. It is injected into your body through a vein. Contrast material is used when your health care providers need a detailed look at organs, tissues, or blood vessels that may not show up with the standard test. The material may be used when an X-ray, an MRI, a CT scan, or an ultrasound is done. °IV contrast material may be used for imaging tests that check: °· Muscles, skin, and fat. °· Breasts. °· Brain. °· Digestive tract. °· Heart. °· Organs such as the liver, kidneys, lungs, bladder, and many others. °· Arteries and veins. °Tell a health care provider about: °· Any allergies you have, especially an allergy to contrast material. °· All medicines you are taking, including metformin, beta blockers, NSAIDs (such as ibuprofen), interleukin-2, vitamins, herbs, eye drops, creams, and over-the-counter medicines. °· Any problems you or family members have had with the use of contrast material. °· Any blood disorders you have, such as sickle cell anemia. °· Any surgeries you have had. °· Any medical conditions you have or have had, especially alcohol abuse, dehydration, asthma, or kidney, liver, or heart problems. °· Whether you are pregnant or may be pregnant. °· Whether you are breastfeeding. Most contrast materials are safe for use in breastfeeding women. °What are the risks? °Generally, this is a safe procedure. However, problems may occur, including: °· Headache. °· Itching, skin rash, and hives. °· Nausea and vomiting. °· Allergic reactions. °· Wheezing or difficulty breathing. °· Abnormal heart rate. °· Changes in blood pressure. °· Throat swelling. °· Kidney damage. °What happens before the procedure? °Medicines °Ask your health care provider about: °· Changing or stopping your regular medicines. This is especially important if you are taking diabetes medicines or blood thinners. °· Taking medicines such as aspirin  and ibuprofen. These medicines can thin your blood. Do not take these medicines unless your health care provider tells you to take them. °· Taking over-the-counter medicines, vitamins, herbs, and supplements. °If you are at risk of having a reaction to the IV contrast material, you may be asked to take medicine before the procedure to prevent a reaction. °General instructions °· Follow instructions from your health care provider about eating or drinking restrictions. °· You may have an exam or lab tests to make sure that you can safely get IV contrast material. °· Ask if you will be given a medicine to help you relax (sedative) during the procedure. If so, plan to have someone take you home from the hospital or clinic. °What happens during the procedure? °· You may be given a sedative to help you relax. °· An IV will be inserted into one of your veins. °· Contrast material will be injected into your IV. °· You may feel warmth or flushing as the contrast material enters your bloodstream. °· You may have a metallic taste in your mouth for a few minutes. °· The needle may cause some discomfort and bruising. °· After the contrast material is in your body, the imaging test will be done. °The procedure may vary among health care providers and hospitals. °What can I expect after the procedure? °· The IV will be removed. °· You may be taken to a recovery area if sedation medicines were used. Your blood pressure, heart rate, breathing rate, and blood oxygen level will be monitored until you leave the hospital or clinic. °Follow these instructions at home: ° °· Take over-the-counter and   prescription medicines only as told by your health care provider. °? Your health care provider may tell you to not take certain medicines for a couple of days after the procedure. This is especially important if you are taking diabetes medicines. °· If you are told, drink enough fluid to keep your urine pale yellow. This will help to remove  the contrast material out of your body. °· Do not drive for 24 hours if you were given a sedative during your procedure. °· It is up to you to get the results of your procedure. Ask your health care provider, or the department that is doing the procedure, when your results will be ready. °· Keep all follow-up visits as told by your health care provider. This is important. °Contact a health care provider if: °· You have redness, swelling, or pain near your IV site. °Get help right away if: °· You have an abnormal heart rhythm. °· You have trouble breathing. °· You have: °? Chest pain. °? Pain in your back, neck, arm, jaw, or stomach. °? Nausea or sweating. °? Hives or a rash. °· You start shaking and cannot stop. °These symptoms may represent a serious problem that is an emergency. Do not wait to see if the symptoms will go away. Get medical help right away. Call your local emergency services (911 in the [place] Do not drive yourself to the hospital. °Summary °· IV contrast material may be used for imaging tests to help your health care providers see your organs and tissues more clearly. °· Tell your health care provider if you are pregnant or may be pregnant. °· During the procedure, you may feel warmth or flushing as the contrast material enters your bloodstream. °· After the procedure, drink enough fluid to keep your urine pale yellow. °This information is not intended to replace advice given to you by your health care provider. Make sure you discuss any questions you have with your health care provider. °Document Released: 07/30/2009 Document Revised: 10/28/2018 Document Reviewed: 10/28/2018 °Elsevier Patient Education © 2020 [employer] ° ° °Cardiac CT Angiogram ° °A cardiac CT angiogram is a procedure to look at the heart and the area around the heart. It may be done to help find the cause of chest pains or other symptoms of heart disease. During this procedure, a large X-ray machine, called a CT scanner,  takes detailed pictures of the heart and the surrounding area after a dye (contrast material) has been injected into blood vessels in the area. The procedure is also sometimes called a coronary CT angiogram, coronary artery scanning, or CTA. °A cardiac CT angiogram allows the health care provider to see how well blood is flowing to and from the heart. The health care provider will be able to see if there are any problems, such as: °· Blockage or narrowing of the coronary arteries in the heart. °· Fluid around the heart. °· Signs of weakness or disease in the muscles, valves, and tissues of the heart. °Tell a health care provider about: °· Any allergies you have. This is especially important if you have had a previous allergic reaction to contrast dye. °· All medicines you are taking, including vitamins, herbs, eye drops, creams, and over-the-counter medicines. °· Any blood disorders you have. °· Any surgeries you have had. °· Any medical conditions you have. °· Whether you are pregnant or may be pregnant. °· Any anxiety disorders, chronic pain, or other conditions you have that may increase your stress or prevent   you from lying still. °What are the risks? °Generally, this is a safe procedure. However, problems may occur, including: °· Bleeding. °· Infection. °· Allergic reactions to medicines or dyes. °· Damage to other structures or organs. °· Kidney damage from the dye or contrast that is used. °· Increased risk of cancer from radiation exposure. This risk is low. Talk with your health care provider about: °? The risks and benefits of testing. °? How you can receive the lowest dose of radiation. °What happens before the procedure? °· Wear comfortable clothing and remove any jewelry, glasses, dentures, and hearing aids. °· Follow instructions from your health care provider about eating and drinking. This may include: °? For 12 hours before the test -- avoid caffeine. This includes tea, coffee, soda, energy drinks,  and diet pills. Drink plenty of water or other fluids that do not have caffeine in them. Being well-hydrated can prevent complications. °? For 4-6 hours before the test -- stop eating and drinking. The contrast dye can cause nausea, but this is less likely if your stomach is empty. °· Ask your health care provider about changing or stopping your regular medicines. This is especially important if you are taking diabetes medicines, blood thinners, or medicines to treat erectile dysfunction. °What happens during the procedure? °· Hair on your chest may need to be removed so that small sticky patches called electrodes can be placed on your chest. These will transmit information that helps to monitor your heart during the test. °· An IV tube will be inserted into one of your veins. °· You might be given a medicine to control your heart rate during the test. This will help to ensure that good images are obtained. °· You will be asked to lie on an exam table. This table will slide in and out of the CT machine during the procedure. °· Contrast dye will be injected into the IV tube. You might feel warm, or you may get a metallic taste in your mouth. °· You will be given a medicine (nitroglycerin) to relax (dilate) the arteries in your heart. °· The table that you are lying on will move into the CT machine tunnel for the scan. °· The person running the machine will give you instructions while the scans are being done. You may be asked to: °? Keep your arms above your head. °? Hold your breath. °? Stay very still, even if the table is moving. °· When the scanning is complete, you will be moved out of the machine. °· The IV tube will be removed. °The procedure may vary among health care providers and hospitals. °What happens after the procedure? °· You might feel warm, or you may get a metallic taste in your mouth from the contrast dye. °· You may have a headache from the nitroglycerin. °· After the procedure, drink water or  other fluids to wash (flush) the contrast material out of your body. °· Contact a health care provider if you have any symptoms of allergy to the contrast. These symptoms include: °? Shortness of breath. °? Rash or hives. °? A racing heartbeat. °· Most people can return to their normal activities right after the procedure. Ask your health care provider what activities are safe for you. °· It is up to you to get the results of your procedure. Ask your health care provider, or the department that is doing the procedure, when your results will be ready. °Summary °· A cardiac CT angiogram is a procedure to   look at the heart and the area around the heart. It may be done to help find the cause of chest pains or other symptoms of heart disease. °· During this procedure, a large X-ray machine, called a CT scanner, takes detailed pictures of the heart and the surrounding area after a dye (contrast material) has been injected into blood vessels in the area. °· Ask your health care provider about changing or stopping your regular medicines before the procedure. This is especially important if you are taking diabetes medicines, blood thinners, or medicines to treat erectile dysfunction. °· After the procedure, drink water or other fluids to wash (flush) the contrast material out of your body. °This information is not intended to replace advice given to you by your health care provider. Make sure you discuss any questions you have with your health care provider. °Document Released: 07/24/2008 Document Revised: 07/24/2017 Document Reviewed: 06/30/2016 °Elsevier Patient Education © 2020 [employer] ° °

## 2019-05-24 DIAGNOSIS — Z23 Encounter for immunization: Secondary | ICD-10-CM | POA: Diagnosis not present

## 2019-05-25 DIAGNOSIS — R69 Illness, unspecified: Secondary | ICD-10-CM | POA: Diagnosis not present

## 2019-05-25 DIAGNOSIS — N183 Chronic kidney disease, stage 3 (moderate): Secondary | ICD-10-CM | POA: Diagnosis not present

## 2019-05-25 DIAGNOSIS — I480 Paroxysmal atrial fibrillation: Secondary | ICD-10-CM | POA: Diagnosis not present

## 2019-05-25 DIAGNOSIS — E782 Mixed hyperlipidemia: Secondary | ICD-10-CM | POA: Diagnosis not present

## 2019-05-25 DIAGNOSIS — E1169 Type 2 diabetes mellitus with other specified complication: Secondary | ICD-10-CM | POA: Diagnosis not present

## 2019-05-25 DIAGNOSIS — I10 Essential (primary) hypertension: Secondary | ICD-10-CM | POA: Diagnosis not present

## 2019-06-14 DIAGNOSIS — G4733 Obstructive sleep apnea (adult) (pediatric): Secondary | ICD-10-CM | POA: Diagnosis not present

## 2019-06-14 DIAGNOSIS — M179 Osteoarthritis of knee, unspecified: Secondary | ICD-10-CM | POA: Diagnosis not present

## 2019-06-17 DIAGNOSIS — M79671 Pain in right foot: Secondary | ICD-10-CM | POA: Diagnosis not present

## 2019-06-17 DIAGNOSIS — M79672 Pain in left foot: Secondary | ICD-10-CM | POA: Diagnosis not present

## 2019-06-17 DIAGNOSIS — M722 Plantar fascial fibromatosis: Secondary | ICD-10-CM | POA: Diagnosis not present

## 2019-06-17 DIAGNOSIS — M19071 Primary osteoarthritis, right ankle and foot: Secondary | ICD-10-CM | POA: Diagnosis not present

## 2019-06-30 DIAGNOSIS — Z85828 Personal history of other malignant neoplasm of skin: Secondary | ICD-10-CM | POA: Diagnosis not present

## 2019-06-30 DIAGNOSIS — J309 Allergic rhinitis, unspecified: Secondary | ICD-10-CM | POA: Diagnosis not present

## 2019-06-30 DIAGNOSIS — E1169 Type 2 diabetes mellitus with other specified complication: Secondary | ICD-10-CM | POA: Diagnosis not present

## 2019-06-30 DIAGNOSIS — E782 Mixed hyperlipidemia: Secondary | ICD-10-CM | POA: Diagnosis not present

## 2019-06-30 DIAGNOSIS — I10 Essential (primary) hypertension: Secondary | ICD-10-CM | POA: Diagnosis not present

## 2019-06-30 DIAGNOSIS — J45909 Unspecified asthma, uncomplicated: Secondary | ICD-10-CM | POA: Diagnosis not present

## 2019-06-30 DIAGNOSIS — I480 Paroxysmal atrial fibrillation: Secondary | ICD-10-CM | POA: Diagnosis not present

## 2019-06-30 DIAGNOSIS — G4730 Sleep apnea, unspecified: Secondary | ICD-10-CM | POA: Diagnosis not present

## 2019-06-30 DIAGNOSIS — R69 Illness, unspecified: Secondary | ICD-10-CM | POA: Diagnosis not present

## 2019-06-30 DIAGNOSIS — N183 Chronic kidney disease, stage 3 unspecified: Secondary | ICD-10-CM | POA: Diagnosis not present

## 2019-07-15 DIAGNOSIS — G4733 Obstructive sleep apnea (adult) (pediatric): Secondary | ICD-10-CM | POA: Diagnosis not present

## 2019-07-15 DIAGNOSIS — M179 Osteoarthritis of knee, unspecified: Secondary | ICD-10-CM | POA: Diagnosis not present

## 2019-07-25 DIAGNOSIS — I480 Paroxysmal atrial fibrillation: Secondary | ICD-10-CM | POA: Diagnosis not present

## 2019-07-25 DIAGNOSIS — R69 Illness, unspecified: Secondary | ICD-10-CM | POA: Diagnosis not present

## 2019-07-25 DIAGNOSIS — E782 Mixed hyperlipidemia: Secondary | ICD-10-CM | POA: Diagnosis not present

## 2019-07-25 DIAGNOSIS — I10 Essential (primary) hypertension: Secondary | ICD-10-CM | POA: Diagnosis not present

## 2019-07-25 DIAGNOSIS — E1169 Type 2 diabetes mellitus with other specified complication: Secondary | ICD-10-CM | POA: Diagnosis not present

## 2019-08-25 DIAGNOSIS — I10 Essential (primary) hypertension: Secondary | ICD-10-CM | POA: Diagnosis not present

## 2019-08-25 DIAGNOSIS — I480 Paroxysmal atrial fibrillation: Secondary | ICD-10-CM | POA: Diagnosis not present

## 2019-08-25 DIAGNOSIS — E1169 Type 2 diabetes mellitus with other specified complication: Secondary | ICD-10-CM | POA: Diagnosis not present

## 2019-08-25 DIAGNOSIS — E782 Mixed hyperlipidemia: Secondary | ICD-10-CM | POA: Diagnosis not present

## 2019-08-25 DIAGNOSIS — R69 Illness, unspecified: Secondary | ICD-10-CM | POA: Diagnosis not present

## 2019-09-09 NOTE — Telephone Encounter (Signed)
Please let her know that I looked at the cardiac CT. Her pulmonary nodule is unchanged compared with our priors - that gives ~20 months of stability. We like to follow for 24-72 months. I think we can defer a January scan, decide the timing of any appropriate repeat scan when we see each other  Please make her an OV to see me since she has noticed changes in her breathing. Thanks.

## 2019-09-12 ENCOUNTER — Inpatient Hospital Stay: Admission: RE | Admit: 2019-09-12 | Payer: Medicare HMO | Source: Ambulatory Visit

## 2019-09-20 ENCOUNTER — Encounter: Payer: Self-pay | Admitting: Emergency Medicine

## 2019-09-20 ENCOUNTER — Other Ambulatory Visit: Payer: Self-pay

## 2019-09-20 ENCOUNTER — Ambulatory Visit (INDEPENDENT_AMBULATORY_CARE_PROVIDER_SITE_OTHER): Payer: Medicare Other | Admitting: Emergency Medicine

## 2019-09-20 DIAGNOSIS — J449 Chronic obstructive pulmonary disease, unspecified: Secondary | ICD-10-CM | POA: Insufficient documentation

## 2019-09-20 DIAGNOSIS — R05 Cough: Secondary | ICD-10-CM

## 2019-09-20 DIAGNOSIS — R911 Solitary pulmonary nodule: Secondary | ICD-10-CM

## 2019-09-20 DIAGNOSIS — R053 Chronic cough: Secondary | ICD-10-CM

## 2019-09-20 NOTE — Patient Instructions (Signed)
We will plan to repeat your CT scan of the chest without contrast in January 2022 to follow your small pulmonary nodule for stability. Try increasing your Pepcid to 20 mg twice a day. Consider starting a generic nonsedating antihistamine such as loratadine (Claritin) 10 mg once daily to see if this helps with nasal congestion and cough. Keep your albuterol available to use 2 puffs if needed for shortness of breath, chest tightness, wheezing, coughing.  We will give you a spacer to see if this makes taking the medication easier and more effective.  You may also want to try taking 2 puffs about 10 minutes before exertion, for example when you take your daily walk. Continue your CPAP as managed by Dr. Elenore Rota. Follow with Dr Lamonte Sakai in 6 months or sooner if you have any problems

## 2019-09-20 NOTE — Assessment & Plan Note (Signed)
With probable components from untreated allergic rhinitis and undertreated GERD.  She has modified her diet which is made the GERD somewhat better.  We will try increasing Pepcid to twice a day, and an antihistamine.  She will continue to use albuterol as needed for coughing spells because she has gotten some benefit from this in the past.  Try increasing your Pepcid to 20 mg twice a day. Consider starting a generic nonsedating antihistamine such as loratadine (Claritin) 10 mg once daily to see if this helps with nasal congestion and cough. Follow with Dr Lamonte Sakai in 6 months or sooner if you have any problems

## 2019-09-20 NOTE — Assessment & Plan Note (Signed)
Plan to repeat your CT scan of the chest no later than January 2022

## 2019-09-20 NOTE — Assessment & Plan Note (Signed)
Continue albuterol as needed.  Depending on her frequency of use we may decide to add a long-acting bronchodilator at her next visit.

## 2019-09-20 NOTE — Progress Notes (Signed)
Subjective:    Patient ID: Shannon Aguilar, female    DOB: 1946-05-17, 74 y.o.   MRN: [id number]  HPI  ROV 09/16/2018 --follow-up visit for 74 year old woman with a history of tobacco use (20 pack years), OSA on CPAP, chronic cough in the setting of allergic rhinitis. PFT with mixed obstruction and restriction. She has a 5 mm right upper lobe nodule found on CT scan 09/04/2017 that was done to evaluate for possible bronchiectasis.  No bronchiectasis was seen. Her cough has been well controlled except for when she has certain exposures. She has albuterol, has used it rarely. Good compliance, has equipment for her CPAP. She does sometimes nap during the day. Sleeps through most nights. Sees Dr Isidoro Donning.   ROV 09/20/2019 --74 year old woman with a history of former tobacco, OSA on CPAP, mixed obstruction and restriction on pulmonary function testing.  Also with chronic cough in the setting of this and allergic rhinitis.  We had followed a CT of the chest to look for bronchiectasis (none seen) but she did have a 5 mm right upper lobe nodule.  This was stable on a 1 year follow-up and then again on a cardiac CT done in September 2020. She has restarted exercise, walking. Her walk distance is improving but she has trouble with some hills. She uses albuterol rarely - often for cough. She is having some mildew exposure, outside antigens. She has had some breakthrough GERD on pepcid qd. Remains on CPAP per Dr Isidoro Donning.    Review of Systems  Constitutional: Negative for fever and unexpected weight change.  HENT: Negative for congestion, dental problem, ear pain, nosebleeds, postnasal drip, rhinorrhea, sinus pressure, sneezing, sore throat and trouble swallowing.   Eyes: Negative for redness and itching.  Respiratory: Positive for cough. Negative for chest tightness, shortness of breath and wheezing.   Cardiovascular: Negative for palpitations and leg swelling.  Gastrointestinal: Negative for nausea  and vomiting.  Genitourinary: Negative for dysuria.  Musculoskeletal: Negative for joint swelling.  Skin: Negative for rash.  Neurological: Negative for headaches.  Hematological: Does not bruise/bleed easily.  Psychiatric/Behavioral: Negative for dysphoric mood. The patient is not nervous/anxious.     Past Medical History:  Diagnosis Date  . Anxiety   . Arthritis 02-25-12   arthritis-neck, fingers, hips, knees  . Chronic kidney disease 02-25-12   kidney stones- litho's x2  . Depression   . Dysrhythmia 2006   hx. A. Fib, no recent problems -rate control-NSR  at present, 2 or 3 events 2017  . History of blood transfusion 1978 and 1981  . History of skin cancer   . Hypertension   . Pneumonia as child  . Pre-diabetes   . Rash    red area chest from heart monitor on chest and abdomen areas healing  . Sleep apnea 02-25-12   uses cpap since 2000 setting between 9 and 13     Family History  Problem Relation Age of Onset  . Hypertension Mother   . Arthritis Mother   . Heart attack Mother   . Heart disease Father   . Prostate cancer Father   . Diabetes Father   . Heart attack Father   . Hypertension Brother   . Hypertension Brother   . Hypothyroidism Sister   . Hypercholesterolemia Sister      Social History   Socioeconomic History  . Marital status: Married    Spouse name: Not on file  . Number of children: Not on file  . Years  of education: Not on file  . Highest education level: Not on file  Occupational History  . Not on file  Tobacco Use  . Smoking status: Former Smoker    Packs/day: 1.00    Years: 20.00    Pack years: 20.00    Types: Cigarettes    Quit date: 02/25/1991    Years since quitting: 28.5  . Smokeless tobacco: Never Used  Substance and Sexual Activity  . Alcohol use: Yes    Comment: rare- social  . Drug use: No  . Sexual activity: Yes  Other Topics Concern  . Not on file  Social History Narrative  . Not on file   Social Determinants of Health    Financial Resource Strain:   . Difficulty of Paying Living Expenses: Not on file  Food Insecurity:   . Worried About Charity fundraiser in the Last Year: Not on file  . Ran Out of Food in the Last Year: Not on file  Transportation Needs:   . Lack of Transportation (Medical): Not on file  . Lack of Transportation (Non-Medical): Not on file  Physical Activity:   . Days of Exercise per Week: Not on file  . Minutes of Exercise per Session: Not on file  Stress:   . Feeling of Stress : Not on file  Social Connections:   . Frequency of Communication with Friends and Family: Not on file  . Frequency of Social Gatherings with Friends and Family: Not on file  . Attends Religious Services: Not on file  . Active Member of Clubs or Organizations: Not on file  . Attends Archivist Meetings: Not on file  . Marital Status: Not on file  Intimate Partner Violence:   . Fear of Current or Ex-Partner: Not on file  . Emotionally Abused: Not on file  . Physically Abused: Not on file  . Sexually Abused: Not on file     Allergies  Allergen Reactions  . Milk-Related Compounds Diarrhea  . Penicillins Hives and Rash    ++ got ancef in 2013++ Has patient had a PCN reaction causing immediate rash, facial/tongue/throat swelling, SOB or lightheadedness with hypotension: unk Has patient had a PCN reaction causing severe rash involving mucus membranes or skin necrosis: no Has patient had a PCN reaction that required hospitalization: no Has patient had a PCN reaction occurring within the last 10 years: no If all of the above answers are "NO", then may proceed with Cephalosporin use.     Outpatient Medications Prior to Visit  Medication Sig Dispense Refill  . acetaminophen (TYLENOL) 500 MG tablet Take 1,000 mg by mouth every 6 (six) hours as needed for mild pain.    Marland Kitchen albuterol (PROVENTIL HFA;VENTOLIN HFA) 108 (90 Base) MCG/ACT inhaler Inhale 2 puffs into the lungs every 6 (six) hours as needed  for wheezing or shortness of breath. 1 Inhaler 5  . amLODipine (NORVASC) 2.5 MG tablet Take 2.5 mg by mouth daily.     . citalopram (CELEXA) 20 MG tablet Take 20 mg by mouth every evening.     Marland Kitchen ELIQUIS 5 MG TABS tablet Take 1 tablet by mouth twice daily 60 tablet 5  . lovastatin (MEVACOR) 10 MG tablet Take 10 mg by mouth once a week.     . metoprolol tartrate (LOPRESSOR) 25 MG tablet Take 1 tablet (25 mg total) by mouth 2 (two) times daily. Please keep upcoming appt in September for future refills. Thank you 180 tablet 0  . Polyethyl  Glycol-Propyl Glycol (SYSTANE OP) Apply 1 drop to eye 2 (two) times daily as needed (for dry eyes). Dry eyes    . triamterene-hydrochlorothiazide (MAXZIDE) 75-50 MG tablet Take 1 tablet by mouth daily.     No facility-administered medications prior to visit.        Objective:   Physical Exam Vitals:   09/20/19 1056  BP: 122/68  Pulse: (!) 57  Temp: 98.4 F (36.9 C)  TempSrc: Temporal  SpO2: 99%  Weight: 233 lb (105.7 kg)  Height: 5' 5.5" (1.664 m)   Gen: Pleasant, overwt, in no distress,  normal affect  ENT: No lesions,  mouth clear,  oropharynx clear, no postnasal drip  Neck: No JVD, no stridor  Lungs: No use of accessory muscles, clear without rales or rhonchi  Cardiovascular: RRR, heart sounds normal, no murmur or gallops, no peripheral edema  Musculoskeletal: No deformities, no cyanosis or clubbing  Neuro: alert, non focal  Skin: Warm, no lesions or rashes      Assessment & Plan:  Chronic cough With probable components from untreated allergic rhinitis and undertreated GERD.  She has modified her diet which is made the GERD somewhat better.  We will try increasing Pepcid to twice a day, and an antihistamine.  She will continue to use albuterol as needed for coughing spells because she has gotten some benefit from this in the past.  Try increasing your Pepcid to 20 mg twice a day. Consider starting a generic nonsedating antihistamine  such as loratadine (Claritin) 10 mg once daily to see if this helps with nasal congestion and cough. Follow with Dr Lamonte Sakai in 6 months or sooner if you have any problems  Solitary pulmonary nodule Plan to repeat your CT scan of the chest no later than January 2022  COPD (chronic obstructive pulmonary disease) (HCC) Continue albuterol as needed.  Depending on her frequency of use we may decide to add a long-acting bronchodilator at her next visit.  Baltazar Apo, MD, PhD 09/20/2019, 11:48 AM [facility] [phone] or if no answer [phone]

## 2019-10-31 ENCOUNTER — Other Ambulatory Visit: Payer: Self-pay | Admitting: Interventional Cardiology

## 2019-11-01 NOTE — Telephone Encounter (Signed)
Prescription refill request for Eliquis received.  Last office visit: 04/26/2019, [place] Scr: 0.84, 05/16/2019 Age: 74 y.o. Weight: 105.7 kg    Prescription refill sent.

## 2019-11-07 ENCOUNTER — Telehealth: Payer: Self-pay | Admitting: Emergency Medicine

## 2019-11-07 DIAGNOSIS — L989 Disorder of the skin and subcutaneous tissue, unspecified: Secondary | ICD-10-CM

## 2019-11-07 DIAGNOSIS — L509 Urticaria, unspecified: Secondary | ICD-10-CM

## 2019-11-07 NOTE — Telephone Encounter (Signed)
ATC pt, no answer. Left message for pt to call back.  

## 2019-11-07 NOTE — Telephone Encounter (Signed)
Patient is returning phone call.  Patient phone number is [phone].

## 2019-11-07 NOTE — Telephone Encounter (Signed)
Yes, either allergy or dermatology. I would make the allergy referral bow, we can consider derm referral in future. Thanks.

## 2019-11-07 NOTE — Telephone Encounter (Signed)
Spoke with pt, she states she has a terrible skin problem since October. Last Friday she was covered in hives and itchy for the last 3-4 days. She does agree with Dr. Lamonte Sakai that she needs to see an allergist. Can we send referral for an allergist?

## 2019-11-07 NOTE — Telephone Encounter (Signed)
Referral placed. I called pt but there was no answer and No VM to leave message. Will try again later.

## 2019-11-08 NOTE — Telephone Encounter (Signed)
Spoke with pt. She is aware that this referral has been placed. Nothing further was needed.

## 2019-12-02 ENCOUNTER — Ambulatory Visit: Payer: Self-pay | Admitting: Allergy

## 2020-03-29 ENCOUNTER — Other Ambulatory Visit: Payer: Self-pay | Admitting: Obstetrics and Gynecology

## 2020-03-29 DIAGNOSIS — Z1231 Encounter for screening mammogram for malignant neoplasm of breast: Secondary | ICD-10-CM

## 2020-04-26 ENCOUNTER — Encounter: Payer: Self-pay | Admitting: Interventional Cardiology

## 2020-04-26 ENCOUNTER — Ambulatory Visit (INDEPENDENT_AMBULATORY_CARE_PROVIDER_SITE_OTHER): Payer: Medicare Other | Admitting: Interventional Cardiology

## 2020-04-26 ENCOUNTER — Other Ambulatory Visit: Payer: Self-pay

## 2020-04-26 ENCOUNTER — Ambulatory Visit: Payer: BC Managed Care – PPO

## 2020-04-26 VITALS — BP 122/62 | HR 53 | Ht 65.5 in | Wt 187.6 lb

## 2020-04-26 DIAGNOSIS — I2510 Atherosclerotic heart disease of native coronary artery without angina pectoris: Secondary | ICD-10-CM | POA: Diagnosis not present

## 2020-04-26 DIAGNOSIS — I480 Paroxysmal atrial fibrillation: Secondary | ICD-10-CM | POA: Diagnosis not present

## 2020-04-26 DIAGNOSIS — R7303 Prediabetes: Secondary | ICD-10-CM

## 2020-04-26 DIAGNOSIS — I700 Atherosclerosis of aorta: Secondary | ICD-10-CM

## 2020-04-26 MED ORDER — TRIAMTERENE-HCTZ 75-50 MG PO TABS: 0.5000 | ORAL_TABLET | Freq: Every day | ORAL | 3 refills | Status: DC

## 2020-04-26 NOTE — Progress Notes (Signed)
Cardiology Office Note   Date:  04/26/2020   ID:  Shannon Aguilar [place], [place] 1946/04/28, MRN [id number]  PCP:  Antony Contras, MD    No chief complaint on file.  PAF  Wt Readings from Last 3 Encounters:  04/26/20 187 lb 9.6 oz (85.1 kg)  09/20/19 233 lb (105.7 kg)  04/26/19 229 lb (103.9 kg)       History of Present Illness: Shannon Aguilar is a 74 y.o. female  Who hadTKR in 12/17. She has had palpitations intermittently prior to this and had been diagnosed with PAF in 2006.   Her palpitationstypically occuredat night. The worst episode was on 05/17/16. It lasted 30 minutes. HR up to 149 once after a cold, but it resolved on its own before she got to urgent care.   In 2006, she had a cardiac eval. She had PAF at that time. Echo and stress test were done at that time. W/u was negative. That occurred after a trip to [place].  Further eval showed PAF in 12/17. She has been on anticoagulation since that time. In December 2017, she had knee replacement.  SHe has a brother with AFib, who had an ablation.  In 2019, She went to the ER once after eating beet chips and her stool was red- but this was found not to be bleeding.   Since the last visit, she has been more irritable.  She has been craving salt.  BP has been down as she has lost 40 lbs, though Optivia.  Home readings are in the 110-120 range.  She has cut down to metoprolol 12.5 BID.    Denies : Chest pain. Dizziness. Leg edema. Nitroglycerin use. Orthopnea. Palpitations. Paroxysmal nocturnal dyspnea. Shortness of breath. Syncope.   She got her COVID vaccines and is surprised in general at the high level of vaccine hesitancy.  Past Medical History:  Diagnosis Date  . Anxiety   . Arthritis 02-25-12   arthritis-neck, fingers, hips, knees  . Chronic kidney disease 02-25-12   kidney stones- litho's x2  . Depression   . Dysrhythmia 2006   hx. A. Fib, no recent problems -rate  control-NSR  at present, 2 or 3 events 2017  . History of blood transfusion 1978 and 1981  . History of skin cancer   . Hypertension   . Pneumonia as child  . Pre-diabetes   . Rash    red area chest from heart monitor on chest and abdomen areas healing  . Sleep apnea 02-25-12   uses cpap since 2000 setting between 9 and 13    Past Surgical History:  Procedure Laterality Date  . [place]   x2   . DILATION AND CURETTAGE OF UTERUS  02-25-12   abnormal vaginal bleeding- ? 5 yrs ago  . KNEE ARTHROSCOPY  03/03/2012   Procedure: ARTHROSCOPY KNEE;  Surgeon: Gearlean Alf, MD;  Location: [facility];  Service: Orthopedics;  Laterality: Right;  Right Knee Arthroscopy, medial meniscectomy and chrondroplasty  . LITHOTRIPSY  02-25-12   x2   . TONSILLECTOMY  02-25-12   child  . TOTAL KNEE ARTHROPLASTY Right 11/19/2015   Procedure: TOTAL KNEE ARTHROPLASTY;  Surgeon: Gaynelle Arabian, MD;  Location: [facility];  Service: Orthopedics;  Laterality: Right;  . TOTAL KNEE ARTHROPLASTY Left 07/28/2016   Procedure: LEFT TOTAL KNEE ARTHROPLASTY;  Surgeon: Gaynelle Arabian, MD;  Location: [facility];  Service: Orthopedics;  Laterality: Left;     Current Outpatient Medications  Medication Sig Dispense Refill  . acetaminophen (TYLENOL) 500 MG tablet Take 1,000 mg by mouth every 6 (six) hours as needed for mild pain.    Marland Kitchen albuterol (PROVENTIL HFA;VENTOLIN HFA) 108 (90 Base) MCG/ACT inhaler Inhale 2 puffs into the lungs every 6 (six) hours as needed for wheezing or shortness of breath. 1 Inhaler 5  . amLODipine (NORVASC) 2.5 MG tablet Take 2.5 mg by mouth daily.     . citalopram (CELEXA) 20 MG tablet Take 20 mg by mouth every evening.     Marland Kitchen ELIQUIS 5 MG TABS tablet Take 1 tablet by mouth twice daily 60 tablet 6  . metoprolol tartrate (LOPRESSOR) 25 MG tablet Take 12.5 mg by mouth 2 (two) times daily.    Vladimir Faster Glycol-Propyl Glycol (SYSTANE OP) Apply 1 drop to eye 2 (two) times daily as needed (for dry  eyes). Dry eyes    . triamterene-hydrochlorothiazide (MAXZIDE) 75-50 MG tablet Take 0.5 tablets by mouth daily. 45 tablet 3   No current facility-administered medications for this visit.    Allergies:   Milk-related compounds and Penicillins    Social History:  The patient  reports that she quit smoking about 29 years ago. Her smoking use included cigarettes. She has a 20.00 pack-year smoking history. She has never used smokeless tobacco. She reports current alcohol use. She reports that she does not use drugs.   Family History:  The patient's family history includes Arthritis in her mother; Diabetes in her father; Heart attack in her father and mother; Heart disease in her father; Hypercholesterolemia in her sister; Hypertension in her brother, brother, and mother; Hypothyroidism in her sister; Prostate cancer in her father.    ROS:  Please see the history of present illness.   Otherwise, review of systems are positive for intentional weight loss.   All other systems are reviewed and negative.    PHYSICAL EXAM: VS:  BP 122/62   Pulse (!) 53   Ht 5' 5.5" (1.664 m)   Wt 187 lb 9.6 oz (85.1 kg)   SpO2 97%   BMI 30.74 kg/m  , BMI Body mass index is 30.74 kg/m. GEN: Well nourished, well developed, in no acute distress  HEENT: normal  Neck: no JVD, carotid bruits, or masses Cardiac: bradycardic; no murmurs, rubs, or gallops,no edema  Respiratory:  clear to auscultation bilaterally, normal work of breathing GI: soft, nontender, nondistended, + BS MS: no deformity or atrophy  Skin: warm and dry, no rash Neuro:  Strength and sensation are intact Psych: euthymic mood, full affect   EKG:   The ekg ordered today demonstrates NSR, no ST changes   Recent Labs: 05/16/2019: BUN 15; Creatinine, Ser 0.84; Potassium 4.3; Sodium 137   Lipid Panel No results found for: CHOL, TRIG, HDL, CHOLHDL, VLDL, LDLCALC, LDLDIRECT   Other studies Reviewed: Additional studies/ records that were  reviewed today with results demonstrating: labs results reviewed .   ASSESSMENT AND PLAN:  1. AFib: Maintaining NSR.  No sx, even after she cut her metoprolol to 12.5 mg BID.  2. Coronary calcification: No angina.  Continue secondary prevention.  3. Aortic atherosclerosis: Lipids well controlled- reviewed from PMD check. Now able to get Livalo as a generic.  Tolerating this much better. No need to intensify given results of cardiac scan, Ca score 19 and 44th percentile.  4. PreDM: A1C 5.9, down from 6.5.  Healthy diet.  5. HTN: BP on the low side.  No edema.  Decrease Maxzide to 37.5/25  mg daily.    Current medicines are reviewed at length with the patient today.  The patient concerns regarding her medicines were addressed.  The following changes have been made: decrease maxzide  Labs/ tests ordered today include:   Orders Placed This Encounter  Procedures  . EKG 12-Lead    Recommend 150 minutes/week of aerobic exercise Low fat, low carb, high fiber diet recommended  Disposition:   FU in 1 year   Signed, Larae Grooms, MD  04/26/2020 11:16 AM    [facility] [address], [place], [place]  [address] Phone: [phone]; Fax: [phone]

## 2020-04-26 NOTE — Patient Instructions (Signed)
Medication Instructions:  Your physician has recommended you make the following change in your medication:   DECREASE: triamterene-hydrochlorothiazide (maxzide) 75-50 mg tablet: Take 1/2 tablet by mouth once a day  *If you need a refill on your cardiac medications before your next appointment, please call your pharmacy*   Lab Work: None  If you have labs (blood work) drawn today and your tests are completely normal, you will receive your results only by: Marland Kitchen MyChart Message (if you have MyChart) OR . A paper copy in the mail If you have any lab test that is abnormal or we need to change your treatment, we will call you to review the results.   Testing/Procedures: None   Follow-Up: At [facility], you and your health needs are our priority.  As part of our continuing mission to provide you with exceptional heart care, we have created designated Provider Care Teams.  These Care Teams include your primary Cardiologist (physician) and Advanced Practice Providers (APPs -  Physician Assistants and Nurse Practitioners) who all work together to provide you with the care you need, when you need it.  We recommend signing up for the patient portal called "MyChart".  Sign up information is provided on this After Visit Summary.  MyChart is used to connect with patients for Virtual Visits (Telemedicine).  Patients are able to view lab/test results, encounter notes, upcoming appointments, etc.  Non-urgent messages can be sent to your provider as well.   To learn more about what you can do with MyChart, go to [email or web address].    Your next appointment:   12 month(s)  The format for your next appointment:   In Person  Provider:   You may see Larae Grooms, MD or one of the following Advanced Practice Providers on your designated Care Team:    Melina Copa, PA-C  Ermalinda Barrios, PA-C    Other Instructions None

## 2020-04-27 ENCOUNTER — Ambulatory Visit
Admission: RE | Admit: 2020-04-27 | Discharge: 2020-04-27 | Disposition: A | Discharge status: Home / Self Care | Payer: BC Managed Care – PPO | Source: Ambulatory Visit | Attending: Obstetrics and Gynecology | Admitting: Obstetrics and Gynecology

## 2020-04-27 DIAGNOSIS — Z1231 Encounter for screening mammogram for malignant neoplasm of breast: Secondary | ICD-10-CM

## 2020-06-18 ENCOUNTER — Telehealth: Payer: Self-pay | Admitting: *Deleted

## 2020-06-18 NOTE — Telephone Encounter (Signed)
   [facility] Pre-operative Risk Assessment    HEARTCARE STAFF: - Please ensure there is not already an duplicate clearance open for this procedure. - Under Visit Info/Reason for Call, type in Other and utilize the format Clearance MM/DD/YY or Clearance TBD. Do not use dashes or single digits. - If request is for dental extraction, please clarify the # of teeth to be extracted.  Request for surgical clearance:  1. What type of surgery is being performed? CERVICAL SELECTIVE NERVE ROOT BLOCK   2. When is this surgery scheduled? 07/10/20   3. What type of clearance is required (medical clearance vs. Pharmacy clearance to hold med vs. Both)? BOTH  4. Are there any medications that need to be held prior to surgery and how long? ELIQUIS x 3 DAYS PRIOR TO PROCEDURE   5. Practice name and name of physician performing surgery? Marisa Sprinkles; MD NOT LISTED   6. What is the office phone number? [phone]   7.   What is the office fax number? [phone]  8.   Anesthesia type (None, local, MAC, general) ? NOT LISTED    Julaine Hua 06/18/2020, 4:36 PM  _________________________________________________________________   (provider comments below)

## 2020-06-19 NOTE — Telephone Encounter (Signed)
Patient with diagnosis of afib on Eliquis for anticoagulation.    Procedure: CERVICAL SELECTIVE NERVE ROOT BLOCK  Date of procedure: 07/10/20  CHA2DS2-VASc Score = 4  This indicates a 4.8% annual risk of stroke. The patient's score is based upon: CHF History: 0 HTN History: 1 Diabetes History: 0 Stroke History: 0 Vascular Disease History: 1 Age Score: 1 Gender Score: 1   CrCl 10mL/min Platelet count 244K (2019)  Per office protocol, patient can hold Eliquis for 3 days prior to procedure.

## 2020-06-19 NOTE — Telephone Encounter (Signed)
Pharmacy, can you please comment on how long patient can hold Eliquis prior to upcoming procedure?  Thank you!

## 2020-06-19 NOTE — Telephone Encounter (Signed)
   Primary Cardiologist: Larae Grooms, MD  Chart reviewed as part of pre-operative protocol coverage. Given past medical history and time since last visit, based on ACC/AHA guidelines, Shannon Aguilar would be at acceptable risk for the planned procedure without further cardiovascular testing.  Per pharmacy recommendations it is okay to hold Eliquis 3 days prior to surgery.   The patient was advised that if she develops new symptoms prior to surgery to contact our office to arrange for a follow-up visit, and she verbalized understanding.  I will route this recommendation to the requesting party via Epic fax function and remove from pre-op pool.  Please call with questions.  Tayte Mcwherter Ninfa Meeker, PA-C 06/19/2020, 10:03 AM

## 2020-07-12 ENCOUNTER — Other Ambulatory Visit: Payer: Self-pay

## 2020-07-12 ENCOUNTER — Other Ambulatory Visit: Payer: Self-pay | Admitting: Interventional Cardiology

## 2020-07-12 ENCOUNTER — Other Ambulatory Visit: Payer: Self-pay | Admitting: Family Medicine

## 2020-07-12 ENCOUNTER — Telehealth: Payer: Self-pay | Admitting: Interventional Cardiology

## 2020-07-12 DIAGNOSIS — R911 Solitary pulmonary nodule: Secondary | ICD-10-CM

## 2020-07-12 NOTE — Telephone Encounter (Signed)
Called and spoke to patient. She will pick up a co-pay savings card.

## 2020-07-12 NOTE — Telephone Encounter (Signed)
Patient calling in regards to MyChart Messages between herself and [place] (11/18)  Patient states that she is not sure if she has the copay savings card or if it's expired. She states the pharmacy told her the prescription would cost around $140 as opposed to the normal $40 - in regards to eloquis. Please call/advise.   Thank you!

## 2020-07-12 NOTE — Telephone Encounter (Signed)
Called and spoke to patient. She will come in and pick up a co-pay savings card.

## 2020-07-12 NOTE — Telephone Encounter (Signed)
Eliquis 5mg  refill request received. Patient is 74 years old, weight-85.1kg, Crea-0.76 on 05/10/2020 via [facility] from [place], [place], and last seen by Dr. Irish Lack on 05/10/2020. Dose is appropriate based on dosing criteria. Will send in refill to requested pharmacy.

## 2020-08-14 ENCOUNTER — Other Ambulatory Visit: Payer: Medicare Other

## 2020-08-16 ENCOUNTER — Ambulatory Visit
Admission: RE | Admit: 2020-08-16 | Discharge: 2020-08-16 | Disposition: A | Discharge status: Home / Self Care | Payer: Medicare Other | Source: Ambulatory Visit | Attending: Family Medicine | Admitting: Family Medicine

## 2020-08-16 DIAGNOSIS — R911 Solitary pulmonary nodule: Secondary | ICD-10-CM

## 2020-08-28 ENCOUNTER — Telehealth: Payer: Self-pay | Admitting: Pharmacist

## 2020-08-28 NOTE — Telephone Encounter (Signed)
Eliquis prior authorization has been approved through 08/28/21. Called pt to let her know, phone # not valid and kept dropping call.

## 2020-08-29 NOTE — Telephone Encounter (Signed)
Called pt again, she is aware of Eliquis approval and was appreciative of assistance.

## 2020-09-14 DIAGNOSIS — I480 Paroxysmal atrial fibrillation: Secondary | ICD-10-CM | POA: Diagnosis not present

## 2020-09-14 DIAGNOSIS — N183 Chronic kidney disease, stage 3 unspecified: Secondary | ICD-10-CM | POA: Diagnosis not present

## 2020-09-14 DIAGNOSIS — I10 Essential (primary) hypertension: Secondary | ICD-10-CM | POA: Diagnosis not present

## 2020-09-14 DIAGNOSIS — E1169 Type 2 diabetes mellitus with other specified complication: Secondary | ICD-10-CM | POA: Diagnosis not present

## 2020-09-14 DIAGNOSIS — J45909 Unspecified asthma, uncomplicated: Secondary | ICD-10-CM | POA: Diagnosis not present

## 2020-09-14 DIAGNOSIS — E782 Mixed hyperlipidemia: Secondary | ICD-10-CM | POA: Diagnosis not present

## 2020-10-09 DIAGNOSIS — J45909 Unspecified asthma, uncomplicated: Secondary | ICD-10-CM | POA: Diagnosis not present

## 2020-10-09 DIAGNOSIS — N1831 Chronic kidney disease, stage 3a: Secondary | ICD-10-CM | POA: Diagnosis not present

## 2020-10-09 DIAGNOSIS — E1169 Type 2 diabetes mellitus with other specified complication: Secondary | ICD-10-CM | POA: Diagnosis not present

## 2020-10-09 DIAGNOSIS — I10 Essential (primary) hypertension: Secondary | ICD-10-CM | POA: Diagnosis not present

## 2020-10-09 DIAGNOSIS — I480 Paroxysmal atrial fibrillation: Secondary | ICD-10-CM | POA: Diagnosis not present

## 2020-10-09 DIAGNOSIS — E782 Mixed hyperlipidemia: Secondary | ICD-10-CM | POA: Diagnosis not present

## 2020-10-12 DIAGNOSIS — M19072 Primary osteoarthritis, left ankle and foot: Secondary | ICD-10-CM | POA: Diagnosis not present

## 2020-10-12 DIAGNOSIS — M19071 Primary osteoarthritis, right ankle and foot: Secondary | ICD-10-CM | POA: Diagnosis not present

## 2020-10-31 DIAGNOSIS — Z85828 Personal history of other malignant neoplasm of skin: Secondary | ICD-10-CM | POA: Diagnosis not present

## 2020-10-31 DIAGNOSIS — L814 Other melanin hyperpigmentation: Secondary | ICD-10-CM | POA: Diagnosis not present

## 2020-10-31 DIAGNOSIS — L570 Actinic keratosis: Secondary | ICD-10-CM | POA: Diagnosis not present

## 2020-10-31 DIAGNOSIS — D225 Melanocytic nevi of trunk: Secondary | ICD-10-CM | POA: Diagnosis not present

## 2020-10-31 DIAGNOSIS — L821 Other seborrheic keratosis: Secondary | ICD-10-CM | POA: Diagnosis not present

## 2020-11-02 DIAGNOSIS — J45909 Unspecified asthma, uncomplicated: Secondary | ICD-10-CM | POA: Diagnosis not present

## 2020-11-02 DIAGNOSIS — E1169 Type 2 diabetes mellitus with other specified complication: Secondary | ICD-10-CM | POA: Diagnosis not present

## 2020-11-02 DIAGNOSIS — G4733 Obstructive sleep apnea (adult) (pediatric): Secondary | ICD-10-CM | POA: Diagnosis not present

## 2020-11-02 DIAGNOSIS — I10 Essential (primary) hypertension: Secondary | ICD-10-CM | POA: Diagnosis not present

## 2020-11-02 DIAGNOSIS — E782 Mixed hyperlipidemia: Secondary | ICD-10-CM | POA: Diagnosis not present

## 2020-11-02 DIAGNOSIS — I480 Paroxysmal atrial fibrillation: Secondary | ICD-10-CM | POA: Diagnosis not present

## 2020-11-02 DIAGNOSIS — N183 Chronic kidney disease, stage 3 unspecified: Secondary | ICD-10-CM | POA: Diagnosis not present

## 2020-11-02 DIAGNOSIS — N1831 Chronic kidney disease, stage 3a: Secondary | ICD-10-CM | POA: Diagnosis not present

## 2021-01-03 ENCOUNTER — Other Ambulatory Visit: Payer: Self-pay | Admitting: Interventional Cardiology

## 2021-01-03 NOTE — Telephone Encounter (Signed)
Prescription refill request for Eliquis received. Indication: afib  Last office visit: Varan[place], 04/26/2020 Scr: 0.76, 05/10/2020 Age: 75 yo  Weight: 85.1 kg   Pt is on the correct dose of Eliquis per dosing criteria, prescription refill sent for Eliquis 5mg  BID.

## 2021-01-10 DIAGNOSIS — D6869 Other thrombophilia: Secondary | ICD-10-CM | POA: Diagnosis not present

## 2021-01-10 DIAGNOSIS — I10 Essential (primary) hypertension: Secondary | ICD-10-CM | POA: Diagnosis not present

## 2021-01-10 DIAGNOSIS — Z1389 Encounter for screening for other disorder: Secondary | ICD-10-CM | POA: Diagnosis not present

## 2021-01-10 DIAGNOSIS — E782 Mixed hyperlipidemia: Secondary | ICD-10-CM | POA: Diagnosis not present

## 2021-01-10 DIAGNOSIS — J45909 Unspecified asthma, uncomplicated: Secondary | ICD-10-CM | POA: Diagnosis not present

## 2021-01-10 DIAGNOSIS — N1831 Chronic kidney disease, stage 3a: Secondary | ICD-10-CM | POA: Diagnosis not present

## 2021-01-10 DIAGNOSIS — E1169 Type 2 diabetes mellitus with other specified complication: Secondary | ICD-10-CM | POA: Diagnosis not present

## 2021-01-10 DIAGNOSIS — I480 Paroxysmal atrial fibrillation: Secondary | ICD-10-CM | POA: Diagnosis not present

## 2021-01-10 DIAGNOSIS — G4730 Sleep apnea, unspecified: Secondary | ICD-10-CM | POA: Diagnosis not present

## 2021-01-10 DIAGNOSIS — M5030 Other cervical disc degeneration, unspecified cervical region: Secondary | ICD-10-CM | POA: Diagnosis not present

## 2021-01-10 DIAGNOSIS — Z0000 Encounter for general adult medical examination without abnormal findings: Secondary | ICD-10-CM | POA: Diagnosis not present

## 2021-01-16 DIAGNOSIS — N1831 Chronic kidney disease, stage 3a: Secondary | ICD-10-CM | POA: Diagnosis not present

## 2021-01-16 DIAGNOSIS — J45909 Unspecified asthma, uncomplicated: Secondary | ICD-10-CM | POA: Diagnosis not present

## 2021-01-16 DIAGNOSIS — N183 Chronic kidney disease, stage 3 unspecified: Secondary | ICD-10-CM | POA: Diagnosis not present

## 2021-01-16 DIAGNOSIS — I480 Paroxysmal atrial fibrillation: Secondary | ICD-10-CM | POA: Diagnosis not present

## 2021-01-16 DIAGNOSIS — E1169 Type 2 diabetes mellitus with other specified complication: Secondary | ICD-10-CM | POA: Diagnosis not present

## 2021-01-16 DIAGNOSIS — E782 Mixed hyperlipidemia: Secondary | ICD-10-CM | POA: Diagnosis not present

## 2021-01-16 DIAGNOSIS — I10 Essential (primary) hypertension: Secondary | ICD-10-CM | POA: Diagnosis not present

## 2021-02-07 DIAGNOSIS — G4733 Obstructive sleep apnea (adult) (pediatric): Secondary | ICD-10-CM | POA: Diagnosis not present

## 2021-02-21 DIAGNOSIS — I480 Paroxysmal atrial fibrillation: Secondary | ICD-10-CM | POA: Diagnosis not present

## 2021-02-21 DIAGNOSIS — E1169 Type 2 diabetes mellitus with other specified complication: Secondary | ICD-10-CM | POA: Diagnosis not present

## 2021-02-21 DIAGNOSIS — J45909 Unspecified asthma, uncomplicated: Secondary | ICD-10-CM | POA: Diagnosis not present

## 2021-02-21 DIAGNOSIS — E782 Mixed hyperlipidemia: Secondary | ICD-10-CM | POA: Diagnosis not present

## 2021-02-21 DIAGNOSIS — N1831 Chronic kidney disease, stage 3a: Secondary | ICD-10-CM | POA: Diagnosis not present

## 2021-02-21 DIAGNOSIS — I10 Essential (primary) hypertension: Secondary | ICD-10-CM | POA: Diagnosis not present

## 2021-03-04 DIAGNOSIS — N3001 Acute cystitis with hematuria: Secondary | ICD-10-CM | POA: Diagnosis not present

## 2021-04-01 DIAGNOSIS — E1169 Type 2 diabetes mellitus with other specified complication: Secondary | ICD-10-CM | POA: Diagnosis not present

## 2021-04-01 DIAGNOSIS — I10 Essential (primary) hypertension: Secondary | ICD-10-CM | POA: Diagnosis not present

## 2021-04-01 DIAGNOSIS — I480 Paroxysmal atrial fibrillation: Secondary | ICD-10-CM | POA: Diagnosis not present

## 2021-04-01 DIAGNOSIS — N1831 Chronic kidney disease, stage 3a: Secondary | ICD-10-CM | POA: Diagnosis not present

## 2021-04-01 DIAGNOSIS — J45909 Unspecified asthma, uncomplicated: Secondary | ICD-10-CM | POA: Diagnosis not present

## 2021-04-01 DIAGNOSIS — E782 Mixed hyperlipidemia: Secondary | ICD-10-CM | POA: Diagnosis not present

## 2021-04-22 DIAGNOSIS — H04123 Dry eye syndrome of bilateral lacrimal glands: Secondary | ICD-10-CM | POA: Diagnosis not present

## 2021-04-22 DIAGNOSIS — Z961 Presence of intraocular lens: Secondary | ICD-10-CM | POA: Diagnosis not present

## 2021-04-22 DIAGNOSIS — E119 Type 2 diabetes mellitus without complications: Secondary | ICD-10-CM | POA: Diagnosis not present

## 2021-05-02 DIAGNOSIS — M549 Dorsalgia, unspecified: Secondary | ICD-10-CM | POA: Diagnosis not present

## 2021-05-06 DIAGNOSIS — S46212A Strain of muscle, fascia and tendon of other parts of biceps, left arm, initial encounter: Secondary | ICD-10-CM | POA: Diagnosis not present

## 2021-05-06 DIAGNOSIS — I10 Essential (primary) hypertension: Secondary | ICD-10-CM | POA: Diagnosis not present

## 2021-05-06 DIAGNOSIS — R109 Unspecified abdominal pain: Secondary | ICD-10-CM | POA: Diagnosis not present

## 2021-06-10 ENCOUNTER — Other Ambulatory Visit: Payer: Self-pay | Admitting: Obstetrics and Gynecology

## 2021-06-10 DIAGNOSIS — Z1231 Encounter for screening mammogram for malignant neoplasm of breast: Secondary | ICD-10-CM

## 2021-06-11 ENCOUNTER — Ambulatory Visit
Admission: RE | Admit: 2021-06-11 | Discharge: 2021-06-11 | Disposition: A | Discharge status: Home / Self Care | Payer: Medicare Other | Source: Ambulatory Visit | Attending: Obstetrics and Gynecology | Admitting: Obstetrics and Gynecology

## 2021-06-11 ENCOUNTER — Other Ambulatory Visit: Payer: Self-pay

## 2021-06-11 DIAGNOSIS — Z1231 Encounter for screening mammogram for malignant neoplasm of breast: Secondary | ICD-10-CM

## 2021-06-23 ENCOUNTER — Other Ambulatory Visit: Payer: Self-pay | Admitting: Interventional Cardiology

## 2021-06-23 DIAGNOSIS — I480 Paroxysmal atrial fibrillation: Secondary | ICD-10-CM

## 2021-06-24 NOTE — Telephone Encounter (Signed)
Eliquis 5mg  refill request received. Patient is 75 years old, weight-85.1kg, Crea-0.88 on 01/10/21 via [facility] from [place], [place], and last seen by Dr. Irish Lack on 04/26/2020 & pending appt on 07/09/2021. Dose is appropriate based on dosing criteria. Will send in refill to requested pharmacy.

## 2021-06-27 DIAGNOSIS — Z23 Encounter for immunization: Secondary | ICD-10-CM | POA: Diagnosis not present

## 2021-07-01 DIAGNOSIS — J45909 Unspecified asthma, uncomplicated: Secondary | ICD-10-CM | POA: Diagnosis not present

## 2021-07-01 DIAGNOSIS — D6869 Other thrombophilia: Secondary | ICD-10-CM | POA: Diagnosis not present

## 2021-07-01 DIAGNOSIS — M5030 Other cervical disc degeneration, unspecified cervical region: Secondary | ICD-10-CM | POA: Diagnosis not present

## 2021-07-01 DIAGNOSIS — N1831 Chronic kidney disease, stage 3a: Secondary | ICD-10-CM | POA: Diagnosis not present

## 2021-07-01 DIAGNOSIS — E782 Mixed hyperlipidemia: Secondary | ICD-10-CM | POA: Diagnosis not present

## 2021-07-01 DIAGNOSIS — G4730 Sleep apnea, unspecified: Secondary | ICD-10-CM | POA: Diagnosis not present

## 2021-07-01 DIAGNOSIS — I10 Essential (primary) hypertension: Secondary | ICD-10-CM | POA: Diagnosis not present

## 2021-07-01 DIAGNOSIS — I480 Paroxysmal atrial fibrillation: Secondary | ICD-10-CM | POA: Diagnosis not present

## 2021-07-01 DIAGNOSIS — E1169 Type 2 diabetes mellitus with other specified complication: Secondary | ICD-10-CM | POA: Diagnosis not present

## 2021-07-01 DIAGNOSIS — M25512 Pain in left shoulder: Secondary | ICD-10-CM | POA: Diagnosis not present

## 2021-07-01 DIAGNOSIS — J309 Allergic rhinitis, unspecified: Secondary | ICD-10-CM | POA: Diagnosis not present

## 2021-07-08 NOTE — Progress Notes (Signed)
Cardiology Office Note   Date:  07/09/2021   ID:  Shannon Aguilar [place], [place] 05-Nov-1945, MRN [id number]  PCP:  Shannon Contras, MD    No chief complaint on file.  PAF  Wt Readings from Last 3 Encounters:  07/09/21 206 lb (93.4 kg)  04/26/20 187 lb 9.6 oz (85.1 kg)  09/20/19 233 lb (105.7 kg)       History of Present Illness: Shannon Aguilar is a 75 y.o. female  Who had TKR in 12/17.  She has had palpitations intermittently prior to this and had been diagnosed with PAF in 2006.     Her palpitations typically occured at night.  The worst episode was on 05/17/16.  It lasted 30 minutes.  HR up to 149 once after a cold, but it resolved on its own before she got to urgent care.     In 2006, she had a cardiac eval.  She had PAF at that time.  Echo and stress test were done at that time.  W/u was negative.  That occurred after a trip to [place].   Further eval showed PAF in 12/17.  She has been on anticoagulation since that time.   In December 2017, she had knee replacement.   SHe has a brother with AFib, who had an ablation.    In 2019, She went to the ER once after eating beet chips and her stool was red- but this was found not to be bleeding.     In 2021, BP decreased as she lost 40 lbs, through [place].  Home readings were in the 110-120 range.  She cut down to metoprolol 12.5 BID.    She hurt her left shoulder in September 2022.  Got worse with walking, so she has cut back on walking.  She has left arm pain with certain movements.    Denies : Chest pain. Dizziness. Leg edema. Nitroglycerin use. Orthopnea. Palpitations. Paroxysmal nocturnal dyspnea. Shortness of breath. Syncope.      Past Medical History:  Diagnosis Date   Anxiety    Arthritis 02-25-12   arthritis-neck, fingers, hips, knees   Chronic kidney disease 02-25-12   kidney stones- litho's x2   Depression    Dysrhythmia 2006   hx. A. Fib, no recent problems -rate control-NSR  at present, 2 or 3  events 2017   History of blood transfusion 1978 and 1981   History of skin cancer    Hypertension    Pneumonia as child   Pre-diabetes    Rash    red area chest from heart monitor on chest and abdomen areas healing   Sleep apnea 02-25-12   uses cpap since 2000 setting between 9 and 13    Past Surgical History:  Procedure Laterality Date   [place] and 1981   x2    [place]  02-25-12   abnormal vaginal bleeding- ? 5 yrs ago   KNEE ARTHROSCOPY  03/03/2012   Procedure: ARTHROSCOPY KNEE;  Surgeon: Gearlean Alf, MD;  Location: [facility];  Service: Orthopedics;  Laterality: Right;  Right Knee Arthroscopy, medial meniscectomy and chrondroplasty   LITHOTRIPSY  02-25-12   x2    TONSILLECTOMY  02-25-12   child   TOTAL KNEE ARTHROPLASTY Right 11/19/2015   Procedure: TOTAL KNEE ARTHROPLASTY;  Surgeon: Gaynelle Arabian, MD;  Location: [facility];  Service: Orthopedics;  Laterality: Right;   TOTAL KNEE ARTHROPLASTY Left 07/28/2016   Procedure: LEFT TOTAL KNEE  ARTHROPLASTY;  Surgeon: Gaynelle Arabian, MD;  Location: [facility];  Service: Orthopedics;  Laterality: Left;     Current Outpatient Medications  Medication Sig Dispense Refill   acetaminophen (TYLENOL) 500 MG tablet Take 1,000 mg by mouth every 6 (six) hours as needed for mild pain.     amLODipine (NORVASC) 2.5 MG tablet Take 2.5 mg by mouth daily.      citalopram (CELEXA) 20 MG tablet Take 20 mg by mouth every evening.      ELIQUIS 5 MG TABS tablet TAKE ONE TABLET BY MOUTH TWICE DAILY 180 tablet 1   metoprolol tartrate (LOPRESSOR) 25 MG tablet Take 12.5 mg by mouth 2 (two) times daily.     Polyethyl Glycol-Propyl Glycol (SYSTANE OP) Apply 1 drop to eye 2 (two) times daily as needed (for dry eyes). Dry eyes     triamterene-hydrochlorothiazide (MAXZIDE) 75-50 MG tablet Take 0.5 tablets by mouth daily. 45 tablet 3   albuterol (PROVENTIL HFA;VENTOLIN HFA) 108 (90 Base) MCG/ACT inhaler Inhale 2 puffs into the lungs every 6  (six) hours as needed for wheezing or shortness of breath. (Patient not taking: Reported on 07/09/2021) 1 Inhaler 5   No current facility-administered medications for this visit.    Allergies:   Milk-related compounds, Penicillins, Atorvastatin, and Pravastatin    Social History:  The patient  reports that she quit smoking about 30 years ago. Her smoking use included cigarettes. She has a 20.00 pack-year smoking history. She has never used smokeless tobacco. She reports current alcohol use. She reports that she does not use drugs.   Family History:  The patient's family history includes Arthritis in her mother; Diabetes in her father; Heart attack in her father and mother; Heart disease in her father; Hypercholesterolemia in her sister; Hypertension in her brother, brother, and mother; Hypothyroidism in her sister; Prostate cancer in her father.    ROS:  Please see the history of present illness.   Otherwise, review of systems are positive for left shoulder pain.   All other systems are reviewed and negative.    PHYSICAL EXAM: VS:  BP 130/66   Pulse (!) 59   Ht 5' 5.5" (1.664 m)   Wt 206 lb (93.4 kg)   SpO2 98%   BMI 33.76 kg/m  , BMI Body mass index is 33.76 kg/m. GEN: Well nourished, well developed, in no acute distress HEENT: normal Neck: no JVD, carotid bruits, or masses Cardiac: RRR; no murmurs, rubs, or gallops,no edema  Respiratory:  clear to auscultation bilaterally, normal work of breathing GI: soft, nontender, nondistended, + BS MS: no deformity or atrophy Skin: warm and dry, no rash Neuro:  Strength and sensation are intact Psych: euthymic mood, full affect   EKG:   The ekg ordered today demonstrates NSR, no ST changes   Recent Labs: No results found for requested labs within last 8760 hours.   Lipid Panel No results found for: CHOL, TRIG, HDL, CHOLHDL, VLDL, LDLCALC, LDLDIRECT   Other studies Reviewed: Additional studies/ records that were reviewed today  with results demonstrating: labs reviewed.   ASSESSMENT AND PLAN:  AFib: No palpitations.  No bleeding issues.  Eliquis for stroke prevention.   Coronary calcification: No exertional chest pain.  CT chest noted this as well. Aortic atherosclerosis: LDL 58. Taking Zypitamag 2 mg daily- pitavastatin.  Intolerant of lipitor and crestor.  Prediabetes: Whole food, plant-based diet.  Increase fiber intake. Hypertension: Low-salt diet.   Current medicines are reviewed at length with the patient today.  The patient concerns regarding her medicines were addressed.  The following changes have been made:  No change  Labs/ tests ordered today include:  No orders of the defined types were placed in this encounter.   Recommend 150 minutes/week of aerobic exercise Low fat, low carb, high fiber diet recommended  Disposition:   FU in 1 year   Signed, Larae Grooms, MD  07/09/2021 8:50 AM    [facility] [address], [place], [place]  [address] Phone: [phone]; Fax: [phone]

## 2021-07-09 ENCOUNTER — Encounter: Payer: Self-pay | Admitting: Interventional Cardiology

## 2021-07-09 ENCOUNTER — Ambulatory Visit (INDEPENDENT_AMBULATORY_CARE_PROVIDER_SITE_OTHER): Payer: Medicare Other | Admitting: Interventional Cardiology

## 2021-07-09 ENCOUNTER — Other Ambulatory Visit: Payer: Self-pay

## 2021-07-09 VITALS — BP 130/66 | HR 59 | Ht 65.5 in | Wt 206.0 lb

## 2021-07-09 DIAGNOSIS — I2510 Atherosclerotic heart disease of native coronary artery without angina pectoris: Secondary | ICD-10-CM

## 2021-07-09 DIAGNOSIS — I700 Atherosclerosis of aorta: Secondary | ICD-10-CM | POA: Diagnosis not present

## 2021-07-09 DIAGNOSIS — R7303 Prediabetes: Secondary | ICD-10-CM | POA: Diagnosis not present

## 2021-07-09 DIAGNOSIS — I480 Paroxysmal atrial fibrillation: Secondary | ICD-10-CM

## 2021-07-09 NOTE — Patient Instructions (Signed)

## 2021-07-11 DIAGNOSIS — I10 Essential (primary) hypertension: Secondary | ICD-10-CM | POA: Diagnosis not present

## 2021-07-11 DIAGNOSIS — N1831 Chronic kidney disease, stage 3a: Secondary | ICD-10-CM | POA: Diagnosis not present

## 2021-07-11 DIAGNOSIS — I480 Paroxysmal atrial fibrillation: Secondary | ICD-10-CM | POA: Diagnosis not present

## 2021-07-11 DIAGNOSIS — E1169 Type 2 diabetes mellitus with other specified complication: Secondary | ICD-10-CM | POA: Diagnosis not present

## 2021-07-11 DIAGNOSIS — J45909 Unspecified asthma, uncomplicated: Secondary | ICD-10-CM | POA: Diagnosis not present

## 2021-07-11 DIAGNOSIS — E782 Mixed hyperlipidemia: Secondary | ICD-10-CM | POA: Diagnosis not present

## 2021-07-15 ENCOUNTER — Ambulatory Visit
Admission: RE | Admit: 2021-07-15 | Discharge: 2021-07-15 | Disposition: A | Discharge status: Home / Self Care | Payer: Medicare Other | Source: Ambulatory Visit | Attending: Sports Medicine | Admitting: Sports Medicine

## 2021-07-15 ENCOUNTER — Other Ambulatory Visit: Payer: Self-pay | Admitting: Sports Medicine

## 2021-07-15 ENCOUNTER — Other Ambulatory Visit: Payer: Self-pay

## 2021-07-15 DIAGNOSIS — M25512 Pain in left shoulder: Secondary | ICD-10-CM | POA: Diagnosis not present

## 2021-08-29 DIAGNOSIS — G4733 Obstructive sleep apnea (adult) (pediatric): Secondary | ICD-10-CM | POA: Diagnosis not present

## 2021-09-11 DIAGNOSIS — M47812 Spondylosis without myelopathy or radiculopathy, cervical region: Secondary | ICD-10-CM | POA: Diagnosis not present

## 2021-09-25 DIAGNOSIS — E1169 Type 2 diabetes mellitus with other specified complication: Secondary | ICD-10-CM | POA: Diagnosis not present

## 2021-09-25 DIAGNOSIS — E782 Mixed hyperlipidemia: Secondary | ICD-10-CM | POA: Diagnosis not present

## 2021-09-25 DIAGNOSIS — I10 Essential (primary) hypertension: Secondary | ICD-10-CM | POA: Diagnosis not present

## 2021-10-10 DIAGNOSIS — M542 Cervicalgia: Secondary | ICD-10-CM | POA: Diagnosis not present

## 2021-10-10 DIAGNOSIS — M47812 Spondylosis without myelopathy or radiculopathy, cervical region: Secondary | ICD-10-CM | POA: Diagnosis not present

## 2021-10-31 DIAGNOSIS — L821 Other seborrheic keratosis: Secondary | ICD-10-CM | POA: Diagnosis not present

## 2021-10-31 DIAGNOSIS — D2261 Melanocytic nevi of right upper limb, including shoulder: Secondary | ICD-10-CM | POA: Diagnosis not present

## 2021-10-31 DIAGNOSIS — C44729 Squamous cell carcinoma of skin of left lower limb, including hip: Secondary | ICD-10-CM | POA: Diagnosis not present

## 2021-10-31 DIAGNOSIS — L570 Actinic keratosis: Secondary | ICD-10-CM | POA: Diagnosis not present

## 2021-10-31 DIAGNOSIS — Z85828 Personal history of other malignant neoplasm of skin: Secondary | ICD-10-CM | POA: Diagnosis not present

## 2021-10-31 DIAGNOSIS — L814 Other melanin hyperpigmentation: Secondary | ICD-10-CM | POA: Diagnosis not present

## 2021-10-31 DIAGNOSIS — D224 Melanocytic nevi of scalp and neck: Secondary | ICD-10-CM | POA: Diagnosis not present

## 2021-11-28 DIAGNOSIS — I10 Essential (primary) hypertension: Secondary | ICD-10-CM | POA: Diagnosis not present

## 2021-11-28 DIAGNOSIS — I480 Paroxysmal atrial fibrillation: Secondary | ICD-10-CM | POA: Diagnosis not present

## 2021-12-19 ENCOUNTER — Other Ambulatory Visit: Payer: Self-pay | Admitting: Interventional Cardiology

## 2021-12-19 DIAGNOSIS — I480 Paroxysmal atrial fibrillation: Secondary | ICD-10-CM

## 2021-12-19 NOTE — Telephone Encounter (Signed)
Prescription refill request for Eliquis received. ?Indication: Afib ?Last office visit: 07/09/21 Irish Lack)  ?Scr: 0.80 (07/01/21) ?Age: 76 ?Weight: 93.4kg ? ?Appropriate dose and refill sent to requested pharmacy.  ?

## 2022-01-08 DIAGNOSIS — G441 Vascular headache, not elsewhere classified: Secondary | ICD-10-CM | POA: Diagnosis not present

## 2022-02-17 DIAGNOSIS — I10 Essential (primary) hypertension: Secondary | ICD-10-CM | POA: Diagnosis not present

## 2022-02-17 DIAGNOSIS — E782 Mixed hyperlipidemia: Secondary | ICD-10-CM | POA: Diagnosis not present

## 2022-02-17 DIAGNOSIS — R053 Chronic cough: Secondary | ICD-10-CM | POA: Diagnosis not present

## 2022-02-17 DIAGNOSIS — Z0000 Encounter for general adult medical examination without abnormal findings: Secondary | ICD-10-CM | POA: Diagnosis not present

## 2022-02-17 DIAGNOSIS — I480 Paroxysmal atrial fibrillation: Secondary | ICD-10-CM | POA: Diagnosis not present

## 2022-02-17 DIAGNOSIS — Z87898 Personal history of other specified conditions: Secondary | ICD-10-CM | POA: Diagnosis not present

## 2022-02-17 DIAGNOSIS — E1169 Type 2 diabetes mellitus with other specified complication: Secondary | ICD-10-CM | POA: Diagnosis not present

## 2022-02-17 DIAGNOSIS — N1831 Chronic kidney disease, stage 3a: Secondary | ICD-10-CM | POA: Diagnosis not present

## 2022-04-22 DIAGNOSIS — N390 Urinary tract infection, site not specified: Secondary | ICD-10-CM | POA: Diagnosis not present

## 2022-04-24 DIAGNOSIS — H52203 Unspecified astigmatism, bilateral: Secondary | ICD-10-CM | POA: Diagnosis not present

## 2022-04-24 DIAGNOSIS — E119 Type 2 diabetes mellitus without complications: Secondary | ICD-10-CM | POA: Diagnosis not present

## 2022-04-24 DIAGNOSIS — Z961 Presence of intraocular lens: Secondary | ICD-10-CM | POA: Diagnosis not present

## 2022-04-24 DIAGNOSIS — H04123 Dry eye syndrome of bilateral lacrimal glands: Secondary | ICD-10-CM | POA: Diagnosis not present

## 2022-05-05 DIAGNOSIS — N390 Urinary tract infection, site not specified: Secondary | ICD-10-CM | POA: Diagnosis not present

## 2022-06-21 ENCOUNTER — Other Ambulatory Visit: Payer: Self-pay | Admitting: Interventional Cardiology

## 2022-06-21 DIAGNOSIS — I480 Paroxysmal atrial fibrillation: Secondary | ICD-10-CM

## 2022-06-23 NOTE — Telephone Encounter (Signed)
Prescription refill request for Eliquis received. Indication: PAF Last office visit:  07/09/21  Lendell Caprice MD (appt 07/07/22) Scr: 0.93 on 02/17/22 Age: 76 Weight: 93.4kg  Based on above findings Eliquis '5mg'$  twice daily is the appropriate dose.  Refill approved.

## 2022-06-30 DIAGNOSIS — M25512 Pain in left shoulder: Secondary | ICD-10-CM | POA: Diagnosis not present

## 2022-07-07 ENCOUNTER — Telehealth: Payer: Self-pay | Admitting: Cardiology

## 2022-07-07 ENCOUNTER — Encounter: Payer: Self-pay | Admitting: Interventional Cardiology

## 2022-07-07 ENCOUNTER — Other Ambulatory Visit: Payer: Self-pay

## 2022-07-07 ENCOUNTER — Encounter (HOSPITAL_BASED_OUTPATIENT_CLINIC_OR_DEPARTMENT_OTHER): Payer: Self-pay

## 2022-07-07 ENCOUNTER — Emergency Department (HOSPITAL_BASED_OUTPATIENT_CLINIC_OR_DEPARTMENT_OTHER): Payer: Medicare Other | Admitting: Radiology

## 2022-07-07 ENCOUNTER — Ambulatory Visit: Payer: Medicare Other | Attending: Interventional Cardiology | Admitting: Interventional Cardiology

## 2022-07-07 VITALS — BP 124/58 | HR 64 | Ht 64.75 in | Wt 229.0 lb

## 2022-07-07 DIAGNOSIS — I480 Paroxysmal atrial fibrillation: Secondary | ICD-10-CM | POA: Insufficient documentation

## 2022-07-07 DIAGNOSIS — Z7901 Long term (current) use of anticoagulants: Secondary | ICD-10-CM | POA: Diagnosis not present

## 2022-07-07 DIAGNOSIS — D72829 Elevated white blood cell count, unspecified: Secondary | ICD-10-CM | POA: Insufficient documentation

## 2022-07-07 DIAGNOSIS — R7303 Prediabetes: Secondary | ICD-10-CM

## 2022-07-07 DIAGNOSIS — Z79899 Other long term (current) drug therapy: Secondary | ICD-10-CM | POA: Insufficient documentation

## 2022-07-07 DIAGNOSIS — I700 Atherosclerosis of aorta: Secondary | ICD-10-CM

## 2022-07-07 DIAGNOSIS — I2510 Atherosclerotic heart disease of native coronary artery without angina pectoris: Secondary | ICD-10-CM | POA: Diagnosis not present

## 2022-07-07 DIAGNOSIS — R000 Tachycardia, unspecified: Secondary | ICD-10-CM | POA: Diagnosis not present

## 2022-07-07 LAB — CBC
HCT: 41.9 % (ref 36.0–46.0)
Hemoglobin: 14 g/dL (ref 12.0–15.0)
MCH: 30.4 pg (ref 26.0–34.0)
MCHC: 33.4 g/dL (ref 30.0–36.0)
MCV: 91.1 fL (ref 80.0–100.0)
Platelets: 273 10*3/uL (ref 150–400)
RBC: 4.6 MIL/uL (ref 3.87–5.11)
RDW: 12.7 % (ref 11.5–15.5)
WBC: 12.9 10*3/uL — ABNORMAL HIGH (ref 4.0–10.5)
nRBC: 0 % (ref 0.0–0.2)

## 2022-07-07 LAB — BASIC METABOLIC PANEL
Anion gap: 11 (ref 5–15)
BUN: 31 mg/dL — ABNORMAL HIGH (ref 8–23)
CO2: 26 mmol/L (ref 22–32)
Calcium: 9.6 mg/dL (ref 8.9–10.3)
Chloride: 100 mmol/L (ref 98–111)
Creatinine, Ser: 0.79 mg/dL (ref 0.44–1.00)
GFR, Estimated: >60 mL/min (ref >60)
Glucose, Bld: 163 mg/dL — ABNORMAL HIGH (ref 70–99)
Potassium: 3.9 mmol/L (ref 3.5–5.1)
Sodium: 137 mmol/L (ref 135–145)

## 2022-07-07 NOTE — Telephone Encounter (Signed)
Pt called due to recurrent atrial fib.  She was fine until after dinner and did not feel well, no chst pain or SOB but some fullness in ear.  Her apple watch with HR 150 it has slowed to 120-10 currently but irregular. She has taken her eliquis and instead of half of lopressor (12.5 ) she took 25 just now.  She will give it 1-2 hours and if not better to go ER   Triage please check pt on 07/08/22

## 2022-07-07 NOTE — Patient Instructions (Signed)
Medication Instructions:  Your physician recommends that you continue on your current medications as directed. Please refer to the Current Medication list given to you today.  *If you need a refill on your cardiac medications before your next appointment, please call your pharmacy*   Lab Work: none If you have labs (blood work) drawn today and your tests are completely normal, you will receive your results only by: MyChart Message (if you have MyChart) OR A paper copy in the mail If you have any lab test that is abnormal or we need to change your treatment, we will call you to review the results.   Testing/Procedures: none   Follow-Up: At [facility], you and your health needs are our priority.  As part of our continuing mission to provide you with exceptional heart care, we have created designated Provider Care Teams.  These Care Teams include your primary Cardiologist (physician) and Advanced Practice Providers (APPs -  Physician Assistants and Nurse Practitioners) who all work together to provide you with the care you need, when you need it.  We recommend signing up for the patient portal called "MyChart".  Sign up information is provided on this After Visit Summary.  MyChart is used to connect with patients for Virtual Visits (Telemedicine).  Patients are able to view lab/test results, encounter notes, upcoming appointments, etc.  Non-urgent messages can be sent to your provider as well.   To learn more about what you can do with MyChart, go to [email or web address].    Your next appointment:   12 month(s)  The format for your next appointment:   In Person  Provider:   Jayadeep Varanasi, MD     Other Instructions    Important Information About Sugar       

## 2022-07-07 NOTE — Progress Notes (Signed)
Cardiology Office Note   Date:  07/07/2022   ID:  Shannon Aguilar, [place] 1945/11/25, MRN [id number]  PCP:  Antony Contras, MD    No chief complaint on file.  Coronary artery calcification, PAF  Wt Readings from Last 3 Encounters:  07/07/22 229 lb (103.9 kg)  07/09/21 206 lb (93.4 kg)  04/26/20 187 lb 9.6 oz (85.1 kg)       History of Present Illness: Shannon Aguilar is a 76 y.o. female   Who had TKR in 12/17.  She has had palpitations intermittently prior to this and had been diagnosed with PAF in 2006.     Her palpitations typically occured at night.  The worst episode was on 05/17/16.  It lasted 30 minutes.  HR up to 149 once after a cold, but it resolved on its own before she got to urgent care.     In 2006, she had a cardiac eval.  She had PAF at that time.  Echo and stress test were done at that time.  W/u was negative.  That occurred after a trip to [place].   Further eval showed PAF in 12/17.  She has been on anticoagulation since that time.   In December 2017, she had knee replacement.   SHe has a brother with AFib, who had an ablation.    In 2019, She went to the ER once after eating beet chips and her stool was red- but this was found not to be bleeding.    2020 CTA showed: " Mild nonobstructive CAD, CADRADS = 1.   2. Coronary calcium score of 19. This was 44th percentile for age and sex matched control.   3. Normal coronary origin with right dominance."   In 2021, BP decreased as she lost 40 lbs, through [place].  Home readings were in the 110-120 range.  She cut down to metoprolol 12.5 BID.    CT chest: "Cardiovascular: Heart is normal size. Minimal calcified plaque over the left anterior descending and right coronary arteries. Thoracic aorta is normal caliber. Minimal calcified plaque over the thoracic aorta. Remaining vascular structures are unremarkable."   She hurt her left shoulder in September 2022.  Got worse with walking,  so she has cut back on walking.  She has left arm pain with certain movements.   Husband had CABG in 2023.   In 2023, she got a few steroid shots.  She just started back to walking recently.    Denies : Chest pain. Dizziness. Leg edema. Nitroglycerin use. Orthopnea. Palpitations. Paroxysmal nocturnal dyspnea.  Syncope.     Past Medical History:  Diagnosis Date   Anxiety    Arthritis 02-25-12   arthritis-neck, fingers, hips, knees   Chronic kidney disease 02-25-12   kidney stones- litho's x2   Depression    Dysrhythmia 2006   hx. A. Fib, no recent problems -rate control-NSR  at present, 2 or 3 events 2017   History of blood transfusion 1978 and 1981   History of skin cancer    Hypertension    Pneumonia as child   Pre-diabetes    Rash    red area chest from heart monitor on chest and abdomen areas healing   Sleep apnea 02-25-12   uses cpap since 2000 setting between 9 and 13    Past Surgical History:  Procedure Laterality Date   [place] and 1981   x2    [place]  02-25-12  abnormal vaginal bleeding- ? 5 yrs ago   KNEE ARTHROSCOPY  03/03/2012   Procedure: ARTHROSCOPY KNEE;  Surgeon: Gearlean Alf, MD;  Location: [facility];  Service: Orthopedics;  Laterality: Right;  Right Knee Arthroscopy, medial meniscectomy and chrondroplasty   LITHOTRIPSY  02-25-12   x2    TONSILLECTOMY  02-25-12   child   TOTAL KNEE ARTHROPLASTY Right 11/19/2015   Procedure: TOTAL KNEE ARTHROPLASTY;  Surgeon: Gaynelle Arabian, MD;  Location: [facility];  Service: Orthopedics;  Laterality: Right;   TOTAL KNEE ARTHROPLASTY Left 07/28/2016   Procedure: LEFT TOTAL KNEE ARTHROPLASTY;  Surgeon: Gaynelle Arabian, MD;  Location: [facility];  Service: Orthopedics;  Laterality: Left;     Current Outpatient Medications  Medication Sig Dispense Refill   acetaminophen (TYLENOL) 500 MG tablet Take 1,000 mg by mouth every 6 (six) hours as needed for mild pain.     amLODipine (NORVASC) 2.5 MG tablet Take  2.5 mg by mouth daily.      citalopram (CELEXA) 20 MG tablet Take 20 mg by mouth every evening.      ELIQUIS 5 MG TABS tablet TAKE ONE TABLET BY MOUTH TWICE DAILY 180 tablet 1   metoprolol tartrate (LOPRESSOR) 25 MG tablet Take 12.5 mg by mouth 2 (two) times daily.     Pitavastatin Magnesium (ZYPITAMAG) 2 MG TABS Take 2 mg by mouth daily in the afternoon.     Polyethyl Glycol-Propyl Glycol (SYSTANE OP) Apply 1 drop to eye 2 (two) times daily as needed (for dry eyes). Dry eyes     triamterene-hydrochlorothiazide (MAXZIDE) 75-50 MG tablet Take 0.5 tablets by mouth daily. (Patient taking differently: Take 1 tablet by mouth daily.) 45 tablet 3   albuterol (PROVENTIL HFA;VENTOLIN HFA) 108 (90 Base) MCG/ACT inhaler Inhale 2 puffs into the lungs every 6 (six) hours as needed for wheezing or shortness of breath. 1 Inhaler 5   No current facility-administered medications for this visit.    Allergies:   Milk-related compounds, Penicillins, Atorvastatin, and Pravastatin    Social History:  The patient  reports that she quit smoking about 31 years ago. Her smoking use included cigarettes. She has a 20.00 pack-year smoking history. She has never used smokeless tobacco. She reports current alcohol use. She reports that she does not use drugs.   Family History:  The patient's family history includes Arthritis in her mother; Diabetes in her father; Heart attack in her father and mother; Heart disease in her father; Hypercholesterolemia in her sister; Hypertension in her brother, brother, and mother; Hypothyroidism in her sister; Prostate cancer in her father.    ROS:  Please see the history of present illness.   Otherwise, review of systems are positive for GERD.   All other systems are reviewed and negative.    PHYSICAL EXAM: VS:  BP (!) 124/58   Pulse 64   Ht 5' 4.75" (1.645 m)   Wt 229 lb (103.9 kg)   SpO2 96%   BMI 38.40 kg/m  , BMI Body mass index is 38.4 kg/m. GEN: Well nourished, well  developed, in no acute distress HEENT: normal Neck: no JVD, carotid bruits, or masses Cardiac: RRR; no murmurs, rubs, or gallops,no edema  Respiratory:  clear to auscultation bilaterally, normal work of breathing GI: soft, nontender, nondistended, + BS MS: no deformity or atrophy Skin: warm and dry, no rash Neuro:  Strength and sensation are intact Psych: euthymic mood, full affect   EKG:   The ekg ordered today demonstrates normal ECG  Recent Labs: No results found for requested labs within last 365 days.   Lipid Panel No results found for: "CHOL", "TRIG", "HDL", "CHOLHDL", "VLDL", "LDLCALC", "LDLDIRECT"   Other studies Reviewed: Additional studies/ records that were reviewed today with results demonstrating: .   ASSESSMENT AND PLAN:  Atrial fibrillation: Eliquis for stroke prevention.  No bleeding problems.  No significant palpitations. Coronary artery calcification: No angina.  Continue aggressive secondary prevention.  Whole food, plant-based diet.  High-fiber diet.  Avoid processed foods. Aortic atherosclerosis: LDL 81.  Continue statin.  LDL 81. Prediabetes: A1c 6.1. Hypertension: The current medical regimen is effective;  continue present plan and medications.   Current medicines are reviewed at length with the patient today.  The patient concerns regarding her medicines were addressed.  The following changes have been made:  No change  Labs/ tests ordered today include:  No orders of the defined types were placed in this encounter.   Recommend 150 minutes/week of aerobic exercise Low fat, low carb, high fiber diet recommended  Disposition:   FU in 1 year   Signed, Larae Grooms, MD  07/07/2022 3:48 PM    [facility] [address], [place], [place]  [address] Phone: [phone]; Fax: [phone]

## 2022-07-07 NOTE — ED Triage Notes (Signed)
Pt states that her Apple watch told her that her HR was in the 150s around 1300 today. Pt took a metoprolol per her cardiologist and is still tachy.

## 2022-07-08 ENCOUNTER — Telehealth: Payer: Self-pay | Admitting: Cardiology

## 2022-07-08 ENCOUNTER — Emergency Department (HOSPITAL_BASED_OUTPATIENT_CLINIC_OR_DEPARTMENT_OTHER)
Admission: EM | Admit: 2022-07-08 | Discharge: 2022-07-08 | Disposition: A | Discharge status: Home / Self Care | Payer: Medicare Other | Attending: Emergency Medicine | Admitting: Emergency Medicine

## 2022-07-08 DIAGNOSIS — I480 Paroxysmal atrial fibrillation: Secondary | ICD-10-CM

## 2022-07-08 MED ORDER — METOPROLOL TARTRATE 5 MG/5ML IV SOLN: 5.0000 mg | Freq: Once | INTRAVENOUS | Status: DC

## 2022-07-08 MED ORDER — SODIUM CHLORIDE 0.9 % IV BOLUS
500.0000 mL | Freq: Once | INTRAVENOUS | Status: AC
  Administered 2022-07-08: 500 mL via INTRAVENOUS

## 2022-07-08 MED ORDER — METOPROLOL TARTRATE 5 MG/5ML IV SOLN
5.0000 mg | Freq: Once | INTRAVENOUS | Status: AC
  Administered 2022-07-08: 5 mg via INTRAVENOUS
  Filled 2022-07-08: qty 5

## 2022-07-08 NOTE — Telephone Encounter (Signed)
Left a message for the pt to call back.. she was seen in the ED last night.

## 2022-07-08 NOTE — Telephone Encounter (Signed)
I spoke with the pt and she was seen in the ED and had IV Lopressor... her HR now is in the 70's which is still high for her according to her but she took her meds late since in the ED... she will rest today and we will call and check on her tomorrow and see how she is doing.

## 2022-07-08 NOTE — ED Notes (Signed)
Pt verbalizes understanding of discharge instructions. Opportunity for questioning and answers were provided. Pt discharged from ED to home with husband.    

## 2022-07-08 NOTE — Telephone Encounter (Signed)
Can increase metoprolol to 25 mg BID.

## 2022-07-08 NOTE — ED Provider Notes (Signed)
[facility]  Provider Note  CSN: [id number] Arrival date & time: 07/07/22 2205  History Chief Complaint  Patient presents with   Tachycardia    Shannon Aguilar is a 76 y.o. female with history of PAF on Eliquis and metoprolol reports she has been doing well in recent days. Actually saw Cardiology earlier in the day and had a normal EKG then. She reports at dinner time, she noted her Apple watch showed a HR of 150s. She called the oncall NP who recommended she take '25mg'$  of metoprolol in the evening instead of her usual 12.'5mg'$ . After an hour or so, HR had not improved much so she came in for evaluation. She has not had any CP or SOB. She reports she had a diet pepsi yesterday and today which is unusual for her but no other unusual stimulant intake. She does not always know when she is in afib unless her HR is elevated.    Home Medications Prior to Admission medications   Medication Sig Start Date End Date Taking? Authorizing Provider  acetaminophen (TYLENOL) 500 MG tablet Take 1,000 mg by mouth every 6 (six) hours as needed for mild pain.    [provider]  albuterol (PROVENTIL HFA;VENTOLIN HFA) 108 (90 Base) MCG/ACT inhaler Inhale 2 puffs into the lungs every 6 (six) hours as needed for wheezing or shortness of breath. 09/16/18   Byrum, Rose Fillers, MD  amLODipine (NORVASC) 2.5 MG tablet Take 2.5 mg by mouth daily.  05/27/16   [provider]  citalopram (CELEXA) 20 MG tablet Take 20 mg by mouth every evening.     [provider]  ELIQUIS 5 MG TABS tablet TAKE ONE TABLET BY MOUTH TWICE DAILY 06/23/22   Jettie Booze, MD  metoprolol tartrate (LOPRESSOR) 25 MG tablet Take 12.5 mg by mouth 2 (two) times daily.    [provider]  Pitavastatin Magnesium (ZYPITAMAG) 2 MG TABS Take 2 mg by mouth daily in the afternoon. 01/10/20   [provider]  Polyethyl Glycol-Propyl Glycol (SYSTANE OP) Apply 1 drop to eye  2 (two) times daily as needed (for dry eyes). Dry eyes    [provider]  triamterene-hydrochlorothiazide (MAXZIDE) 75-50 MG tablet Take 0.5 tablets by mouth daily. Patient taking differently: Take 1 tablet by mouth daily. 04/26/20   Jettie Booze, MD     Allergies    Milk-related compounds, Penicillins, Atorvastatin, and Pravastatin   Review of Systems   Review of Systems Please see HPI for pertinent positives and negatives  Physical Exam BP 120/61   Pulse (!) 117   Temp 97.8 F (36.6 C) (Oral)   Resp 15   Ht 5' 4.75" (1.645 m)   Wt 103.9 kg   SpO2 97%   BMI 38.40 kg/m   Physical Exam Vitals and nursing note reviewed.  Constitutional:      Appearance: Normal appearance.  HENT:     Head: Normocephalic and atraumatic.     Nose: Nose normal.     Mouth/Throat:     Mouth: Mucous membranes are moist.  Eyes:     Extraocular Movements: Extraocular movements intact.     Conjunctiva/sclera: Conjunctivae normal.  Cardiovascular:     Rate and Rhythm: Tachycardia present. Rhythm irregular.  Pulmonary:     Effort: Pulmonary effort is normal.     Breath sounds: Normal breath sounds.  Abdominal:     General: Abdomen is flat.     Palpations: Abdomen is soft.  Tenderness: There is no abdominal tenderness.  Musculoskeletal:        General: No swelling. Normal range of motion.     Cervical back: Neck supple.  Skin:    General: Skin is warm and dry.  Neurological:     General: No focal deficit present.     Mental Status: She is alert.  Psychiatric:        Mood and Affect: Mood normal.     ED Results / Procedures / Treatments   EKG EKG Interpretation  Date/Time:  Monday July 07 2022 22:19:08 EST Ventricular Rate:  116 PR Interval:    QRS Duration: 88 QT Interval:  296 QTC Calculation: 411 R Axis:   52 Text Interpretation: Atrial fibrillation with rapid ventricular response Nonspecific ST and T wave abnormality Abnormal ECG When compared with ECG  of 17-May-2016 18:57, Atrial fibrillation has replaced Sinus rhythm Vent. rate has increased BY  47 BPM Confirmed by Calvert Cantor [phone]) on 07/08/2022 12:50:54 AM  Procedures Procedures  Medications Ordered in the ED Medications  sodium chloride 0.9 % bolus 500 mL (0 mLs Intravenous Stopped 07/08/22 0324)  metoprolol tartrate (LOPRESSOR) injection 5 mg (5 mg Intravenous Given 07/08/22 0244)    Initial Impression and Plan  Patient here with afib RVR, rate is variable from 100 to 130s on monitor during my evaluation. She has a borderline BP, so will give a 500cc fluid bolus and if improved enough to tolerate IV lopressor, will give that a try. Discussed the possibility she may require electrical cardioversion. She has been compliant with her eliquis with no missed doses. Labs done in triage show a CBC with mild leukocytosis, BMP is unremarkable.   ED Course   Clinical Course as of 07/08/22 0350  Tue Jul 08, 2022  0138 I personally viewed the images from radiology studies and agree with radiologist interpretation:  CXR is clear.  [CS]  1157 BP improved with IVF. Will give a dose of Lopressor and reassess.  [CS]  [id number] Patient has converted to NSR, feeling well and would like to go home. Recommend she continue her home meds and follow up with Cardiology [CS]    Clinical Course User Index [CS] Truddie Hidden, MD     MDM Rules/Calculators/A&P Medical Decision Making Given presenting complaint, I considered that admission might be necessary. After review of results from ED lab and/or imaging studies, admission to the hospital is not indicated at this time.    Problems Addressed: PAF (paroxysmal atrial fibrillation) ([place]): chronic illness or injury with exacerbation, progression, or side effects of treatment  Amount and/or Complexity of Data Reviewed Labs: ordered. Decision-making details documented in ED Course. Radiology: ordered and independent interpretation performed.  Decision-making details documented in ED Course. ECG/medicine tests: ordered and independent interpretation performed. Decision-making details documented in ED Course.  Risk Prescription drug management. Decision regarding hospitalization.    Final Clinical Impression(s) / ED Diagnoses Final diagnoses:  PAF (paroxysmal atrial fibrillation) ([place])    Rx / DC Orders ED Discharge Orders     None        Truddie Hidden, MD 07/08/22 [phone]

## 2022-07-08 NOTE — Telephone Encounter (Signed)
Follow Up:     Patient is returning Ann's call from today.

## 2022-07-09 MED ORDER — METOPROLOL TARTRATE 25 MG PO TABS: 25.0000 mg | ORAL_TABLET | Freq: Two times a day (BID) | ORAL | 3 refills | Status: DC

## 2022-07-09 NOTE — Telephone Encounter (Signed)
I spoke with patient and gave her information from Dr Irish Lack.  She will continue to monitor heart rate and BP and let us know if any problems.  Prescription sent to [facility]

## 2022-07-14 ENCOUNTER — Telehealth: Payer: Self-pay

## 2022-07-14 ENCOUNTER — Other Ambulatory Visit: Payer: Self-pay | Admitting: Obstetrics and Gynecology

## 2022-07-14 DIAGNOSIS — Z1231 Encounter for screening mammogram for malignant neoplasm of breast: Secondary | ICD-10-CM

## 2022-07-14 NOTE — Telephone Encounter (Signed)
.  edp

## 2022-07-14 NOTE — Telephone Encounter (Signed)
        Patient  visited [place] on 11/14    Telephone encounter attempt :  1st  A HIPAA compliant voice message was left requesting a return call.  Instructed patient to call back    [facility]  [phone] [address], [place], [place] [address] Phone: [phone] Email: [email or web address]

## 2022-07-25 DIAGNOSIS — I10 Essential (primary) hypertension: Secondary | ICD-10-CM | POA: Diagnosis not present

## 2022-07-25 DIAGNOSIS — D6869 Other thrombophilia: Secondary | ICD-10-CM | POA: Diagnosis not present

## 2022-07-25 DIAGNOSIS — Z87898 Personal history of other specified conditions: Secondary | ICD-10-CM | POA: Diagnosis not present

## 2022-07-25 DIAGNOSIS — M5030 Other cervical disc degeneration, unspecified cervical region: Secondary | ICD-10-CM | POA: Diagnosis not present

## 2022-07-25 DIAGNOSIS — N1831 Chronic kidney disease, stage 3a: Secondary | ICD-10-CM | POA: Diagnosis not present

## 2022-07-25 DIAGNOSIS — R053 Chronic cough: Secondary | ICD-10-CM | POA: Diagnosis not present

## 2022-07-25 DIAGNOSIS — E1169 Type 2 diabetes mellitus with other specified complication: Secondary | ICD-10-CM | POA: Diagnosis not present

## 2022-07-25 DIAGNOSIS — J45909 Unspecified asthma, uncomplicated: Secondary | ICD-10-CM | POA: Diagnosis not present

## 2022-07-25 DIAGNOSIS — G4730 Sleep apnea, unspecified: Secondary | ICD-10-CM | POA: Diagnosis not present

## 2022-07-25 DIAGNOSIS — I480 Paroxysmal atrial fibrillation: Secondary | ICD-10-CM | POA: Diagnosis not present

## 2022-07-25 DIAGNOSIS — E782 Mixed hyperlipidemia: Secondary | ICD-10-CM | POA: Diagnosis not present

## 2022-09-01 DIAGNOSIS — M7542 Impingement syndrome of left shoulder: Secondary | ICD-10-CM | POA: Diagnosis not present

## 2022-09-04 ENCOUNTER — Emergency Department (HOSPITAL_BASED_OUTPATIENT_CLINIC_OR_DEPARTMENT_OTHER): Payer: Medicare Other

## 2022-09-04 ENCOUNTER — Emergency Department (HOSPITAL_BASED_OUTPATIENT_CLINIC_OR_DEPARTMENT_OTHER)
Admission: EM | Admit: 2022-09-04 | Discharge: 2022-09-05 | Disposition: A | Discharge status: Home / Self Care | Payer: Medicare Other | Attending: Emergency Medicine | Admitting: Emergency Medicine

## 2022-09-04 ENCOUNTER — Encounter (HOSPITAL_BASED_OUTPATIENT_CLINIC_OR_DEPARTMENT_OTHER): Payer: Self-pay | Admitting: Emergency Medicine

## 2022-09-04 ENCOUNTER — Other Ambulatory Visit: Payer: Self-pay

## 2022-09-04 DIAGNOSIS — E1165 Type 2 diabetes mellitus with hyperglycemia: Secondary | ICD-10-CM | POA: Insufficient documentation

## 2022-09-04 DIAGNOSIS — R739 Hyperglycemia, unspecified: Secondary | ICD-10-CM

## 2022-09-04 DIAGNOSIS — I4891 Unspecified atrial fibrillation: Secondary | ICD-10-CM

## 2022-09-04 DIAGNOSIS — R002 Palpitations: Secondary | ICD-10-CM | POA: Diagnosis not present

## 2022-09-04 DIAGNOSIS — Z7901 Long term (current) use of anticoagulants: Secondary | ICD-10-CM | POA: Insufficient documentation

## 2022-09-04 LAB — BASIC METABOLIC PANEL
Anion gap: 10 (ref 5–15)
BUN: 29 mg/dL — ABNORMAL HIGH (ref 8–23)
CO2: 26 mmol/L (ref 22–32)
Calcium: 8.6 mg/dL — ABNORMAL LOW (ref 8.9–10.3)
Chloride: 97 mmol/L — ABNORMAL LOW (ref 98–111)
Creatinine, Ser: 0.91 mg/dL (ref 0.44–1.00)
GFR, Estimated: >60 mL/min (ref >60)
Glucose, Bld: 323 mg/dL — ABNORMAL HIGH (ref 70–99)
Potassium: 3.6 mmol/L (ref 3.5–5.1)
Sodium: 133 mmol/L — ABNORMAL LOW (ref 135–145)

## 2022-09-04 LAB — CBC
HCT: 40.5 % (ref 36.0–46.0)
Hemoglobin: 13.4 g/dL (ref 12.0–15.0)
MCH: 29.9 pg (ref 26.0–34.0)
MCHC: 33.1 g/dL (ref 30.0–36.0)
MCV: 90.4 fL (ref 80.0–100.0)
Platelets: 267 10*3/uL (ref 150–400)
RBC: 4.48 MIL/uL (ref 3.87–5.11)
RDW: 12.4 % (ref 11.5–15.5)
WBC: 13.6 10*3/uL — ABNORMAL HIGH (ref 4.0–10.5)
nRBC: 0 % (ref 0.0–0.2)

## 2022-09-04 LAB — TROPONIN I (HIGH SENSITIVITY): Troponin I (High Sensitivity): 7 ng/L (ref <18)

## 2022-09-04 MED ORDER — DILTIAZEM HCL-DEXTROSE 125-5 MG/125ML-% IV SOLN (PREMIX)
5.0000 mg/h | INTRAVENOUS | Status: DC
  Administered 2022-09-04: 5 mg/h via INTRAVENOUS
  Filled 2022-09-04: qty 125

## 2022-09-04 MED ORDER — DILTIAZEM LOAD VIA INFUSION
10.0000 mg | Freq: Once | INTRAVENOUS | Status: AC
  Administered 2022-09-04: 10 mg via INTRAVENOUS
  Filled 2022-09-04: qty 10

## 2022-09-04 NOTE — ED Notes (Signed)
Pt c/o palpitations Atrial fib rate 130s, denies any chest pain Pt has hx of atrial fib

## 2022-09-04 NOTE — ED Triage Notes (Signed)
Pt reports hx of afib. Palpitations started around 1845 today. HR 132 in triage. No chest pain. No shob. On eliquis and metoprolol.

## 2022-09-05 ENCOUNTER — Telehealth: Payer: Self-pay | Admitting: Interventional Cardiology

## 2022-09-05 ENCOUNTER — Other Ambulatory Visit: Payer: Self-pay

## 2022-09-05 DIAGNOSIS — I4891 Unspecified atrial fibrillation: Secondary | ICD-10-CM | POA: Diagnosis not present

## 2022-09-05 LAB — TROPONIN I (HIGH SENSITIVITY): Troponin I (High Sensitivity): 8 ng/L (ref <18)

## 2022-09-05 MED ORDER — SODIUM CHLORIDE 0.9 % IV BOLUS
500.0000 mL | Freq: Once | INTRAVENOUS | Status: AC
  Administered 2022-09-05: 500 mL via INTRAVENOUS

## 2022-09-05 NOTE — ED Notes (Signed)
Patient reports the injections she receives is a mixture of 10 CC 1% lidocaine and 2cc celestone ('6mg'$ /cc). MD Palumbo made aware.

## 2022-09-05 NOTE — Progress Notes (Signed)
Plan of Care Note for accepted transfer   Patient: Shannon Aguilar MRN: [id number]   DOA: 09/04/2022  Facility requesting transfer: [facility]   Requesting Provider: Dr. Randal Buba   Reason for transfer: Rapid atrial fibrillation   Facility course: 77 yr old lady with HTN, COPD, diet-controlled DM, and PAF on Eliquis who p/w palpitations and found to be in rapid a fib. Also noted to have serum glucose of 323. She was given 1 liter NS, 10 mg IV diltiazem, and started on diltiazem infusion.    Plan of care: The patient is accepted for admission to Progressive unit, at [facility].   Author: Vianne Bulls, MD 09/05/2022  Check [email or web address] for on-call coverage.  Nursing staff, Please call [place] number on Amion as soon as patient's arrival, so appropriate admitting provider can evaluate the pt.

## 2022-09-05 NOTE — Telephone Encounter (Signed)
I spoke with patient. She went to ED last night for atrial fib.  Was discharged after converting back to SR.  Patient reports earlier this week she received cortisone injection for shoulder pain. After afib episode yesterday she realized previous episode of afib occurred within a week of receiving cortisone injection. Today she is feeling fine.  Prior to episode yesterday heart rate was running in the upper 60's.  She will discuss other possible options for shoulder pain with ortho provider.  Patient is asking if Dr Irish Lack would review ED notes/labs and see if any changes needed.  Patient does not feel she needs office visit unless Dr Irish Lack feels it is needed.  Patient will follow up with PCP regarding elevated glucose.

## 2022-09-05 NOTE — ED Provider Notes (Addendum)
[facility] DEPARTMENT Provider Note   CSN: [id number] Arrival date & time: 09/04/22  2249     History  Chief Complaint  Patient presents with   Palpitations    Shannon Aguilar is a 77 y.o. female.  The history is provided by the patient.  Palpitations Palpitations quality:  Fast Onset quality:  Sudden Timing:  Constant Progression:  Unchanged Chronicity:  New Context comment:  Had an injection in her shoulder on Monday Relieved by:  Nothing Worsened by:  Nothing Ineffective treatments:  None tried Associated symptoms: no vomiting and no weakness   Risk factors: diabetes mellitus   Risk factors comment:  Not on medication      Home Medications Prior to Admission medications   Medication Sig Start Date End Date Taking? Authorizing Provider  acetaminophen (TYLENOL) 500 MG tablet Take 1,000 mg by mouth every 6 (six) hours as needed for mild pain.    [provider]  albuterol (PROVENTIL HFA;VENTOLIN HFA) 108 (90 Base) MCG/ACT inhaler Inhale 2 puffs into the lungs every 6 (six) hours as needed for wheezing or shortness of breath. 09/16/18   Byrum, Rose Fillers, MD  amLODipine (NORVASC) 2.5 MG tablet Take 2.5 mg by mouth daily.  05/27/16   [provider]  citalopram (CELEXA) 20 MG tablet Take 20 mg by mouth every evening.     [provider]  ELIQUIS 5 MG TABS tablet TAKE ONE TABLET BY MOUTH TWICE DAILY 06/23/22   Jettie Booze, MD  metoprolol tartrate (LOPRESSOR) 25 MG tablet Take 1 tablet (25 mg total) by mouth 2 (two) times daily. 07/09/22   Jettie Booze, MD  Pitavastatin Magnesium (ZYPITAMAG) 2 MG TABS Take 2 mg by mouth daily in the afternoon. 01/10/20   [provider]  Polyethyl Glycol-Propyl Glycol (SYSTANE OP) Apply 1 drop to eye 2 (two) times daily as needed (for dry eyes). Dry eyes    [provider]  triamterene-hydrochlorothiazide (MAXZIDE) 75-50 MG tablet Take 0.5 tablets by  mouth daily. Patient taking differently: Take 1 tablet by mouth daily. 04/26/20   Jettie Booze, MD      Allergies    Milk-related compounds, Penicillins, Atorvastatin, and Pravastatin    Review of Systems   Review of Systems  Constitutional:  Negative for fever.  HENT:  Negative for facial swelling.   Eyes:  Negative for redness.  Respiratory:  Negative for wheezing and stridor.   Cardiovascular:  Positive for palpitations.  Gastrointestinal:  Negative for vomiting.  Neurological:  Negative for weakness.  All other systems reviewed and are negative.   Physical Exam Updated Vital Signs BP (!) 130/117   Pulse 83   Temp 97.6 F (36.4 C) (Oral)   Resp (!) 21   Ht 5' 4.5" (1.638 m)   Wt 104.3 kg   SpO2 96%   BMI 38.87 kg/m  Physical Exam Vitals and nursing note reviewed.  Constitutional:      General: She is not in acute distress.    Appearance: She is well-developed.  HENT:     Head: Normocephalic and atraumatic.     Nose: Nose normal.  Eyes:     Pupils: Pupils are equal, round, and reactive to light.  Cardiovascular:     Rate and Rhythm: Tachycardia present. Rhythm irregular.     Pulses: Normal pulses.     Heart sounds: Normal heart sounds.  Pulmonary:     Effort: Pulmonary effort is normal. No respiratory distress.  Breath sounds: Normal breath sounds.  Abdominal:     General: Bowel sounds are normal. There is no distension.     Palpations: Abdomen is soft.     Tenderness: There is no abdominal tenderness. There is no guarding or rebound.  Genitourinary:    Vagina: No vaginal discharge.  Musculoskeletal:        General: Normal range of motion.     Cervical back: Normal range of motion and neck supple.  Skin:    General: Skin is warm and dry.     Capillary Refill: Capillary refill takes less than 2 seconds.     Findings: No erythema or rash.  Neurological:     General: No focal deficit present.     Mental Status: She is oriented to person, place,  and time.     Deep Tendon Reflexes: Reflexes normal.  Psychiatric:        Mood and Affect: Mood normal.     ED Results / Procedures / Treatments   Labs (all labs ordered are listed, but only abnormal results are displayed) Results for orders placed or performed during the hospital encounter of 76/16/07  Basic metabolic panel  Result Value Ref Range   Sodium 133 (L) 135 - 145 mmol/L   Potassium 3.6 3.5 - 5.1 mmol/L   Chloride 97 (L) 98 - 111 mmol/L   CO2 26 22 - 32 mmol/L   Glucose, Bld 323 (H) 70 - 99 mg/dL   BUN 29 (H) 8 - 23 mg/dL   Creatinine, Ser 0.91 0.44 - 1.00 mg/dL   Calcium 8.6 (L) 8.9 - 10.3 mg/dL   GFR, Estimated >60 >60 mL/min   Anion gap 10 5 - 15  CBC  Result Value Ref Range   WBC 13.6 (H) 4.0 - 10.5 K/uL   RBC 4.48 3.87 - 5.11 MIL/uL   Hemoglobin 13.4 12.0 - 15.0 g/dL   HCT 40.5 36.0 - 46.0 %   MCV 90.4 80.0 - 100.0 fL   MCH 29.9 26.0 - 34.0 pg   MCHC 33.1 30.0 - 36.0 g/dL   RDW 12.4 11.5 - 15.5 %   Platelets 267 150 - 400 K/uL   nRBC 0.0 0.0 - 0.2 %  Troponin I (High Sensitivity)  Result Value Ref Range   Troponin I (High Sensitivity) 7 <18 ng/L  Troponin I (High Sensitivity)  Result Value Ref Range   Troponin I (High Sensitivity) 8 <18 ng/L   DG Chest 2 View  Result Date: 09/04/2022 CLINICAL DATA:  Palpitations EXAM: CHEST - 2 VIEW COMPARISON:  Chest x-ray 07/07/2022 FINDINGS: The heart size and mediastinal contours are within normal limits. Both lungs are clear. The visualized skeletal structures are unremarkable. IMPRESSION: No active cardiopulmonary disease. Electronically Signed   By: Ronney Asters M.D.   On: 09/04/2022 23:14     EKG EKG Interpretation  Date/Time:  Thursday September 04 2022 23:00:24 EST Ventricular Rate:  125 PR Interval:    QRS Duration: 90 QT Interval:  336 QTC Calculation: 484 R Axis:   19 Text Interpretation: Atrial fibrillation with rapid ventricular response Confirmed by Dory Horn) on 09/05/2022 12:01:38  AM  Radiology DG Chest 2 View  Result Date: 09/04/2022 CLINICAL DATA:  Palpitations EXAM: CHEST - 2 VIEW COMPARISON:  Chest x-ray 07/07/2022 FINDINGS: The heart size and mediastinal contours are within normal limits. Both lungs are clear. The visualized skeletal structures are unremarkable. IMPRESSION: No active cardiopulmonary disease. Electronically Signed   By: Tina Griffiths.D.  On: 09/04/2022 23:14    Procedures Procedures    Medications Ordered in ED Medications  diltiazem (CARDIZEM) 1 mg/mL load via infusion 10 mg (10 mg Intravenous Bolus from Bag 09/04/22 2329)    And  diltiazem (CARDIZEM) 125 mg in dextrose 5% 125 mL (1 mg/mL) infusion (5 mg/hr Intravenous New Bag/Given 09/04/22 2328)  sodium chloride 0.9 % bolus 500 mL (0 mLs Intravenous Stopped 09/05/22 0113)  sodium chloride 0.9 % bolus 500 mL (500 mLs Intravenous New Bag/Given (Non-Interop) 09/05/22 0137)    ED Course/ Medical Decision Making/ A&P                           Medical Decision Making Patient with palpitations took home medication without relief   Problems Addressed: Hyperglycemia:    Details: Diabetes not on medication, likely exacerbated by steroid injection   Amount and/or Complexity of Data Reviewed External Data Reviewed: notes.    Details: Previous notes reviewed  Labs: ordered.    Details: All labs reviewed: troponins normal 7/8.  White count elevated 13.6, hemoglobin normal 13.4, normal platelet count.  Sodium low 133, normal potassium and normal creatinine.  Elevated glucose 323 Radiology: ordered and independent interpretation performed.    Details: Normal CXR ECG/medicine tests: ordered and independent interpretation performed. Decision-making details documented in ED Course.  Risk Prescription drug management. Decision regarding hospitalization. Risk Details: Initial plan was admission given ongoing AFIB and RVR.  Patient converted in the ED.  Cardizem drip discontinued.  Patient will  follow up with cardiology as an outpatient.  Strict return precautions.    Critical Care Total time providing critical care: 30 minutes (Diltiazem drip)   CRITICAL CARE Performed by: Ginnifer Creelman K Rashawn Rayman-Rasch Total critical care time: 30  minutes Critical care time was exclusive of separately billable procedures and treating other patients. Critical care was necessary to treat or prevent imminent or life-threatening deterioration. Critical care was time spent personally by me on the following activities: development of treatment plan with patient and/or surrogate as well as nursing, discussions with consultants, evaluation of patient's response to treatment, examination of patient, obtaining history from patient or surrogate, ordering and performing treatments and interventions, ordering and review of laboratory studies, ordering and review of radiographic studies, pulse oximetry and re-evaluation of patient's condition.  Final Clinical Impression(s) / ED Diagnoses Final diagnoses:  Atrial fibrillation with RVR ([place])  Hyperglycemia   Return for intractable cough, coughing up blood, fevers > 100.4 unrelieved by medication, shortness of breath, intractable vomiting, chest pain, shortness of breath, weakness, numbness, changes in speech, facial asymmetry, abdominal pain, passing out, Inability to tolerate liquids or food, cough, altered mental status or any concerns. No signs of systemic illness or infection. The patient is nontoxic-appearing on exam and vital signs are within normal limits.  I have reviewed the triage vital signs and the nursing notes. Pertinent labs & imaging results that were available during my care of the patient were reviewed by me and considered in my medical decision making (see chart for details). After history, exam, and medical workup I feel the patient has been appropriately medically screened and is safe for discharge home. Pertinent diagnoses were discussed with the  patient. Patient was given return precautions    Aloys Hupfer, MD 09/05/22 0500

## 2022-09-05 NOTE — Telephone Encounter (Signed)
Patient was in the ER last night for Afib and she would like to the nurse. Please advise

## 2022-09-11 ENCOUNTER — Ambulatory Visit
Admission: RE | Admit: 2022-09-11 | Discharge: 2022-09-11 | Disposition: A | Discharge status: Home / Self Care | Payer: Medicare Other | Source: Ambulatory Visit | Attending: Obstetrics and Gynecology | Admitting: Obstetrics and Gynecology

## 2022-09-11 DIAGNOSIS — Z1231 Encounter for screening mammogram for malignant neoplasm of breast: Secondary | ICD-10-CM | POA: Diagnosis not present

## 2022-09-12 NOTE — Telephone Encounter (Signed)
Shannon Booze, MD  to Me     09/12/22  4:59 PM Nothing to add to ER w/u

## 2022-09-14 ENCOUNTER — Telehealth: Payer: Self-pay | Admitting: Physician Assistant

## 2022-09-14 NOTE — Telephone Encounter (Signed)
77 yo female with paroxysmal atrial fibrillation. She was seen in the ED in Nov 2023 and again on 09/04/22 with recurrent AFib. She converted to normal sinus rhythm both times on rate controlling Rx.   Her and her husband have URI symptoms (congestion and cough). She has a fever of 100.5. she took Delsym to help with her cough. An hour later, she went into atrial fibrillation. She notes rates 70s-130. She is not having chest pain, shortness of breath, syncope.   PLAN:  - Increase Metoprolol tartrate to 25 mg three times a day  - Monitor BP. If SBP </=100 and symptomatic, go to the ED. - Encouraged pt to test her and her husband for COVID with home test kit - Contact PCP if COVID positive - Will try to arrange AFib clinic visit this week - She knows to go to the ED if she feels worse, has chest pain, shortness of breath, sustained HRs over [address], PA-C    09/14/2022 4:47 PM

## 2022-09-15 ENCOUNTER — Telehealth (HOSPITAL_COMMUNITY): Payer: Self-pay | Admitting: *Deleted

## 2022-09-15 DIAGNOSIS — U071 COVID-19: Secondary | ICD-10-CM | POA: Diagnosis not present

## 2022-09-15 NOTE — Telephone Encounter (Signed)
-----  Message from Claretha Cooper sent at 09/15/2022  9:10 AM EST ----- Called and she said that she was tested positive for Covid stated she will call back to schedule appt ----- Message ----- From: Juluis Mire, RN Sent: 09/15/2022   8:40 AM EST To: Claretha Cooper  Please arrange AFib Clinic visit this week for paroxysmal atrial fibrillation.  See phone note from 09/14/2022.  Thanks Richardson Dopp, PA-C    09/14/2022 4:49 PM

## 2022-09-29 ENCOUNTER — Telehealth: Payer: Self-pay | Admitting: Interventional Cardiology

## 2022-09-29 NOTE — Telephone Encounter (Signed)
New Message:     Patient said she was supposed to have been seen at the [facility]. Patient said she had beens sick and was never seen there. She called and they said she needs a new referral to be seen at the [facility].

## 2022-10-02 ENCOUNTER — Encounter (HOSPITAL_COMMUNITY): Payer: Self-pay | Admitting: *Deleted

## 2022-10-02 ENCOUNTER — Ambulatory Visit (HOSPITAL_COMMUNITY)
Admission: RE | Admit: 2022-10-02 | Discharge: 2022-10-02 | Disposition: A | Discharge status: Home / Self Care | Payer: Medicare Other | Source: Ambulatory Visit | Attending: Nurse Practitioner | Admitting: Nurse Practitioner

## 2022-10-02 VITALS — BP 152/66 | HR 56 | Ht 64.5 in | Wt 231.2 lb

## 2022-10-02 DIAGNOSIS — R001 Bradycardia, unspecified: Secondary | ICD-10-CM | POA: Diagnosis not present

## 2022-10-02 DIAGNOSIS — Z87891 Personal history of nicotine dependence: Secondary | ICD-10-CM | POA: Insufficient documentation

## 2022-10-02 DIAGNOSIS — D6869 Other thrombophilia: Secondary | ICD-10-CM | POA: Diagnosis not present

## 2022-10-02 DIAGNOSIS — I480 Paroxysmal atrial fibrillation: Secondary | ICD-10-CM | POA: Insufficient documentation

## 2022-10-02 DIAGNOSIS — Z7901 Long term (current) use of anticoagulants: Secondary | ICD-10-CM | POA: Insufficient documentation

## 2022-10-02 DIAGNOSIS — I4891 Unspecified atrial fibrillation: Secondary | ICD-10-CM | POA: Diagnosis not present

## 2022-10-02 MED ORDER — DILTIAZEM HCL 30 MG PO TABS: ORAL_TABLET | ORAL | 1 refills | Status: AC

## 2022-10-02 NOTE — Progress Notes (Signed)
Primary Care Physician: Antony Contras, MD Referring Physician: Richardson Dopp, PA Cardiologist: Dr. Philmore Pali Shannon Aguilar is a 77 y.o. female with a h/o Paroxysmal Afib since 2006.She recently has had 3 episodes of afib since last fall. 2 she preceded to the ED after around an hour in afib.  Episode in early January came on after prednisone shot to the shoulder. She usually converts quickly with IV cardizem of lopressor IV.  She called to the cardiology office 1/21 with afib and URI and po  metoprolol converted her at that time in a matter of hours. Other than these episodes afib has been quiet.   She was referred to the afib clinic. She is in SR today. We dicussed options to control afib.   Today, she denies symptoms of palpitations, chest pain, shortness of breath, orthopnea, PND, lower extremity edema, dizziness, presyncope, syncope, or neurologic sequela. The patient is tolerating medications without difficulties and is otherwise without complaint today.   Past Medical History:  Diagnosis Date   Anxiety    Arthritis 02-25-12   arthritis-neck, fingers, hips, knees   Chronic kidney disease 02-25-12   kidney stones- litho's x2   Depression    Dysrhythmia 2006   hx. A. Fib, no recent problems -rate control-NSR  at present, 2 or 3 events 2017   History of blood transfusion 1978 and 1981   History of skin cancer    Hypertension    Pneumonia as child   Pre-diabetes    Rash    red area chest from heart monitor on chest and abdomen areas healing   Sleep apnea 02-25-12   uses cpap since 2000 setting between 9 and 13   Past Surgical History:  Procedure Laterality Date   [place] and 1981   x2    [place]  02-25-12   abnormal vaginal bleeding- ? 5 yrs ago   KNEE ARTHROSCOPY  03/03/2012   Procedure: ARTHROSCOPY KNEE;  Surgeon: Gearlean Alf, MD;  Location: [facility];  Service: Orthopedics;  Laterality: Right;  Right Knee Arthroscopy, medial  meniscectomy and chrondroplasty   LITHOTRIPSY  02-25-12   x2    TONSILLECTOMY  02-25-12   child   TOTAL KNEE ARTHROPLASTY Right 11/19/2015   Procedure: TOTAL KNEE ARTHROPLASTY;  Surgeon: Gaynelle Arabian, MD;  Location: [facility];  Service: Orthopedics;  Laterality: Right;   TOTAL KNEE ARTHROPLASTY Left 07/28/2016   Procedure: LEFT TOTAL KNEE ARTHROPLASTY;  Surgeon: Gaynelle Arabian, MD;  Location: [facility];  Service: Orthopedics;  Laterality: Left;    Current Outpatient Medications  Medication Sig Dispense Refill   acetaminophen (TYLENOL) 500 MG tablet Take 1,000 mg by mouth every 6 (six) hours as needed for mild pain.     amLODipine (NORVASC) 2.5 MG tablet Take 2.5 mg by mouth daily.      citalopram (CELEXA) 20 MG tablet Take 20 mg by mouth every evening.      ELIQUIS 5 MG TABS tablet TAKE ONE TABLET BY MOUTH TWICE DAILY 180 tablet 1   metoprolol tartrate (LOPRESSOR) 25 MG tablet Take 1 tablet (25 mg total) by mouth 2 (two) times daily. 180 tablet 3   permethrin (ELIMITE) 5 % cream Apply 1 Application topically as needed.     Pitavastatin Magnesium (ZYPITAMAG) 2 MG TABS Take 2 mg by mouth daily in the afternoon.     Polyethyl Glycol-Propyl Glycol (SYSTANE OP) Apply 1 drop to eye 2 (two) times daily as  needed (for dry eyes). Dry eyes     triamterene-hydrochlorothiazide (MAXZIDE-25) 37.5-25 MG tablet Take 2 tablets by mouth every morning.     No current facility-administered medications for this encounter.    Allergies  Allergen Reactions   Milk-Related Compounds Diarrhea   Penicillins Hives and Rash    ++ got ancef in 2013++ Has patient had a PCN reaction causing immediate rash, facial/tongue/throat swelling, SOB or lightheadedness with hypotension: unk Has patient had a PCN reaction causing severe rash involving mucus membranes or skin necrosis: no Has patient had a PCN reaction that required hospitalization: no Has patient had a PCN reaction occurring within the last 10 years: no If all of the  above answers are "NO", then may proceed with Cephalosporin use.   Atorvastatin Other (See Comments)   Pravastatin Other (See Comments)    Social History   Socioeconomic History   Marital status: Married    Spouse name: Not on file   Number of children: Not on file   Years of education: Not on file   Highest education level: Not on file  Occupational History   Not on file  Tobacco Use   Smoking status: Former    Packs/day: 1.00    Years: 20.00    Total pack years: 20.00    Types: Cigarettes    Quit date: 02/25/1991    Years since quitting: 31.6   Smokeless tobacco: Never  Vaping Use   Vaping Use: Never used  Substance and Sexual Activity   Alcohol use: Yes    Comment: rare- social   Drug use: No   Sexual activity: Yes  Other Topics Concern   Not on file  Social History Narrative   Not on file   Social Determinants of Health   Financial Resource Strain: Not on file  Food Insecurity: Not on file  Transportation Needs: Not on file  Physical Activity: Not on file  Stress: Not on file  Social Connections: Not on file  Intimate Partner Violence: Not on file    Family History  Problem Relation Age of Onset   Hypertension Mother    Arthritis Mother    Heart attack Mother    Heart disease Father    Prostate cancer Father    Diabetes Father    Heart attack Father    Hypertension Brother    Hypertension Brother    Hypothyroidism Sister    Hypercholesterolemia Sister     ROS- All systems are reviewed and negative except as per the HPI above  Physical Exam: Vitals:   10/02/22 1033  BP: (!) 152/66  Pulse: (!) 56  Weight: 104.9 kg  Height: 5' 4.5" (1.638 m)   Wt Readings from Last 3 Encounters:  10/02/22 104.9 kg  09/04/22 104.3 kg  07/07/22 103.9 kg    Labs: Lab Results  Component Value Date   NA 133 (L) 09/04/2022   K 3.6 09/04/2022   CL 97 (L) 09/04/2022   CO2 26 09/04/2022   GLUCOSE 323 (H) 09/04/2022   BUN 29 (H) 09/04/2022   CREATININE 0.91  09/04/2022   CALCIUM 8.6 (L) 09/04/2022   Lab Results  Component Value Date   INR 1.02 07/28/2016   No results found for: "CHOL", "HDL", "LDLCALC", "TRIG"   GEN- The patient is well appearing, alert and oriented x 3 today.   Head- normocephalic, atraumatic Eyes-  Sclera clear, conjunctiva pink Ears- hearing intact Oropharynx- clear Neck- supple, no JVP Lymph- no cervical lymphadenopathy Lungs- Clear to ausculation  bilaterally, normal work of breathing Heart- Regular rate and rhythm, no murmurs, rubs or gallops, PMI not laterally displaced GI- soft, NT, ND, + BS Extremities- no clubbing, cyanosis, or edema MS- no significant deformity or atrophy Skin- no rash or lesion Psych- euthymic mood, full affect Neuro- strength and sensation are intact  EKG-Vent. rate 56 BPM PR interval 156 ms QRS duration 90 ms QT/QTcB 442/426 ms P-R-T axes 64 44 36 Sinus bradycardia Otherwise normal ECG When compared with ECG of 05-Sep-2022 04:05, PREVIOUS ECG IS PRESENT    Assessment and Plan:  1. Paroxysmal afib  Pt states that she has had 3 specific triggers that prompted afib in the last several months but is usually quiet We discussed AAT vrs ablation  and she is  not interested in any  of these approaches at this time  We discussed when to proceed to  the ED and when she can try to convert at home, stable vrs unstable symptoms  I will rx diltiazem 30 mg one every 4 hours as needed for breakthrough afib  Continue metoprolol 25 mg bid   2. CHA2DS2VASc  score of 4 Continue eliquis 5 mg bid   F/u in afib clinic as needed  Can call here for guidance when in afib as how to approach   Butch Penny C. Yair Dusza, [facility] [address] [place], [place] [address] [phone]

## 2022-10-02 NOTE — Patient Instructions (Signed)
Cardizem 30mg  -- Take 1 tablet every 4 hours AS NEEDED for AFIB heart rate >100 as long as top BP >100.

## 2022-10-13 DIAGNOSIS — E1169 Type 2 diabetes mellitus with other specified complication: Secondary | ICD-10-CM | POA: Diagnosis not present

## 2022-10-20 DIAGNOSIS — I480 Paroxysmal atrial fibrillation: Secondary | ICD-10-CM | POA: Diagnosis not present

## 2022-10-20 DIAGNOSIS — Z7901 Long term (current) use of anticoagulants: Secondary | ICD-10-CM | POA: Diagnosis not present

## 2022-10-21 ENCOUNTER — Telehealth: Payer: Self-pay | Admitting: *Deleted

## 2022-10-21 DIAGNOSIS — R931 Abnormal findings on diagnostic imaging of heart and coronary circulation: Secondary | ICD-10-CM

## 2022-10-21 NOTE — Telephone Encounter (Addendum)
Will route to pharm then anticipate simple phone call given OV <1 month ago to ensure no new issues elsewhere prompting DCCV. Last notes report recurrence 09/14/22 in setting of Covid with increase in metoprolol. In NSR at Afib visit 2/8.

## 2022-10-21 NOTE — Telephone Encounter (Signed)
Patient with diagnosis of afib on Eliquis for anticoagulation.    Procedure: colonoscopy Date of procedure: 12/11/22  CHA2DS2-VASc Score = 5  This indicates a 7.2% annual risk of stroke. The patient's score is based upon: CHF History: 0 HTN History: 1 Diabetes History: 0 Stroke History: 0 Vascular Disease History: 1 Age Score: 2 Gender Score: 1  Was in ED 09/04/22 with afib, converted to NSR.  CrCl 42m/min using adjusted body weight due to obesity Platelet count 267K  Per office protocol, patient can hold Eliquis for 1-2 days prior to procedure.    **This guidance is not considered finalized until pre-operative APP has relayed final recommendations.**

## 2022-10-21 NOTE — Telephone Encounter (Signed)
   Pre-operative Risk Assessment    Patient Name: Shannon Aguilar  DOB: [birth year] MRN: [id number]      Request for Surgical Clearance    Procedure:   COLONOSCOPY  Date of Surgery:  Clearance 12/11/22                                 Surgeon:  DR. [facility] Surgeon's Group or Practice Name:  [facility] Phone number:  [phone] Fax number:  [phone]   Type of Clearance Requested:   - Pharmacy:  Hold Apixaban (Eliquis) 1-2 DAYS PRIOR    Type of Anesthesia:   PROPOFOL   Additional requests/questions:    Astrid Divine   10/21/2022, 7:07 AM

## 2022-10-21 NOTE — Telephone Encounter (Signed)
   Patient Name: Shannon Aguilar  DOB: 1946-05-13 MRN: [id number]  Primary Cardiologist: Larae Grooms, MD  Chart reviewed as part of pre-operative protocol coverage. I spoke with patient via regular call given pharm clearance only and recent OV. The patient affirms she has been doing well without any new cardiac symptoms. Afib has remained calm since issues in January. Therefore, based on ACC/AHA guidelines, the patient would be at acceptable risk for the planned procedure without further cardiovascular testing. The patient was advised that if she develops new symptoms prior to surgery to contact our office to arrange for a follow-up visit, and she verbalized understanding.  Per pharmD, per office protocol, patient can hold Eliquis for 1-2 days prior to procedure. Will defer to GI team to inform patient of final procedure instructions.  Will route this bundled recommendation to requesting provider via Epic fax function. Please call with questions.  Charlie Pitter, PA-C 10/21/2022, 1:02 PM

## 2022-11-06 DIAGNOSIS — L814 Other melanin hyperpigmentation: Secondary | ICD-10-CM | POA: Diagnosis not present

## 2022-11-06 DIAGNOSIS — B079 Viral wart, unspecified: Secondary | ICD-10-CM | POA: Diagnosis not present

## 2022-11-06 DIAGNOSIS — L821 Other seborrheic keratosis: Secondary | ICD-10-CM | POA: Diagnosis not present

## 2022-11-06 DIAGNOSIS — D225 Melanocytic nevi of trunk: Secondary | ICD-10-CM | POA: Diagnosis not present

## 2022-11-06 DIAGNOSIS — L570 Actinic keratosis: Secondary | ICD-10-CM | POA: Diagnosis not present

## 2022-11-06 DIAGNOSIS — Z85828 Personal history of other malignant neoplasm of skin: Secondary | ICD-10-CM | POA: Diagnosis not present

## 2022-11-06 DIAGNOSIS — D485 Neoplasm of uncertain behavior of skin: Secondary | ICD-10-CM | POA: Diagnosis not present

## 2022-11-25 DIAGNOSIS — E1169 Type 2 diabetes mellitus with other specified complication: Secondary | ICD-10-CM | POA: Diagnosis not present

## 2022-12-01 DIAGNOSIS — K644 Residual hemorrhoidal skin tags: Secondary | ICD-10-CM | POA: Diagnosis not present

## 2022-12-01 DIAGNOSIS — D123 Benign neoplasm of transverse colon: Secondary | ICD-10-CM | POA: Diagnosis not present

## 2022-12-01 DIAGNOSIS — Z1211 Encounter for screening for malignant neoplasm of colon: Secondary | ICD-10-CM | POA: Diagnosis not present

## 2022-12-01 DIAGNOSIS — D122 Benign neoplasm of ascending colon: Secondary | ICD-10-CM | POA: Diagnosis not present

## 2022-12-01 DIAGNOSIS — K5730 Diverticulosis of large intestine without perforation or abscess without bleeding: Secondary | ICD-10-CM | POA: Diagnosis not present

## 2022-12-01 DIAGNOSIS — K648 Other hemorrhoids: Secondary | ICD-10-CM | POA: Diagnosis not present

## 2022-12-03 DIAGNOSIS — D122 Benign neoplasm of ascending colon: Secondary | ICD-10-CM | POA: Diagnosis not present

## 2022-12-03 DIAGNOSIS — D123 Benign neoplasm of transverse colon: Secondary | ICD-10-CM | POA: Diagnosis not present

## 2022-12-23 ENCOUNTER — Other Ambulatory Visit: Payer: Self-pay | Admitting: Interventional Cardiology

## 2022-12-23 DIAGNOSIS — I480 Paroxysmal atrial fibrillation: Secondary | ICD-10-CM

## 2022-12-23 NOTE — Telephone Encounter (Signed)
Pt last saw Rudi Coco, NP on 10/02/22, last labs 09/04/22 Creat 0.91, age 77, weight 104.9kg, based on specified criteria pt is on appropriate dosage of Eliquis 5mg  BID for afib.  Will refill rx.

## 2022-12-26 ENCOUNTER — Telehealth: Payer: Self-pay | Admitting: Interventional Cardiology

## 2022-12-26 NOTE — Telephone Encounter (Signed)
Pt called back per message received in [facility] Triage.    Pt stated her coupon code has expired, pt needs Patient Assistance for Eliquis.    Pt advised I will mail her an application to complete, and mail back to [facility] for processing.  Pt understood advisement, and verbalized understanding of process  Application placed in envelope, with additional envelope with [facility] on [address] address.  Mailed today 12/26/2022.

## 2022-12-26 NOTE — Telephone Encounter (Signed)
Pt c/o medication issue:  1. Name of Medication:   ELIQUIS 5 MG TABS tablet    2. How are you currently taking this medication (dosage and times per day)? As written   3. Are you having a reaction (difficulty breathing--STAT)?   4. What is your medication issue? Pt states her coupon code for this med has expired and she needs a renewal, please advise.

## 2023-01-06 ENCOUNTER — Telehealth: Payer: Self-pay | Admitting: Interventional Cardiology

## 2023-01-06 NOTE — Telephone Encounter (Signed)
Dropped pw off for financial assistance for eliquis  Pt has requested to be called   Pw will be in pink folder in front office

## 2023-01-08 NOTE — Telephone Encounter (Signed)
**Note De-Identified Teyton Pattillo Obfuscation** I have completed the providers page of the pts [employer] application for Eliquis, Dr Eldridge Dace has signed and dated it, and I faxed all to [employer]. I did receive conformation that the fax was sent successfully.

## 2023-01-10 IMAGING — CR DG SHOULDER 2+V*L*
5 series · 5 of 5 positions shown · non-contrast
Comparison: None.

CLINICAL DATA: Left shoulder pain.

EXAM:
LEFT SHOULDER - 2+ VIEW

[w shoulder grashey left (1 of 2)]
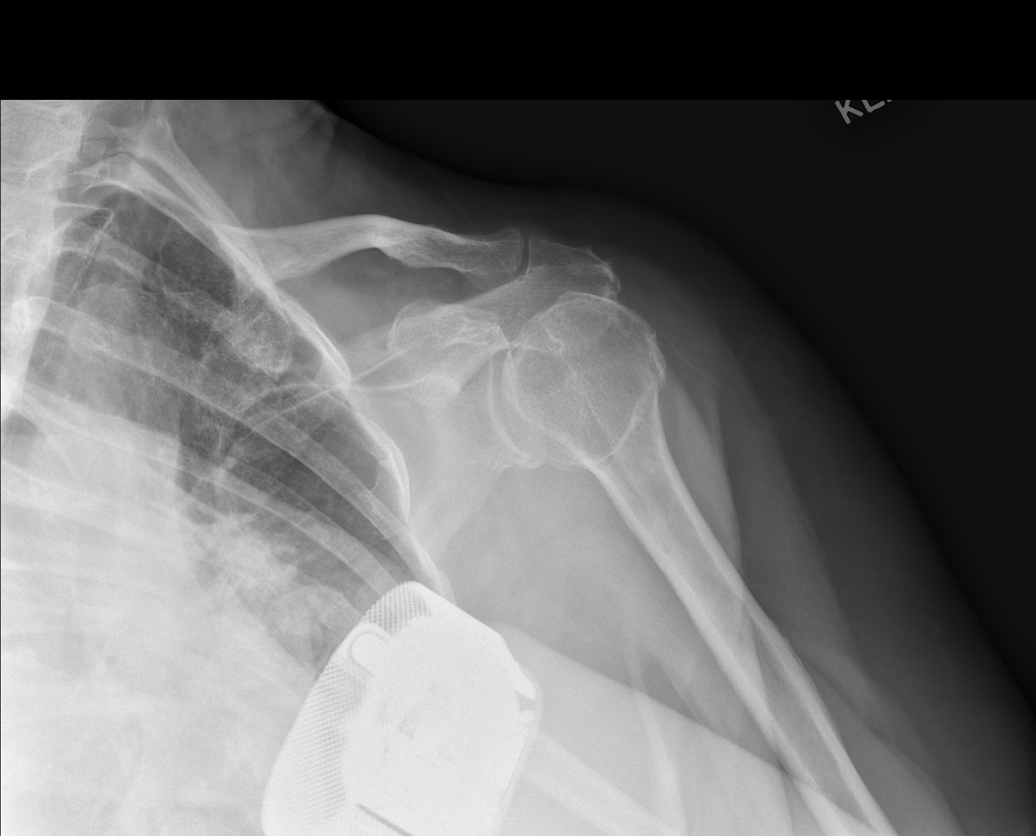

[w shoulder grashey left (2 of 2)]
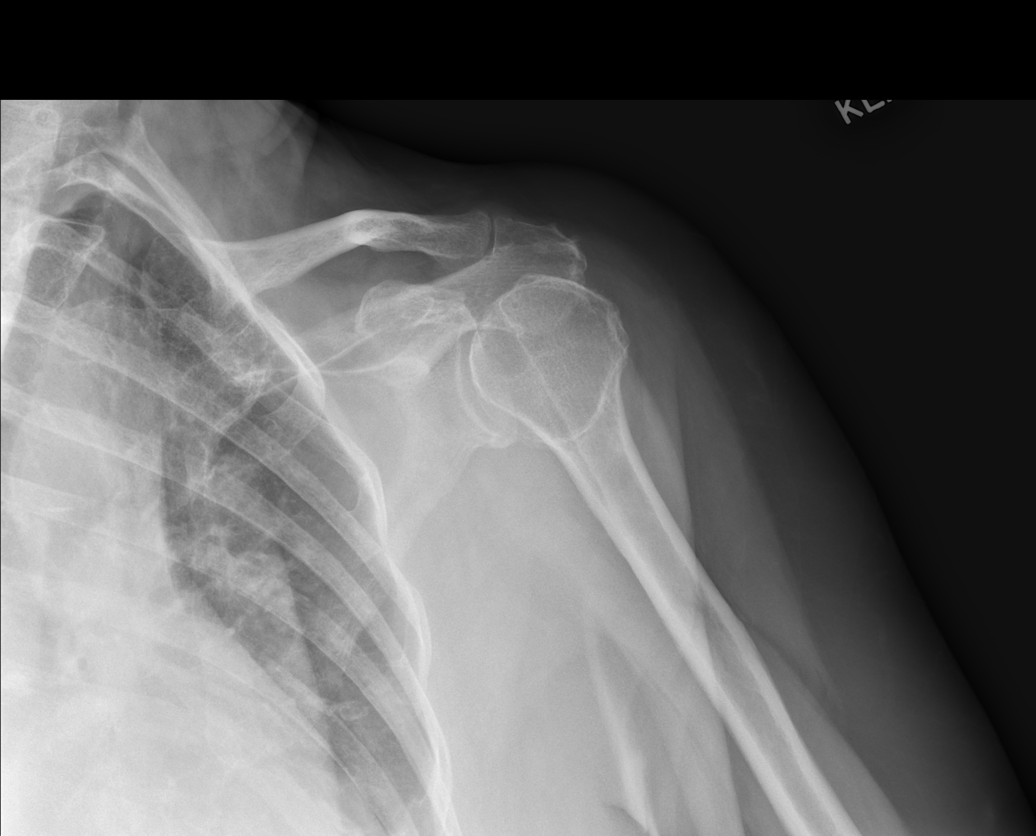

[w shoulder y-view left]
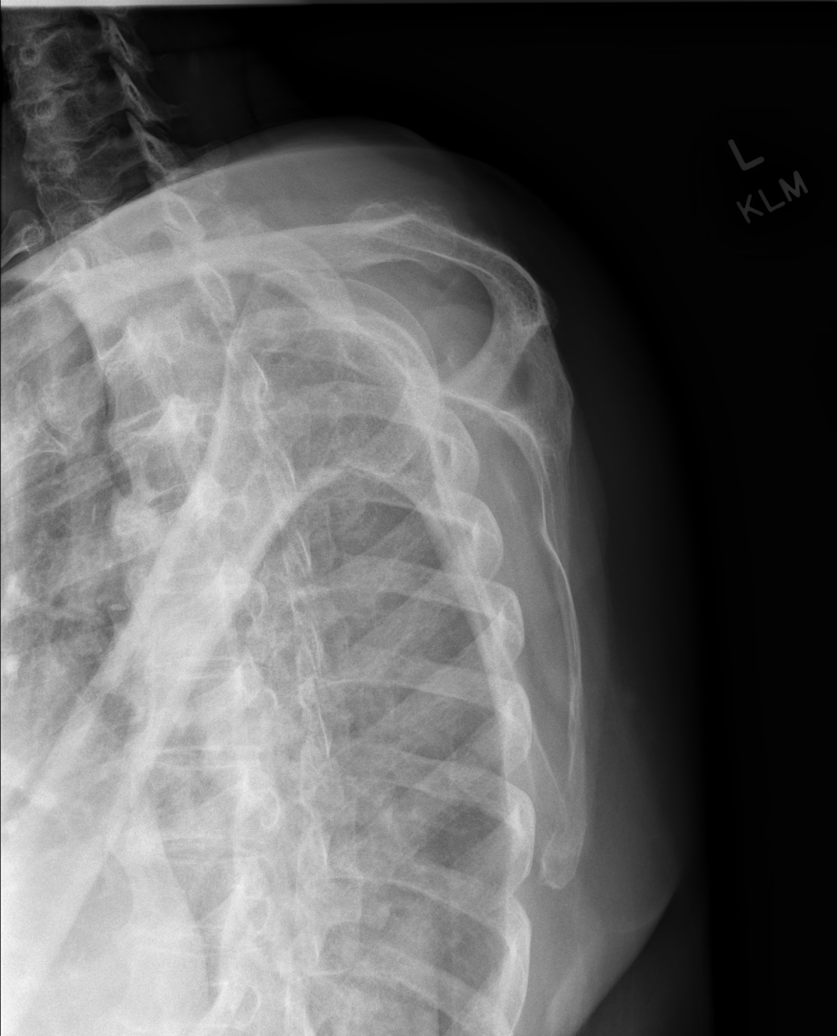

[x shoulder axillary left]
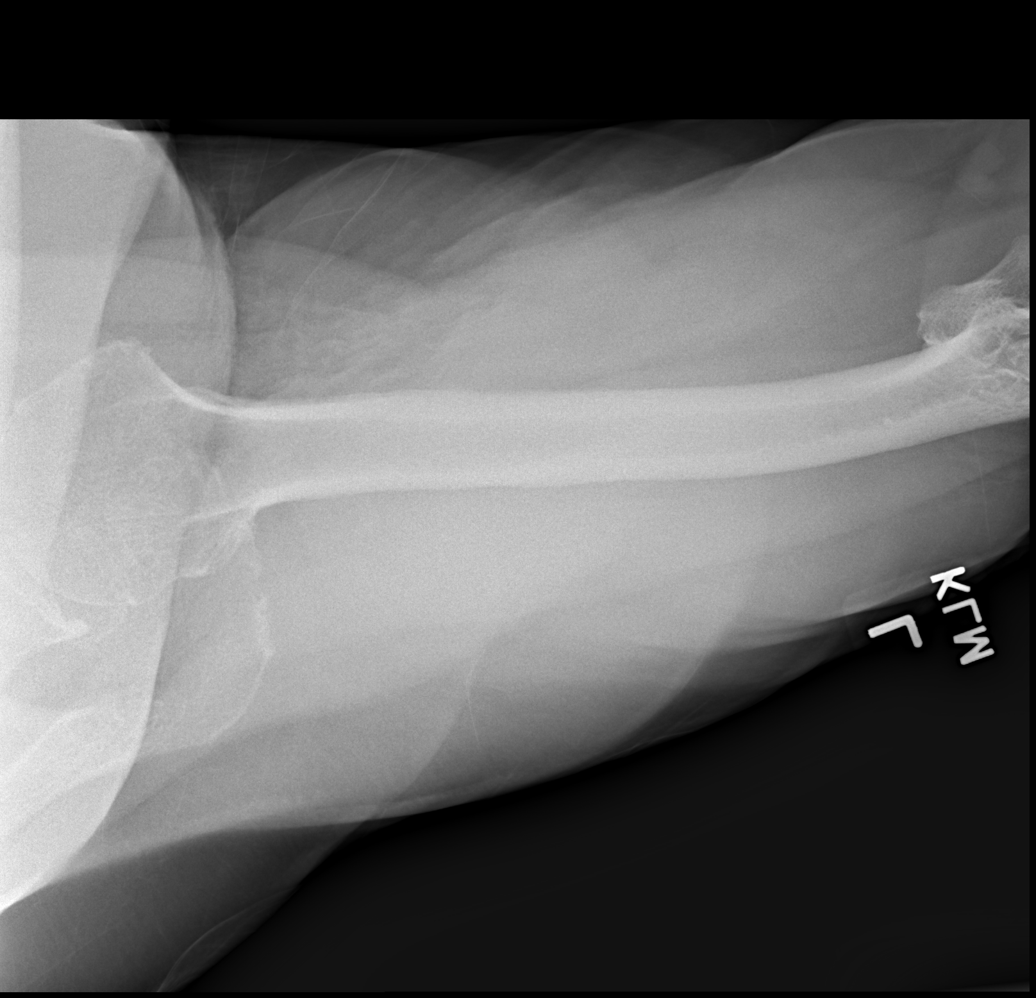

[w shoulder axillary left]
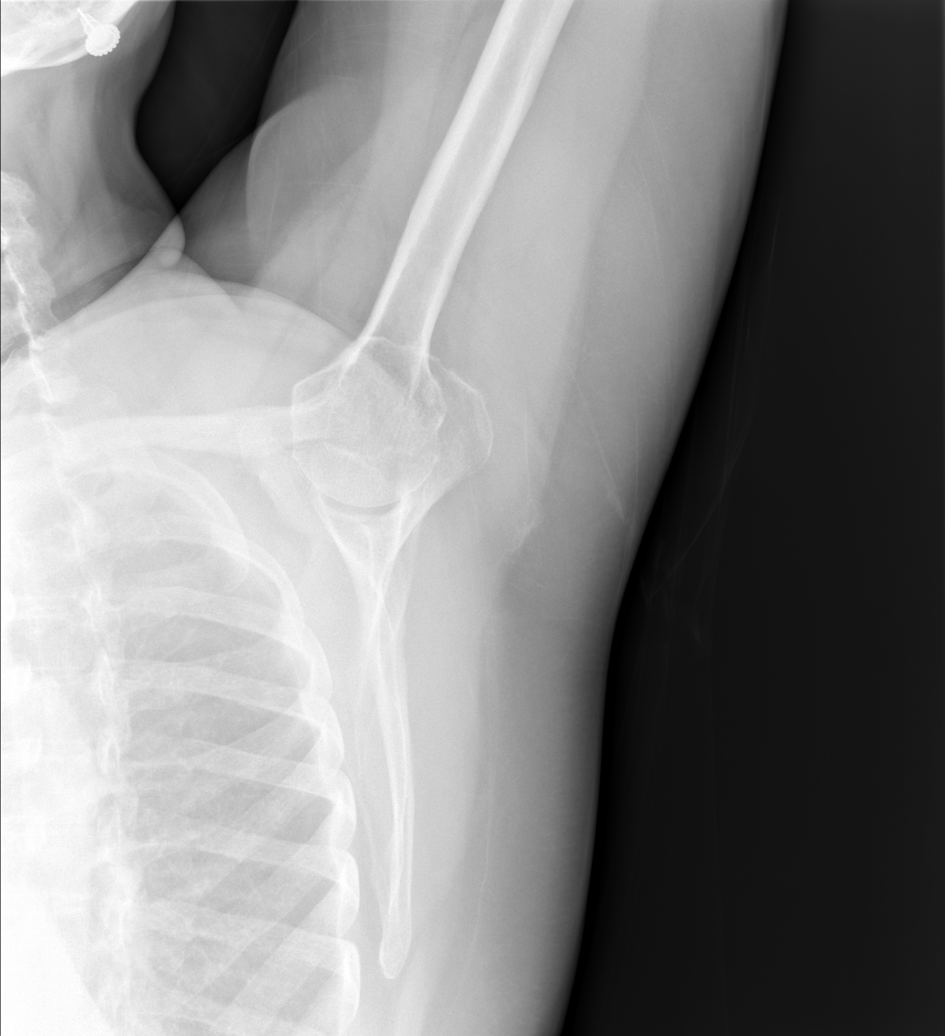

[5 of 5 positions shown; findings below may reference images not displayed]

FINDINGS: There is no acute fracture or dislocation. The bones are osteopenic.
There is moderate degenerative changes of the shoulder. The soft
tissues are unremarkable.
IMPRESSION: No acute fracture or dislocation.

## 2023-01-13 NOTE — Telephone Encounter (Signed)
**Note De-Identified [place] Obfuscation** Letter received from [facility] Shankar Silber fax stating that they denied the pt for Eliquis assistance because her household income is over their current eligibility criteria. [id number]  The letter states that they have notified the pt of this denial as well.

## 2023-01-14 ENCOUNTER — Telehealth: Payer: Self-pay | Admitting: Interventional Cardiology

## 2023-01-14 NOTE — Telephone Encounter (Signed)
Patient called to follow up on patient assistance application and to see if she can obtain a co-pay card. She currently has over a 4 week supply. Advised I would forward to Dr Maylon Cos nurse to review and we would call her back.  Patient had no other concerns at this time.

## 2023-01-14 NOTE — Telephone Encounter (Signed)
Patient called stating she was supposed to be contacted about a discount card for Eliquis.  Please advise.

## 2023-01-15 ENCOUNTER — Other Ambulatory Visit: Payer: Self-pay

## 2023-01-15 DIAGNOSIS — I480 Paroxysmal atrial fibrillation: Secondary | ICD-10-CM

## 2023-01-15 MED ORDER — APIXABAN 5 MG PO TABS: 5.0000 mg | ORAL_TABLET | Freq: Two times a day (BID) | ORAL | 3 refills | Status: DC

## 2023-01-15 NOTE — Telephone Encounter (Signed)
**Note De-Identified Yoav Okane Obfuscation** The pt states that up until this year she was able to use a Eliquis $10 co-pay card but that her pharmacy advised her that she cannot use it anymore. I advised the pt that I am calling [facility] to find out why she cannot use the co-pay card as she has a [facility] and that I will call her back.  I called [facility] and was advised that the pts primary ins coverage is through a Medicare plan and that her [facility] Plan Is secondary so they cannot use the co-pay card.  I called the number on the Eliquis $10 co-pay card and was advised that the pt will need to contact them to re-activate her card and that they can check to see if she is eligible to use the $10 co-pay card if need be.  I called the pt but got no answer so I left a message on her VM asking her to call Larita Fife back at Dr Hoyle Barr office at [phone].

## 2023-01-15 NOTE — Telephone Encounter (Signed)
**Note De-Identified Vester T[place]worth Obfuscation** No answer so I left a message on the pts VM asking her to call Larita Fife back at [facility] at Dr Hoyle Barr office at [phone].

## 2023-01-20 ENCOUNTER — Encounter: Payer: Self-pay | Admitting: Interventional Cardiology

## 2023-01-21 NOTE — Telephone Encounter (Signed)
**Note De-Identified Jaylen Knope Obfuscation** See patient message in the pts chart from 5/28 for more details.

## 2023-02-18 DIAGNOSIS — G4733 Obstructive sleep apnea (adult) (pediatric): Secondary | ICD-10-CM | POA: Diagnosis not present

## 2023-02-18 DIAGNOSIS — E1165 Type 2 diabetes mellitus with hyperglycemia: Secondary | ICD-10-CM | POA: Diagnosis not present

## 2023-02-18 DIAGNOSIS — E7800 Pure hypercholesterolemia, unspecified: Secondary | ICD-10-CM | POA: Diagnosis not present

## 2023-02-18 DIAGNOSIS — I480 Paroxysmal atrial fibrillation: Secondary | ICD-10-CM | POA: Diagnosis not present

## 2023-02-18 DIAGNOSIS — N1831 Chronic kidney disease, stage 3a: Secondary | ICD-10-CM | POA: Diagnosis not present

## 2023-02-18 DIAGNOSIS — I10 Essential (primary) hypertension: Secondary | ICD-10-CM | POA: Diagnosis not present

## 2023-04-30 DIAGNOSIS — H52203 Unspecified astigmatism, bilateral: Secondary | ICD-10-CM | POA: Diagnosis not present

## 2023-04-30 DIAGNOSIS — H5213 Myopia, bilateral: Secondary | ICD-10-CM | POA: Diagnosis not present

## 2023-04-30 DIAGNOSIS — E119 Type 2 diabetes mellitus without complications: Secondary | ICD-10-CM | POA: Diagnosis not present

## 2023-04-30 DIAGNOSIS — Z961 Presence of intraocular lens: Secondary | ICD-10-CM | POA: Diagnosis not present

## 2023-05-20 DIAGNOSIS — E7800 Pure hypercholesterolemia, unspecified: Secondary | ICD-10-CM | POA: Diagnosis not present

## 2023-05-20 DIAGNOSIS — E1165 Type 2 diabetes mellitus with hyperglycemia: Secondary | ICD-10-CM | POA: Diagnosis not present

## 2023-05-22 ENCOUNTER — Encounter: Payer: Self-pay | Admitting: Interventional Cardiology

## 2023-05-28 DIAGNOSIS — E7800 Pure hypercholesterolemia, unspecified: Secondary | ICD-10-CM | POA: Diagnosis not present

## 2023-05-28 DIAGNOSIS — E1165 Type 2 diabetes mellitus with hyperglycemia: Secondary | ICD-10-CM | POA: Diagnosis not present

## 2023-05-28 DIAGNOSIS — N1831 Chronic kidney disease, stage 3a: Secondary | ICD-10-CM | POA: Diagnosis not present

## 2023-05-28 DIAGNOSIS — I10 Essential (primary) hypertension: Secondary | ICD-10-CM | POA: Diagnosis not present

## 2023-06-01 DIAGNOSIS — E782 Mixed hyperlipidemia: Secondary | ICD-10-CM | POA: Diagnosis not present

## 2023-06-01 DIAGNOSIS — N1831 Chronic kidney disease, stage 3a: Secondary | ICD-10-CM | POA: Diagnosis not present

## 2023-06-01 DIAGNOSIS — J45909 Unspecified asthma, uncomplicated: Secondary | ICD-10-CM | POA: Diagnosis not present

## 2023-06-01 DIAGNOSIS — E1122 Type 2 diabetes mellitus with diabetic chronic kidney disease: Secondary | ICD-10-CM | POA: Diagnosis not present

## 2023-06-01 DIAGNOSIS — Z23 Encounter for immunization: Secondary | ICD-10-CM | POA: Diagnosis not present

## 2023-06-01 DIAGNOSIS — I700 Atherosclerosis of aorta: Secondary | ICD-10-CM | POA: Diagnosis not present

## 2023-06-01 DIAGNOSIS — L84 Corns and callosities: Secondary | ICD-10-CM | POA: Diagnosis not present

## 2023-06-01 DIAGNOSIS — Z0000 Encounter for general adult medical examination without abnormal findings: Secondary | ICD-10-CM | POA: Diagnosis not present

## 2023-06-01 DIAGNOSIS — I480 Paroxysmal atrial fibrillation: Secondary | ICD-10-CM | POA: Diagnosis not present

## 2023-06-01 DIAGNOSIS — M5030 Other cervical disc degeneration, unspecified cervical region: Secondary | ICD-10-CM | POA: Diagnosis not present

## 2023-06-01 DIAGNOSIS — I10 Essential (primary) hypertension: Secondary | ICD-10-CM | POA: Diagnosis not present

## 2023-06-03 ENCOUNTER — Other Ambulatory Visit: Payer: Self-pay | Admitting: Family Medicine

## 2023-06-03 DIAGNOSIS — E2839 Other primary ovarian failure: Secondary | ICD-10-CM

## 2023-06-15 ENCOUNTER — Ambulatory Visit: Payer: Medicare Other | Admitting: Podiatry

## 2023-06-15 ENCOUNTER — Encounter: Payer: Self-pay | Admitting: Podiatry

## 2023-06-15 VITALS — Ht 64.5 in | Wt 231.2 lb

## 2023-06-15 DIAGNOSIS — E119 Type 2 diabetes mellitus without complications: Secondary | ICD-10-CM | POA: Diagnosis not present

## 2023-06-15 DIAGNOSIS — Q828 Other specified congenital malformations of skin: Secondary | ICD-10-CM | POA: Diagnosis not present

## 2023-06-15 NOTE — Patient Instructions (Signed)
VISIT SUMMARY:  During your visit, we discussed your concern about the small, painful corn on your left foot. We also reviewed your diabetes management and the condition of your orthotic.  YOUR PLAN:  -CORN ON LEFT FOOT: I removed a small part of the corn from your left foot. You should apply a cream with salicylic acid (40% strength or higher) to the area every night and cover it with a band-aid. If the skin becomes red or irritated, stop the treatment until the skin peels, then start again. Continue this until the skin looks normal, with no indentation or dark spot.  -ORTHOTIC USE: Your current foot support (orthotic) is in good condition. I gave you a special pad (dancer's pad) to help reduce pressure on the area where the corn is. You should attach this pad to your orthotic.  -DIABETES: Your diabetes is well-managed, as shown by your A1c level of 6.1. You should continue with your current treatment plan.  INSTRUCTIONS:  Please follow up as needed for further care of your foot.

## 2023-06-16 DIAGNOSIS — E119 Type 2 diabetes mellitus without complications: Secondary | ICD-10-CM | POA: Insufficient documentation

## 2023-06-16 NOTE — Progress Notes (Signed)
Subjective:  Patient ID: Shannon Aguilar. H. Lucus, female    DOB: [birth year],  MRN: [id number]  Chief Complaint  Patient presents with   Callouses    Possible corns on bottom of left foot since July, pt states it is painful to walk, pt is a diabectic    Discussed the use of AI scribe software for clinical note transcription with the patient, who gave verbal consent to proceed.  History of Present Illness   The patient, with a history of diabetes and knee replacement, presents with a corn on the left foot, under the fifth metatarsal head. The corn has been present since the end of June and is described as a 'tiny little something.' Initially, the patient thought the discomfort was due to arthritis. The corn is small but causes discomfort and pain. The patient uses an orthotic, which she has had since before the COVID-19 pandemic. The patient's A1c is controlled at 6.1.          Objective:    Physical Exam   EXTREMITIES: Left foot exhibits a corn under the fifth metatarsal head, described as a small, pinpoint lesion. Foot is warm and well-fused with palpable pulses. SKIN: Diagnosis of porokeratosis under the fifth metatarsal head on the left foot. No signs of verruca plantaris or infection.       No images are attached to the encounter.    Results   Procedure: Debridement of porokeratosis Description: The lesion was debrided sharply with a scalpel. Salicylic acid was applied to the lesion. Dancer's pad was applied to the left orthotic. Informed Consent: Counseling included the application of salicylic acid to cause the skin to peel, the use of over-the-counter wart remover with high strength, and the potential for skin irritation. The patient was advised to continue treatment until the skin appears normal. Risks of irritation and infection were discussed, especially considering the patient's diabetes, but it was noted that the patient's A1c is well-controlled, reducing these  risks.  LABS A1c: 6.1%      Assessment:   1. Porokeratosis   2. Type 2 diabetes mellitus without complication, without long-term current use of insulin (HCC)   3. Encounter for diabetic foot exam [facility])      Plan:  Patient was evaluated and treated and all questions answered.  Assessment and Plan    Poriokeratosis on left foot   Debrided a small, painful lesion under the fifth metatarsal head of her left foot in office today. She will apply salicylic acid cream of 40% strength or higher nightly, covering it with a band-aid. Should the skin become inflamed, she is to pause treatment to allow the skin to peel, then resume. Treatment will continue until the skin appears normal, without any divot or dark spot.  Orthotic use   Her current orthotic is in good condition. A dancer's pad was provided to alleviate pressure on the affected area, which she will apply to the orthotic.  Diabetes   Her diabetes is well-controlled with an A1c of 6.1. She will continue her current management plan.  She will follow up as needed for further foot care.          Return if symptoms worsen or fail to improve.

## 2023-07-03 ENCOUNTER — Ambulatory Visit: Payer: Medicare Other | Attending: Internal Medicine | Admitting: Internal Medicine

## 2023-07-03 ENCOUNTER — Encounter: Payer: Self-pay | Admitting: Internal Medicine

## 2023-07-03 VITALS — BP 118/58 | HR 58 | Ht 64.5 in | Wt 221.4 lb

## 2023-07-03 DIAGNOSIS — I2510 Atherosclerotic heart disease of native coronary artery without angina pectoris: Secondary | ICD-10-CM | POA: Diagnosis not present

## 2023-07-03 DIAGNOSIS — I10 Essential (primary) hypertension: Secondary | ICD-10-CM | POA: Diagnosis not present

## 2023-07-03 DIAGNOSIS — I480 Paroxysmal atrial fibrillation: Secondary | ICD-10-CM

## 2023-07-03 DIAGNOSIS — R7303 Prediabetes: Secondary | ICD-10-CM | POA: Diagnosis not present

## 2023-07-03 DIAGNOSIS — Z79899 Other long term (current) drug therapy: Secondary | ICD-10-CM | POA: Diagnosis not present

## 2023-07-03 DIAGNOSIS — G4733 Obstructive sleep apnea (adult) (pediatric): Secondary | ICD-10-CM

## 2023-07-03 DIAGNOSIS — D6869 Other thrombophilia: Secondary | ICD-10-CM | POA: Diagnosis not present

## 2023-07-03 MED ORDER — DILTIAZEM HCL ER COATED BEADS 120 MG PO CP24: 120.0000 mg | ORAL_CAPSULE | Freq: Every day | ORAL | 3 refills | Status: DC

## 2023-07-03 NOTE — Progress Notes (Signed)
Cardiology Office Note:  .   Date:  07/03/2023  ID:  Shannon Manis P. H. Kahlea, Shannon Aguilar 03, 1947, MRN [id number] PCP: Tally Joe, MD  [facility] Providers Cardiologist:  Lance Muss, MD    History of Present Illness: .   Shannon Aguilar. H. Keaveney is a 77 y.o. female.  Discussed the use of AI scribe software for clinical note transcription with the patient, who gave verbal consent to proceed.  History of Present Illness   A 77 year old patient with a history of paroxysmal atrial fibrillation, coronary artery calcifications, prediabetes, and hypertension presents with an increase in the frequency of fast heart rate episodes over the past six to eight months. The patient believes these episodes are due to atrial fibrillation. She reports that these episodes occur once or twice a week, an increase from the three episodes she experienced last fall. The patient also reports fatigue, which she attributes to her metoprolol medication. She expresses a dislike for taking metoprolol and has reduced her dosage to half a tablet twice a day. Despite this reduction, the patient has not noticed an improvement in her fatigue. The patient also reports difficulty sleeping over the past six to eight months, waking up in the middle of the night and being unable to fall back asleep. She denies any specific triggers or stressors that may be causing this sleep disturbance. OSA on CPAP        ROS: negative except per HPI above.  Studies Reviewed: Marland Kitchen   EKG Interpretation Date/Time:  Friday July 03 2023 08:16:45 EST Ventricular Rate:  58 PR Interval:  160 QRS Duration:  92 QT Interval:  470 QTC Calculation: 461 R Axis:   14  Text Interpretation: Sinus bradycardia When compared with ECG of 02-Oct-2022 10:54, No significant change was found Confirmed by Weston Brass ([id number]) on 07/03/2023 8:35:17 AM    Results   LABS LDL: 65 (2024) HbA1c: 6.1 (2023) Triglycerides: 126 (2024)  RADIOLOGY Coronary  CTA: Mild nonobstructive CAD, calcium score 19 (2020)  DIAGNOSTIC EKG: Normal (06/30/2023)     Risk Assessment/Calculations:    CHA2DS2-VASc Score = 5   This indicates a 7.2% annual risk of stroke. The patient's score is based upon: CHF History: 0 HTN History: 1 Diabetes History: 0 Stroke History: 0 Vascular Disease History: 1 Age Score: 2 Gender Score: 1        Physical Exam:   VS:  BP (!) 118/58 (BP Location: Right Arm, Patient Position: Sitting, Cuff Size: Large)   Pulse (!) 58   Ht 5' 4.5" (1.638 m)   Wt 221 lb 6.4 oz (100.4 kg)   SpO2 98%   BMI 37.42 kg/m    Wt Readings from Last 3 Encounters:  07/03/23 221 lb 6.4 oz (100.4 kg)  06/15/23 231 lb 3.2 oz (104.9 kg)  10/02/22 231 lb 3.2 oz (104.9 kg)     Physical Exam   VITALS: BP- 118/58 CHEST: Lungs clear to auscultation. CARDIOVASCULAR: Heart sounds normal. EXTREMITIES: No edema in ankles.     GEN: Well nourished, well developed in no acute distress NECK: No JVD; No carotid bruits CARDIAC: RRR, no murmurs, rubs, gallops RESPIRATORY:  Clear to auscultation without rales, wheezing or rhonchi  ABDOMEN: Soft, non-tender, non-distended EXTREMITIES:  No edema; No deformity   ASSESSMENT AND PLAN: .    1. PAF (paroxysmal atrial fibrillation) (HCC)   2. Coronary artery calcification seen on CT scan   3. Prediabetes   4. Secondary hypercoagulable state (HCC)   5. Primary  hypertension   6. Obstructive sleep apnea   7. Medication management     Assessment and Plan    Paroxysmal Atrial Fibrillation Increased frequency of episodes since reduction of Metoprolol dose. Patient reports fatigue with Metoprolol. Currently on Eliquis 5mg  BID for anticoagulation and PRN Diltiazem 30mg  for rate control. -Discontinue Metoprolol and Amlodipine in favor of diltiazem -Start Diltiazem 120mg  daily for rate control and hypertension management. -Check blood pressure daily at home and report any significant changes. -Follow-up in  1 month to assess response to medication change.  Coronary Artery Disease Mild nonobstructive CAD with coronary artery calcifications. LDL controlled at 65 on current statin therapy. -Continue current statin therapy.  Prediabetes Recent increase in HbA1c to 7.1. Patient reports difficulty controlling blood sugar since receiving a corticosteroid injection. -Continue management under endocrinologist. -Consider correlation between blood sugar fluctuations and AFib episodes per patient query.  Hypertension Managed with Amlodipine 2.5mg  daily and Triamterene-HCTZ 37.5-25mg , 2 tablets daily. -Discontinue Amlodipine to make room for diltiazem -Continue Triamterene-HCTZ at current dose.  Sleep Disturbance Recent difficulty maintaining sleep. Patient has sleep apnea and uses CPAP. -Consider potential correlation between sleep disturbances and increased AFib episodes. -Continue CPAP use.  Follow-up Plans -Return in 1 month for medication reassessment. -Annual follow-up thereafter, or sooner if any issues arise.                Total time of encounter: 30 minutes total time of encounter, including 20 minutes spent in face-to-face patient care on the date of this encounter. This time includes coordination of care and counseling regarding above mentioned problem list. Remainder of non-face-to-face time involved reviewing chart documents/testing relevant to the patient encounter and documentation in the medical record. I have independently reviewed documentation from referring provider.   Weston Brass, MD, [facility]  [facility]

## 2023-07-03 NOTE — Patient Instructions (Signed)
Medication Instructions:   STOP METOPROLOL  STOP AMLODIPINE  START DILTIAZEM CD 120 MG ONCE DAILY  *If you need a refill on your cardiac medications before your next appointment, please call your pharmacy*   Follow-Up: At [facility], you and your health needs are our priority.  As part of our continuing mission to provide you with exceptional heart care, we have created designated Provider Care Teams.  These Care Teams include your primary Cardiologist (physician) and Advanced Practice Providers (APPs -  Physician Assistants and Nurse Practitioners) who all work together to provide you with the care you need, when you need it.  We recommend signing up for the patient portal called "MyChart".  Sign up information is provided on this After Visit Summary.  MyChart is used to connect with patients for Virtual Visits (Telemedicine).  Patients are able to view lab/test results, encounter notes, upcoming appointments, etc.  Non-urgent messages can be sent to your provider as well.   To learn more about what you can do with MyChart, go to [email or web address].    Your next appointment:   1 month(s)  Provider:   ANY APP    Then, Weston Brass MD will plan to see you again in 12 month(s).

## 2023-07-07 DIAGNOSIS — M5459 Other low back pain: Secondary | ICD-10-CM | POA: Diagnosis not present

## 2023-07-07 DIAGNOSIS — I700 Atherosclerosis of aorta: Secondary | ICD-10-CM | POA: Diagnosis not present

## 2023-07-07 DIAGNOSIS — M545 Low back pain, unspecified: Secondary | ICD-10-CM | POA: Diagnosis not present

## 2023-07-07 DIAGNOSIS — M47816 Spondylosis without myelopathy or radiculopathy, lumbar region: Secondary | ICD-10-CM | POA: Diagnosis not present

## 2023-07-14 ENCOUNTER — Encounter: Payer: Self-pay | Admitting: Internal Medicine

## 2023-07-14 NOTE — Telephone Encounter (Signed)
Patient identification verified by 2 forms. Marilynn Rail, RN    Called and spoke to patient  Patient states:   -had a longer episode of afib today   -Has been 1 week since she had any episodes   -Hr today has ranged from 63-135BPM   -Today heart rate has stayed above 100 for 15-59minutes and then it decreases   -already took daily dose of diltiazem today   -has not taken PRN dose of diltiazem 30mg    -currently out on a walk  Patient denies:   -SOB/difficulty breathing   -chest pain  Advised patient:   -when home check BP and HR, if Both above 100, HR sustaining 100 can take PRN 30mg  diltiazem  Reviewed ED warning signs/precautions  Advised patient to outreach if continues to have episodes and no improvement with medication  Patient agrees with plan, no questions at this time

## 2023-07-15 NOTE — Telephone Encounter (Signed)
Spoke to pt who is concerned about taking the 240 mg all at once.  She also is worried about how this could affect her kidneys.  Her BP's have been ranging in the 140's/60-70.  She has been on this medication regimen since 07/04/2023 (for ten days now).  Will ask pharmacist for advice on this and then call pt back (please leave VM if no answer at [phone]).

## 2023-07-17 NOTE — Telephone Encounter (Signed)
Shannon Aguilar, RPH  [place], [place] C, RN2 days ago    Diltiazem is an extended release tablet and will not affect her renal function. Increasing to 240mg  should be fine. Advise she call us back if she can not tolerate   Patient identification verified by 2 forms. Marilynn Rail, RN    Called and spoke to patient  Relayed provider message  Patient verbalized, no questions at this time

## 2023-07-30 ENCOUNTER — Other Ambulatory Visit: Payer: Self-pay | Admitting: Internal Medicine

## 2023-07-30 DIAGNOSIS — M533 Sacrococcygeal disorders, not elsewhere classified: Secondary | ICD-10-CM | POA: Diagnosis not present

## 2023-07-30 DIAGNOSIS — I480 Paroxysmal atrial fibrillation: Secondary | ICD-10-CM

## 2023-07-30 NOTE — Telephone Encounter (Signed)
*  STAT* If patient is at the pharmacy, call can be transferred to refill team.   1. Which medications need to be refilled? (please list name of each medication and dose if known) apixaban (ELIQUIS) 5 MG TABS tablet    2. Would you like to learn more about the convenience, safety, & potential cost savings by using the [facility]?      3. Are you open to using the [facility] (Type [facility]. ).   4. Which pharmacy/location (including street and city if local pharmacy) is medication to be sent to? [facility] -  ([place]), [place] - [address]    5. Do they need a 30 day or 90 day supply? 90

## 2023-07-31 MED ORDER — APIXABAN 5 MG PO TABS: 5.0000 mg | ORAL_TABLET | Freq: Two times a day (BID) | ORAL | 3 refills | Status: DC

## 2023-07-31 NOTE — Telephone Encounter (Signed)
 RX routed to [facility]

## 2023-07-31 NOTE — Telephone Encounter (Signed)
Prescription refill request for Eliquis received. Indication:afib Last office visit:11/24 Scr:0.91  1/24 Age: 77 Weight:100.4  kg  Prescription refilled

## 2023-07-31 NOTE — Telephone Encounter (Signed)
PT is requesting a refill of Eliquis to be sent to [facility] on [address] in [place]

## 2023-08-06 ENCOUNTER — Ambulatory Visit: Payer: Medicare Other | Attending: Nurse Practitioner | Admitting: Nurse Practitioner

## 2023-08-06 ENCOUNTER — Encounter: Payer: Self-pay | Admitting: Nurse Practitioner

## 2023-08-06 VITALS — BP 140/66 | HR 60 | Ht 65.0 in | Wt 215.6 lb

## 2023-08-06 DIAGNOSIS — I2510 Atherosclerotic heart disease of native coronary artery without angina pectoris: Secondary | ICD-10-CM

## 2023-08-06 DIAGNOSIS — R7303 Prediabetes: Secondary | ICD-10-CM

## 2023-08-06 DIAGNOSIS — I480 Paroxysmal atrial fibrillation: Secondary | ICD-10-CM

## 2023-08-06 DIAGNOSIS — G4733 Obstructive sleep apnea (adult) (pediatric): Secondary | ICD-10-CM | POA: Diagnosis not present

## 2023-08-06 DIAGNOSIS — I10 Essential (primary) hypertension: Secondary | ICD-10-CM | POA: Diagnosis not present

## 2023-08-06 MED ORDER — DILTIAZEM HCL ER COATED BEADS 120 MG PO CP24: 120.0000 mg | ORAL_CAPSULE | Freq: Every day | ORAL | 3 refills | Status: DC

## 2023-08-06 MED ORDER — DILTIAZEM HCL ER COATED BEADS 240 MG PO CP24: 240.0000 mg | ORAL_CAPSULE | Freq: Every day | ORAL | 3 refills | Status: DC

## 2023-08-06 NOTE — Patient Instructions (Signed)
Medication Instructions:  Stop Diltiazem 120 mg daily Start Diltiazem 240 mg daily   *If you need a refill on your cardiac medications before your next appointment, please call your pharmacy*   Lab Work: NONE ordered at this time of appointment     Testing/Procedure NONE ordered at this time of appointment    Follow-Up: At [facility], you and your health needs are our priority.  As part of our continuing mission to provide you with exceptional heart care, we have created designated Provider Care Teams.  These Care Teams include your primary Cardiologist (physician) and Advanced Practice Providers (APPs -  Physician Assistants and Nurse Practitioners) who all work together to provide you with the care you need, when you need it.  We recommend signing up for the patient portal called "MyChart".  Sign up information is provided on this After Visit Summary.  MyChart is used to connect with patients for Virtual Visits (Telemedicine).  Patients are able to view lab/test results, encounter notes, upcoming appointments, etc.  Non-urgent messages can be sent to your provider as well.   To learn more about what you can do with MyChart, go to [email or web address].    Your next appointment:   3-4 month(s)  Provider:   Parke Poisson, MD     Other Instructions

## 2023-08-06 NOTE — Progress Notes (Signed)
Office Visit    Patient Name: Shannon Aguilar. H. Vasil Date of Encounter: 08/06/2023  Primary Care Provider:  Tally Joe, MD Primary Cardiologist:  Parke Poisson, MD  Chief Complaint    77 year old female with a history of coronary artery calcifications, paroxysmal atrial fibrillation, hypertension, prediabetes, and OSA on CPAP who presents for follow-up related to atrial fibrillation.  Past Medical History    Past Medical History:  Diagnosis Date   Anxiety    Arthritis 02-25-12   arthritis-neck, fingers, hips, knees   Chronic kidney disease 02-25-12   kidney stones- litho's x2   Depression    Dysrhythmia 2006   hx. A. Fib, no recent problems -rate control-NSR  at present, 2 or 3 events 2017   History of blood transfusion 1978 and 1981   History of skin cancer    Hypertension    Pneumonia as child   Pre-diabetes    Rash    red area chest from heart monitor on chest and abdomen areas healing   Sleep apnea 02-25-12   uses cpap since 2000 setting between 9 and 13   Past Surgical History:  Procedure Laterality Date   CESAREAN SECTION  1979 and 1981   x2    DILATION AND CURETTAGE OF UTERUS  02-25-12   abnormal vaginal bleeding- ? 5 yrs ago   KNEE ARTHROSCOPY  03/03/2012   Procedure: ARTHROSCOPY KNEE;  Surgeon: Loanne Drilling, MD;  Location: [facility];  Service: Orthopedics;  Laterality: Right;  Right Knee Arthroscopy, medial meniscectomy and chrondroplasty   LITHOTRIPSY  02-25-12   x2    TONSILLECTOMY  02-25-12   child   TOTAL KNEE ARTHROPLASTY Right 11/19/2015   Procedure: TOTAL KNEE ARTHROPLASTY;  Surgeon: Ollen Gross, MD;  Location: [facility];  Service: Orthopedics;  Laterality: Right;   TOTAL KNEE ARTHROPLASTY Left 07/28/2016   Procedure: LEFT TOTAL KNEE ARTHROPLASTY;  Surgeon: Ollen Gross, MD;  Location: [facility];  Service: Orthopedics;  Laterality: Left;    Allergies  Allergies  Allergen Reactions   Milk-Related Compounds Diarrhea   Penicillins Hives and Rash    ++ got  ancef in 2013++ Has patient had a PCN reaction causing immediate rash, facial/tongue/throat swelling, SOB or lightheadedness with hypotension: unk Has patient had a PCN reaction causing severe rash involving mucus membranes or skin necrosis: no Has patient had a PCN reaction that required hospitalization: no Has patient had a PCN reaction occurring within the last 10 years: no If all of the above answers are "NO", then may proceed with Cephalosporin use.   Atorvastatin Other (See Comments)   Pravastatin Other (See Comments)     Labs/Other Studies Reviewed    The following studies were reviewed today:  Cardiac Studies & Procedures      ECHOCARDIOGRAM  ECHOCARDIOGRAM COMPLETE 08/20/2016  Narrative *[facility]* [address] [place], [place] [address] [phone]  ------------------------------------------------------------------- Transthoracic Echocardiography  Patient:    Shannon Aguilar, Shannon Aguilar MR #:       [id number] Study Date: 08/20/2016 Gender:     F Age:        70 Height:     165.1 cm Weight:     99.8 kg BSA:        2.18 m^2 Pt. Status: Room:  SONOGRAPHER  Dewitt Hoes, RDCS ATTENDING    Zoila Shutter MD ORDERING     [place], [place] S REFERRING    [place], [place] S PERFORMING   [facility], [facility]  cc:  ------------------------------------------------------------------- LV EF:  60% -   65%  ------------------------------------------------------------------- Indications:      Atrial fibrillation (I48).  ------------------------------------------------------------------- History:   PMH:   Atrial fibrillation.  Risk factors:  Former tobacco use. Hypertension. Obese. Dyslipidemia.  ------------------------------------------------------------------- Study Conclusions  - Left ventricle: The cavity size was normal. Wall thickness was normal. Systolic function was normal. The estimated ejection fraction was in the range of 60% to 65%. Wall motion was  normal; there were no regional wall motion abnormalities. Doppler parameters are consistent with abnormal left ventricular relaxation (grade 1 diastolic dysfunction). The E/e&' ratio is between 8-15, suggesting indeterminate LV filling pressure. - Left atrium: The atrium was normal in size. - Right atrium: The atrium was mildly dilated. - Inferior vena cava: The vessel was dilated. The respirophasic diameter changes were blunted (< 50%), consistent with elevated central venous pressure.  Impressions:  - LVEF 60-65%, normal wall thickness, normal wall motion, diastolic dysfunction with indeterminate LV fillling pressure, normal LA size, mild RAE, dilated IVC.  ------------------------------------------------------------------- Study data:  No prior study was available for comparison.  Study status:  Routine.  Procedure:  The patient reported no pain pre or post test. Transthoracic echocardiography. Image quality was adequate.  Study completion:  There were no complications. Transthoracic echocardiography.  M-mode, complete 2D, spectral Doppler, and color Doppler.  Birthdate:  Patient birthdate: 1946-07-08.  Age:  Patient is 77 yr old.  Sex:  Gender: female. BMI: 36.6 kg/m^2.  Blood pressure:     132/70  Patient status: Outpatient.  Study date:  Study date: 08/20/2016. Study time: 04:03 PM.  Location:  [facility]  -------------------------------------------------------------------  ------------------------------------------------------------------- Left ventricle:  The cavity size was normal. Wall thickness was normal. Systolic function was normal. The estimated ejection fraction was in the range of 60% to 65%. Wall motion was normal; there were no regional wall motion abnormalities. Doppler parameters are consistent with abnormal left ventricular relaxation (grade 1 diastolic dysfunction). The E/e&' ratio is between 8-15, suggesting indeterminate LV filling  pressure.  ------------------------------------------------------------------- Aortic valve:   Structurally normal valve. Trileaflet. Cusp separation was normal.  Doppler:  Transvalvular velocity was within the normal range. There was no stenosis. There was no regurgitation.  ------------------------------------------------------------------- Aorta:  Aortic root: The aortic root was normal in size. Ascending aorta: The ascending aorta was normal in size.  ------------------------------------------------------------------- Mitral valve:   Structurally normal valve.   Leaflet separation was normal.  Doppler:  Transvalvular velocity was within the normal range. There was no evidence for stenosis. There was no regurgitation.    Peak gradient (D): 3 mm Hg.  ------------------------------------------------------------------- Left atrium:  The atrium was normal in size.  ------------------------------------------------------------------- Atrial septum:  No defect or patent foramen ovale was identified.  ------------------------------------------------------------------- Right ventricle:  The cavity size was normal. Wall thickness was normal. Systolic function was normal.  ------------------------------------------------------------------- Pulmonic valve:    The valve appears to be grossly normal. Doppler:  There was no significant regurgitation.  ------------------------------------------------------------------- Tricuspid valve:   Doppler:  There was no significant regurgitation.  ------------------------------------------------------------------- Pulmonary artery:   The main pulmonary artery was normal-sized.  ------------------------------------------------------------------- Right atrium:  The atrium was mildly dilated.  ------------------------------------------------------------------- Pericardium:  There was no pericardial  effusion.  ------------------------------------------------------------------- Systemic veins: Inferior vena cava: The vessel was dilated. The respirophasic diameter changes were blunted (< 50%), consistent with elevated central venous pressure.  ------------------------------------------------------------------- Measurements  Left ventricle  Value        Reference LV ID, ED, PLAX chordal                47.4  mm     43 - 52 LV ID, ES, PLAX chordal                33.2  mm     23 - 38 LV fx shortening, PLAX chordal         30    %      >=29 LV PW thickness, ED                    10.9  mm     --------- IVS/LV PW ratio, ED                    0.79         <=1.3 Stroke volume, 2D                      75    ml     --------- Stroke volume/bsa, 2D                  34    ml/m^2 --------- LV e&', lateral                         12.1  cm/s   --------- LV E/e&', lateral                       7.22         --------- LV e&', medial                          7.83  cm/s   --------- LV E/e&', medial                        11.16        --------- LV e&', average                         9.97  cm/s   --------- LV E/e&', average                       8.77         ---------  Ventricular septum                     Value        Reference IVS thickness, ED                      8.63  mm     ---------  LVOT                                   Value        Reference LVOT ID, S                             19    mm     --------- LVOT area  2.84  cm^2   --------- LVOT peak velocity, S                  115   cm/s   --------- LVOT mean velocity, S                  64.4  cm/s   --------- LVOT VTI, S                            26.3  cm     --------- LVOT peak gradient, S                  5     mm Hg  ---------  Aorta                                  Value        Reference Aortic root ID, ED                     29    mm     ---------  Left atrium                             Value        Reference LA ID, A-P, ES                         42    mm     --------- LA ID/bsa, A-P                         1.92  cm/m^2 <=2.2 LA volume, S                           58.7  ml     --------- LA volume/bsa, S                       26.9  ml/m^2 --------- LA volume, ES, 1-p A4C                 53.2  ml     --------- LA volume/bsa, ES, 1-p A4C             24.4  ml/m^2 --------- LA volume, ES, 1-p A2C                 61.8  ml     --------- LA volume/bsa, ES, 1-p A2C             28.3  ml/m^2 ---------  Mitral valve                           Value        Reference Mitral E-wave peak velocity            87.4  cm/s   --------- Mitral A-wave peak velocity            64.8  cm/s   --------- Mitral deceleration time       (H)     248   ms     150 - 230 Mitral peak gradient, D  3     mm Hg  --------- Mitral E/A ratio, peak                 1.3          ---------  Systemic veins                         Value        Reference Estimated CVP                          3     mm Hg  ---------  Right ventricle                        Value        Reference TAPSE                                  27.2  mm     --------- RV s&', lateral, S                      11.4  cm/s   ---------  Legend: (L)  and  (H)  mark values outside specified reference range.  ------------------------------------------------------------------- Prepared and Electronically Authenticated by  Zoila Shutter MD 2017-12-27T17:12:07   MONITORS  CARDIAC EVENT MONITOR 06/26/2016  Narrative  Normal sinus rhythm predominantly.  Brief episodes of paroxysmal Atrial Fibrillation and paroxysmal atrial flutter, both with rapid ventricular response- asymptomatic.  Consider anticoagulation based on stroke risk.  CT SCANS  CT CORONARY MORPH W/CTA COR W/SCORE 05/18/2019  Addendum 05/20/2019  4:59 PM ADDENDUM REPORT: 05/20/2019 16:56  HISTORY: Shortness of breath abnormal CT  EXAM: Cardiac/Coronary  CT  TECHNIQUE: The patient was scanned on a [employer].  PROTOCOL: A 120 kV prospective scan was triggered in the descending thoracic aorta at 111 HU's. Axial non-contrast 3 mm slices were carried out through the heart. The data set was analyzed on a dedicated work station and scored using the Agatson method. Gantry rotation speed was 250 msecs and collimation was 0.6 mm. Beta blockade and 0.8 mg of sl NTG was given. The 3D data set was reconstructed in 5% intervals of 35-75% of the R-R cycle. Diastolic phases were analyzed on a dedicated work station using MPR, MIP and VRT modes. The patient received 80mL OMNIPAQUE IOHEXOL 350 MG/ML SOLN of contrast.  FINDINGS: Coronary calcium score: The patient's coronary artery calcium score is 19, which places the patient in the 44 percentile.  Coronary arteries: Normal coronary origins.  Right dominance.  Right Coronary Artery: Large dominant vessel giving rise to PDA and PLB. Mid RCA with mixed plaque with 1-24% stenosis.  Left Main Coronary Artery: No significant plaque or stenosis.  Left Anterior Descending Coronary Artery: Small D1, small-medium D2, medium D3 branches. There is a small mixed plaque in the mid LAD with 1-24% stenosis.  Left Circumflex Artery: Nondominant vessel with small OM1. No significant plaque or stenosis.  Aorta: Normal size, 28 mm at the mid ascending aorta (level of the PA bifurcation) measured double oblique. Mild calcifications. No dissection.  Aortic Valve: No calcifications. Trileaflet.  Other findings:  Normal pulmonary vein drainage into the left atrium.  Normal left atrial appendage without a thrombus.  Normal size of the pulmonary artery.  IMPRESSION: 1.  Mild nonobstructive CAD, CADRADS =  1.  2. Coronary calcium score of 19. This was 44th percentile for age and sex matched control.  3. Normal coronary origin with right dominance.   Electronically Signed By: Jodelle Red M.D. On: 05/20/2019 16:56  Narrative EXAM: OVER-READ INTERPRETATION  CT CHEST  The following report is an over-read performed by radiologist Dr. Charlett Nose of [facility] on 05/18/2019. This over-read does not include interpretation of cardiac or coronary anatomy or pathology. The coronary CTA interpretation by the cardiologist is attached.  COMPARISON:  CT 09/08/2018  FINDINGS: Vascular: Heart is normal size.  Visualized aorta normal caliber.  Mediastinum/Nodes: No adenopathy in the lower mediastinum or hila.  Lungs/Pleura: 5 mm nodule in the inferior right upper lobe is stable since prior study. No confluent opacities or effusions.  Upper Abdomen: Imaging into the upper abdomen shows no acute findings.  Musculoskeletal: Chest wall soft tissues are unremarkable. No acute bony abnormality.  IMPRESSION: 5 mm inferior right upper lobe nodule, stable since prior study. No follow-up needed if patient is low-risk. Non-contrast chest CT can be considered in 12 months if patient is high-risk. This recommendation follows the consensus statement: Guidelines for Management of Incidental Pulmonary Nodules Detected on CT Images: From the Fleischner Society 2017; Radiology 2017; 284:228-243.  Electronically Signed: By: Charlett Nose M.D. On: 05/18/2019 16:34         Recent Labs: 09/04/2022: BUN 29; Creatinine, Ser 0.91; Hemoglobin 13.4; Platelets 267; Potassium 3.6; Sodium 133  Recent Lipid Panel No results found for: "CHOL", "TRIG", "HDL", "CHOLHDL", "VLDL", "LDLCALC", "LDLDIRECT"  History of Present Illness   77 year old female with a history of coronary artery calcifications, paroxysmal atrial fibrillation, hypertension, prediabetes, and OSA on CPAP.  Echocardiogram in 2017 was normal.  Coronary CT angiogram in 2020 showed coronary calcium score of 19 (44th percentile), mild nonobstructive CAD.  She has a history of paroxysmal atrial fibrillation on  chronic anticoagulation with Eliquis.  She was last in the office on 07/03/2023 and noted increased episodes of breakthrough atrial fibrillation.  She also noted fatigue with metoprolol.  Metoprolol and amlodipine were discontinued, and she was started on diltiazem 120 mg daily.  She contacted her office at 07/14/2023 and noted continued breakthrough atrial fibrillation.  She was advised to increase her diltiazem to 240 mg daily.   She presents today for follow-up.  Since her last visit she has been stable overall from a cardiac standpoint.  Over the past several weeks her episodes of breakthrough atrial fibrillation have significantly decreased.  She had 1 episode the other week that lasted for approximately 15 minutes.  When she is in A-fib she notices symptoms of fatigue, and mild chest tightness. She is tolerating increased diltiazem.    Home Medications    Current Outpatient Medications  Medication Sig Dispense Refill   acetaminophen (TYLENOL) 500 MG tablet Take 1,000 mg by mouth every 6 (six) hours as needed for mild pain.     apixaban (ELIQUIS) 5 MG TABS tablet Take 1 tablet (5 mg total) by mouth 2 (two) times daily. 180 tablet 3   citalopram (CELEXA) 20 MG tablet Take 20 mg by mouth every evening.      diltiazem (CARDIZEM CD) 120 MG 24 hr capsule Take 1 capsule (120 mg total) by mouth daily. 90 capsule 3   diltiazem (CARDIZEM) 30 MG tablet Take 1 tablet every 4 hours AS NEEDED for AFIB heart rate >100 as long as top BP >100. 30 tablet 1   Pitavastatin Magnesium (ZYPITAMAG)  2 MG TABS Take 2 mg by mouth daily in the afternoon.     Polyethyl Glycol-Propyl Glycol (SYSTANE OP) Apply 1 drop to eye 2 (two) times daily as needed (for dry eyes). Dry eyes     triamterene-hydrochlorothiazide (MAXZIDE-25) 37.5-25 MG tablet Take 2 tablets by mouth every morning.     No current facility-administered medications for this visit.     Review of Systems    She denies chest pain, dyspnea, pnd, orthopnea, n,  v, dizziness, syncope, edema, weight gain, or early satiety. All other systems reviewed and are otherwise negative except as noted above.   Physical Exam    VS:  BP (!) 140/66   Pulse 60   Ht 5\' 5"  (1.651 m)   Wt 215 lb 9.6 oz (97.8 kg)   SpO2 94%   BMI 35.88 kg/m  GEN: Well nourished, well developed, in no acute distress. HEENT: normal. Neck: Supple, no JVD, carotid bruits, or masses. Cardiac: RRR, no murmurs, rubs, or gallops. No clubbing, cyanosis, edema.  Radials/DP/PT 2+ and equal bilaterally.  Respiratory:  Respirations regular and unlabored, clear to auscultation bilaterally. GI: Soft, nontender, nondistended, BS + x 4. MS: no deformity or atrophy. Skin: warm and dry, no rash. Neuro:  Strength and sensation are intact. Psych: Normal affect.  Accessory Clinical Findings    ECG personally reviewed by me today - EKG Interpretation Date/Time:  Thursday August 06 2023 14:41:08 EST Ventricular Rate:  60 PR Interval:  170 QRS Duration:  88 QT Interval:  446 QTC Calculation: 446 R Axis:   33  Text Interpretation: Normal sinus rhythm Normal ECG When compared with ECG of 03-Jul-2023 08:16, No significant change was found Confirmed by Bernadene Person ([id number]) on 08/06/2023 2:46:08 PM  - no acute changes.   Lab Results  Component Value Date   WBC 13.6 (H) 09/04/2022   HGB 13.4 09/04/2022   HCT 40.5 09/04/2022   MCV 90.4 09/04/2022   PLT 267 09/04/2022   Lab Results  Component Value Date   CREATININE 0.91 09/04/2022   BUN 29 (H) 09/04/2022   NA 133 (L) 09/04/2022   K 3.6 09/04/2022   CL 97 (L) 09/04/2022   CO2 26 09/04/2022   Lab Results  Component Value Date   ALT 37 03/19/2018   AST 27 03/19/2018   ALKPHOS 65 03/19/2018   BILITOT 0.9 03/19/2018   No results found for: "CHOL", "HDL", "LDLCALC", "LDLDIRECT", "TRIG", "CHOLHDL"  Lab Results  Component Value Date   HGBA1C 5.8 (H) 07/22/2016    Assessment & Plan    1. Paroxysmal atrial fibrillation: Diltiazem  was recently increased to 240 mg daily.  Maintaining sinus rhythm.  Recent labs were unremarkable.  Should she have increased frequency or duration of breakthrough atrial fibrillation, consider need for cardiac monitor to assess A-fib burden.  She may benefit from follow-up in the A-fib clinic/EP referral.  Continue to monitor symptoms.  Continue diltiazem, Eliquis.  2. Coronary artery calcification: Coronary CT angiogram in 2020 showed coronary calcium score of 19 (44th percentile), mild nonobstructive CAD. Stable with no anginal symptoms. No indication for ischemic evaluation.  Continue triamterene-HCTZ.  3. Hypertension: BP well controlled. Continue current antihypertensive regimen.   4. Prediabetes: A1c was 7.1 in 01/2023.  Monitor managed per PCP.  5. OSA: Adherent to CPAP.  6. Disposition: Follow-up in 3-4 months with Dr. Jacques Navy.   Joylene Grapes, NP 08/06/2023, 5:23 PM

## 2023-09-01 DIAGNOSIS — M533 Sacrococcygeal disorders, not elsewhere classified: Secondary | ICD-10-CM | POA: Diagnosis not present

## 2023-09-08 DIAGNOSIS — M545 Low back pain, unspecified: Secondary | ICD-10-CM | POA: Diagnosis not present

## 2023-09-15 DIAGNOSIS — S46812A Strain of other muscles, fascia and tendons at shoulder and upper arm level, left arm, initial encounter: Secondary | ICD-10-CM | POA: Diagnosis not present

## 2023-09-15 DIAGNOSIS — M533 Sacrococcygeal disorders, not elsewhere classified: Secondary | ICD-10-CM | POA: Diagnosis not present

## 2023-09-15 DIAGNOSIS — M545 Low back pain, unspecified: Secondary | ICD-10-CM | POA: Diagnosis not present

## 2023-09-15 DIAGNOSIS — M25552 Pain in left hip: Secondary | ICD-10-CM | POA: Diagnosis not present

## 2023-09-21 ENCOUNTER — Other Ambulatory Visit: Payer: Self-pay | Admitting: Medical Genetics

## 2023-09-22 DIAGNOSIS — M545 Low back pain, unspecified: Secondary | ICD-10-CM | POA: Diagnosis not present

## 2023-09-24 ENCOUNTER — Other Ambulatory Visit (HOSPITAL_COMMUNITY)
Admission: RE | Admit: 2023-09-24 | Discharge: 2023-09-24 | Disposition: A | Discharge status: Home / Self Care | Payer: Self-pay | Source: Ambulatory Visit | Attending: Oncology | Admitting: Oncology

## 2023-09-28 DIAGNOSIS — M542 Cervicalgia: Secondary | ICD-10-CM | POA: Diagnosis not present

## 2023-10-05 ENCOUNTER — Telehealth: Payer: Self-pay | Admitting: Internal Medicine

## 2023-10-05 DIAGNOSIS — M545 Low back pain, unspecified: Secondary | ICD-10-CM | POA: Diagnosis not present

## 2023-10-05 LAB — GENECONNECT MOLECULAR SCREEN: Genetic Analysis Overall Interpretation: NEGATIVE

## 2023-10-05 NOTE — Telephone Encounter (Signed)
 Pt c/o medication issue:  1. Name of Medication: diltiazem  (CARDIZEM  CD) 240 MG 24 hr capsule   2. How are you currently taking this medication (dosage and times per day)?    Take 1 capsule (240 mg total) by mouth daily.    3. Are you having a reaction (difficulty breathing--STAT)? no  4. What is your medication issue? Patient was taking the medication incorrectly . Instead of taking 1 pill at 240mg  she was taking 2. Thinking it was the 120mg , now patient is out of medication. Calling to see what needs to be done. Please advise

## 2023-10-05 NOTE — Telephone Encounter (Signed)
 Called and spoke with patient who states I didn't realize I was taking a double dose of the medication the whole time.  She also states I have had any afib but sometimes I do have a mild headache and notice that my bottom number of my blood pressure is a little low.  Informed her that I had sent her message to Dr Chancy Comber already but I would also send this to Marlana Silvan as she states that is the last person who seen her in the office. She realizes that she wont hear back from anyone tonight.

## 2023-10-07 ENCOUNTER — Telehealth: Payer: Self-pay | Admitting: Internal Medicine

## 2023-10-07 DIAGNOSIS — I480 Paroxysmal atrial fibrillation: Secondary | ICD-10-CM

## 2023-10-07 NOTE — Telephone Encounter (Signed)
Pt c/o medication issue:  1. Name of Medication:   diltiazem (CARDIZEM CD) 240 MG 24 hr capsule   2. How are you currently taking this medication (dosage and times per day)?   3. Are you having a reaction (difficulty breathing--STAT)?   4. What is your medication issue?    Patient called again to get advice on dosage for this medication and next steps.  See note dated 10/05/23.

## 2023-10-08 MED ORDER — DILTIAZEM HCL ER COATED BEADS 240 MG PO CP24: 240.0000 mg | ORAL_CAPSULE | Freq: Every day | ORAL | 3 refills | Status: AC

## 2023-10-08 NOTE — Telephone Encounter (Signed)
Parke Poisson, MD to Joylene Grapes, NP  Me     10/08/23 12:07 AM No danger from previously accidentally doubling her dose of diltiazem as long as she is feeling ok now. Please refill for patient now at correct dose, which seems to be 240 mg daily.  Spoke with pt's husband, Onalee Hua (ok per [facility]), patient identification verified by 2 forms. Dr. Lupe Carney response given to Onalee Hua. New prescription for Cardizem CD 240mg  sent to preferred pharmacy. David verbalizes understanding.

## 2023-10-08 NOTE — Telephone Encounter (Signed)
Husband Onalee Hua) called to report patient's pharmacy [id number] - [place] ([place]), [place] - [address]) will not fill patient's prescription until March.  Husband stated patient is now out of this medication.  Husband wants a call back to confirm prescription re-sent to pharmacy.

## 2023-10-12 ENCOUNTER — Other Ambulatory Visit: Payer: Self-pay | Admitting: Obstetrics and Gynecology

## 2023-10-12 DIAGNOSIS — Z1231 Encounter for screening mammogram for malignant neoplasm of breast: Secondary | ICD-10-CM

## 2023-10-19 DIAGNOSIS — M5030 Other cervical disc degeneration, unspecified cervical region: Secondary | ICD-10-CM | POA: Diagnosis not present

## 2023-10-19 DIAGNOSIS — M542 Cervicalgia: Secondary | ICD-10-CM | POA: Diagnosis not present

## 2023-10-19 DIAGNOSIS — M5412 Radiculopathy, cervical region: Secondary | ICD-10-CM | POA: Diagnosis not present

## 2023-10-22 ENCOUNTER — Ambulatory Visit
Admission: RE | Admit: 2023-10-22 | Discharge: 2023-10-22 | Disposition: A | Discharge status: Home / Self Care | Payer: Medicare Other | Source: Ambulatory Visit | Attending: Obstetrics and Gynecology | Admitting: Obstetrics and Gynecology

## 2023-10-22 DIAGNOSIS — Z1231 Encounter for screening mammogram for malignant neoplasm of breast: Secondary | ICD-10-CM

## 2023-11-09 DIAGNOSIS — L821 Other seborrheic keratosis: Secondary | ICD-10-CM | POA: Diagnosis not present

## 2023-11-09 DIAGNOSIS — Z85828 Personal history of other malignant neoplasm of skin: Secondary | ICD-10-CM | POA: Diagnosis not present

## 2023-11-09 DIAGNOSIS — L814 Other melanin hyperpigmentation: Secondary | ICD-10-CM | POA: Diagnosis not present

## 2023-11-09 DIAGNOSIS — L570 Actinic keratosis: Secondary | ICD-10-CM | POA: Diagnosis not present

## 2023-11-09 DIAGNOSIS — L308 Other specified dermatitis: Secondary | ICD-10-CM | POA: Diagnosis not present

## 2023-11-09 DIAGNOSIS — D3617 Benign neoplasm of peripheral nerves and autonomic nervous system of trunk, unspecified: Secondary | ICD-10-CM | POA: Diagnosis not present

## 2023-11-09 DIAGNOSIS — L820 Inflamed seborrheic keratosis: Secondary | ICD-10-CM | POA: Diagnosis not present

## 2023-11-17 ENCOUNTER — Ambulatory Visit: Payer: Medicare Other | Attending: Internal Medicine | Admitting: Internal Medicine

## 2023-11-17 VITALS — BP 136/62 | HR 59 | Ht 65.0 in | Wt 216.0 lb

## 2023-11-17 DIAGNOSIS — I480 Paroxysmal atrial fibrillation: Secondary | ICD-10-CM

## 2023-11-17 DIAGNOSIS — E119 Type 2 diabetes mellitus without complications: Secondary | ICD-10-CM | POA: Diagnosis not present

## 2023-11-17 DIAGNOSIS — I872 Venous insufficiency (chronic) (peripheral): Secondary | ICD-10-CM | POA: Diagnosis not present

## 2023-11-17 DIAGNOSIS — E782 Mixed hyperlipidemia: Secondary | ICD-10-CM | POA: Diagnosis not present

## 2023-11-17 DIAGNOSIS — G4733 Obstructive sleep apnea (adult) (pediatric): Secondary | ICD-10-CM | POA: Diagnosis not present

## 2023-11-17 DIAGNOSIS — I10 Essential (primary) hypertension: Secondary | ICD-10-CM | POA: Diagnosis not present

## 2023-11-17 NOTE — Patient Instructions (Signed)
 Medication Instructions:  No Changes *If you need a refill on your cardiac medications before your next appointment, please call your pharmacy*  Lab Work: None  Follow-Up: At [facility], you and your health needs are our priority.  As part of our continuing mission to provide you with exceptional heart care, we have created designated Provider Care Teams.  These Care Teams include your primary Cardiologist (physician) and Advanced Practice Providers (APPs -  Physician Assistants and Nurse Practitioners) who all work together to provide you with the care you need, when you need it.   Your next appointment:   1 year(s)  Provider:   Parke Poisson, MD    Other Instructions Please call us or send [place] a MyChart message with any Cardiology related concerns.  [phone].  Thank you!

## 2023-11-17 NOTE — Progress Notes (Signed)
 Cardiology Office Note:  .   Date:  11/17/2023  ID:  Shannon Aguilar, DOB 1946/05/05, MRN [id number] PCP: Tally Joe, MD  [facility] Providers Cardiologist:  Parke Poisson, MD    History of Present Illness: .   Shannon Aguilar is a 78 y.o. female.  Discussed the use of AI scribe software for clinical note transcription with the patient, who gave verbal consent to proceed.  History of Present Illness   The patient, with a history of atrial fibrillation, hypertension, and hyperlipidemia, presents for a routine follow-up visit. She reports feeling generally well, with her AFib under control on her current medication regimen of diltiazem and apixaban. The patient notes that her AFib episodes have decreased in frequency and severity since starting diltiazem. She also mentions a recurrent rash that appears every winter and resolves in the summer, which she has discussed with a dermatologist. The patient's primary concern is fatigue, which she attributes to her AFib and possibly her sleep apnea. She reports using her CPAP machine regularly but expresses some doubts about its effectiveness. The patient also mentions taking hydrochlorothiazide/triamterene and pivastatin for her hypertension and hyperlipidemia, respectively, without any adverse effects.        ROS: negative except per HPI above.  Studies Reviewed: .        Results   LABS Hemoglobin A1c: 6.1 (07/2023)  RADIOLOGY Coronary CT scan: Mild nonobstructive CAD, calcium score 19 (2020)  DIAGNOSTIC Echocardiogram: Normal ejection fraction, atria overall normal (2017)     Risk Assessment/Calculations:        Physical Exam:   VS:  BP 136/62   Pulse (!) 59   Ht 5\' 5"  (1.651 m)   Wt 216 lb (98 kg)   SpO2 96%   BMI 35.94 kg/m    Wt Readings from Last 3 Encounters:  11/17/23 216 lb (98 kg)  08/06/23 215 lb 9.6 oz (97.8 kg)  07/03/23 221 lb 6.4 oz (100.4 kg)     Physical Exam   GENERAL: Alert, cooperative,  well developed, no acute distress. HEENT: Normocephalic, normal oropharynx, moist mucous membranes. CHEST: Clear to auscultation bilaterally, no wheezes, rhonchi, or crackles. CARDIOVASCULAR: Normal heart rate and rhythm, S1 and S2 normal without murmurs. ABDOMEN: Soft, non-tender, non-distended, normal bowel sounds. EXTREMITIES: No cyanosis or edema. NEUROLOGICAL: Cranial nerves grossly intact, moves all extremities without gross motor or sensory deficit.       ASSESSMENT AND PLAN: .     Assessment and Plan    Atrial Fibrillation Intermittent atrial fibrillation with 10-17% burden per patient. Minimal symptoms, stable heart structure from 2017 echocardiogram. Diltiazem controls heart rate. - Continue diltiazem 240 mg daily. - Monitor blood pressure during episodes. - Consider repeat echocardiogram if burden increases or symptoms worsen. - Use as-needed diltiazem for significant heart rate elevation. Diltiazem 30 mg po PRN.  Hypertension Blood pressure well-controlled with current medication regimen. - Continue diltiazem 240 mg daily and hydrochlorothiazide-triamterene 37.5 mg -25 mg 2 tablets daily.  - Monitor blood pressure regularly.  Hyperlipidemia Cholesterol levels well-controlled with pivastatin 2 mg daily, no side effects. - Continue pivastatin 2 mg daily.  Type 2 Diabetes Mellitus Diabetes well-controlled, recent hemoglobin A1c 6.1. - Continue current diabetes management.  Venous Insufficiency Recurrent seasonal rash on left leg, likely venous insufficiency. Dermatologist recommended treatment. - Use prescribed skin cream for rash. - Keep skin moisturized with thick emollient creams. - Consider compression socks if rash persists.  Obstructive Sleep Apnea Regular CPAP use, occasional machine issues.  Possible link to fatigue and atrial fibrillation. - Consult with sleep specialist to evaluate CPAP machine and treatment efficacy.

## 2023-11-26 DIAGNOSIS — E1165 Type 2 diabetes mellitus with hyperglycemia: Secondary | ICD-10-CM | POA: Diagnosis not present

## 2023-11-30 DIAGNOSIS — E782 Mixed hyperlipidemia: Secondary | ICD-10-CM | POA: Diagnosis not present

## 2023-11-30 DIAGNOSIS — G4730 Sleep apnea, unspecified: Secondary | ICD-10-CM | POA: Diagnosis not present

## 2023-11-30 DIAGNOSIS — R053 Chronic cough: Secondary | ICD-10-CM | POA: Diagnosis not present

## 2023-11-30 DIAGNOSIS — I10 Essential (primary) hypertension: Secondary | ICD-10-CM | POA: Diagnosis not present

## 2023-11-30 DIAGNOSIS — N1831 Chronic kidney disease, stage 3a: Secondary | ICD-10-CM | POA: Diagnosis not present

## 2023-11-30 DIAGNOSIS — J45909 Unspecified asthma, uncomplicated: Secondary | ICD-10-CM | POA: Diagnosis not present

## 2023-11-30 DIAGNOSIS — M5030 Other cervical disc degeneration, unspecified cervical region: Secondary | ICD-10-CM | POA: Diagnosis not present

## 2023-11-30 DIAGNOSIS — Z85828 Personal history of other malignant neoplasm of skin: Secondary | ICD-10-CM | POA: Diagnosis not present

## 2023-11-30 DIAGNOSIS — I480 Paroxysmal atrial fibrillation: Secondary | ICD-10-CM | POA: Diagnosis not present

## 2023-11-30 DIAGNOSIS — E1169 Type 2 diabetes mellitus with other specified complication: Secondary | ICD-10-CM | POA: Diagnosis not present

## 2023-11-30 DIAGNOSIS — Z87898 Personal history of other specified conditions: Secondary | ICD-10-CM | POA: Diagnosis not present

## 2023-12-03 DIAGNOSIS — I480 Paroxysmal atrial fibrillation: Secondary | ICD-10-CM | POA: Diagnosis not present

## 2023-12-03 DIAGNOSIS — N1831 Chronic kidney disease, stage 3a: Secondary | ICD-10-CM | POA: Diagnosis not present

## 2023-12-03 DIAGNOSIS — I10 Essential (primary) hypertension: Secondary | ICD-10-CM | POA: Diagnosis not present

## 2023-12-03 DIAGNOSIS — E7800 Pure hypercholesterolemia, unspecified: Secondary | ICD-10-CM | POA: Diagnosis not present

## 2023-12-03 DIAGNOSIS — G4733 Obstructive sleep apnea (adult) (pediatric): Secondary | ICD-10-CM | POA: Diagnosis not present

## 2023-12-03 DIAGNOSIS — E1165 Type 2 diabetes mellitus with hyperglycemia: Secondary | ICD-10-CM | POA: Diagnosis not present

## 2023-12-03 DIAGNOSIS — B351 Tinea unguium: Secondary | ICD-10-CM | POA: Diagnosis not present

## 2023-12-14 DIAGNOSIS — G4733 Obstructive sleep apnea (adult) (pediatric): Secondary | ICD-10-CM | POA: Diagnosis not present

## 2023-12-23 ENCOUNTER — Encounter: Payer: Self-pay | Admitting: Podiatrist

## 2023-12-23 ENCOUNTER — Ambulatory Visit: Admitting: Podiatrist

## 2023-12-23 DIAGNOSIS — L608 Other nail disorders: Secondary | ICD-10-CM | POA: Diagnosis not present

## 2023-12-23 DIAGNOSIS — L603 Nail dystrophy: Secondary | ICD-10-CM | POA: Diagnosis not present

## 2023-12-23 DIAGNOSIS — B351 Tinea unguium: Secondary | ICD-10-CM | POA: Diagnosis not present

## 2023-12-23 NOTE — Progress Notes (Signed)
 Chief Complaint  Patient presents with   Nail Problem    Toe nail fungus on right foot great toe. Using topical medication for it. Has not used in 1 week.     HPI: Patient is 78 y.o. female who presents today for discoloration distal lateral aspect of the right great toenail.  She was prescribed ciclopirox topical and relates her insurance required a culture.  She presents today for a evaluation and culture of the right hallux nail.  Patient Active Problem List   Diagnosis Date Noted   Type 2 diabetes mellitus without complication, without long-term current use of insulin (HCC) 06/16/2023   Elevated coronary artery calcium score 10/21/2022   Rapid atrial fibrillation (HCC) 09/05/2022   COPD (chronic obstructive pulmonary disease) (HCC) 09/20/2019   Solitary pulmonary nodule 10/22/2017   Chronic cough 06/25/2017   Obstructive sleep apnea 06/25/2017   PAF (paroxysmal atrial fibrillation) (HCC) 07/30/2016   Current use of long term anticoagulation 07/07/2016   HTN (hypertension) 07/07/2016   Hyperlipidemia 07/07/2016   Obesity 07/07/2016   OA (osteoarthritis) of knee 11/19/2015   Acute medial meniscal tear 03/03/2012    Current Outpatient Medications on File Prior to Visit  Medication Sig Dispense Refill   acetaminophen  (TYLENOL ) 500 MG tablet Take 1,000 mg by mouth every 6 (six) hours as needed for mild pain.     apixaban  (ELIQUIS ) 5 MG TABS tablet Take 1 tablet (5 mg total) by mouth 2 (two) times daily. 180 tablet 3   citalopram  (CELEXA ) 20 MG tablet Take 20 mg by mouth every evening.      diltiazem  (CARDIZEM  CD) 240 MG 24 hr capsule Take 1 capsule (240 mg total) by mouth daily. 90 capsule 3   diltiazem  (CARDIZEM ) 30 MG tablet Take 1 tablet every 4 hours AS NEEDED for AFIB heart rate >100 as long as top BP >100. 30 tablet 1   Pitavastatin Magnesium (ZYPITAMAG) 2 MG TABS Take 2 mg by mouth daily in the afternoon.     Polyethyl Glycol-Propyl Glycol (SYSTANE OP) Apply 1 drop to eye 2  (two) times daily as needed (for dry eyes). Dry eyes     tirzepatide (MOUNJARO) 2.5 MG/0.5ML Pen Inject 2.5 mg into the skin once a week.     triamterene -hydrochlorothiazide  (MAXZIDE -25) 37.5-25 MG tablet Take 2 tablets by mouth every morning.     No current facility-administered medications on file prior to visit.    Allergies  Allergen Reactions   Milk-Related Compounds Diarrhea   Penicillins Hives and Rash    ++ got ancef  in 2013++ Has patient had a PCN reaction causing immediate rash, facial/tongue/throat swelling, SOB or lightheadedness with hypotension: unk Has patient had a PCN reaction causing severe rash involving mucus membranes or skin necrosis: no Has patient had a PCN reaction that required hospitalization: no Has patient had a PCN reaction occurring within the last 10 years: no If all of the above answers are "NO", then may proceed with Cephalosporin use.   Atorvastatin Other (See Comments)   Pravastatin  Other (See Comments)    Review of Systems No fevers, chills, nausea, muscle aches, no difficulty breathing, no calf pain, no chest pain or shortness of breath.   Physical Exam  GENERAL APPEARANCE: Alert, conversant. Appropriately groomed. No acute distress.   VASCULAR: Pedal pulses palpable 2/4 DP and 2/4 PT bilateral.  Capillary refill time is immediate to all digits,  Proximal to distal cooling is warm to warm.  Digital perfusion adequate.   NEUROLOGIC: sensation is intact  to 5.07 monofilament at 5/5 sites bilateral.  Light touch is intact bilateral, vibratory sensation intact bilateral  MUSCULOSKELETAL: acceptable muscle strength, tone and stability bilateral.  No gross boney pedal deformities noted.  No pain, crepitus or limitation noted with foot and ankle range of motion bilateral.   DERMATOLOGIC: skin is warm, supple, and dry.  All nails are normal with the exception of the right hallux nail distal lateral portion of the nail has a yellowish discoloration and  subungual debris present.  Mycotic appearance of the nail is noted clinically.    Assessment   Onychomycosis   Plan  Took a sample of the toenail to send a culture for evaluation of fungal elements.  Will call when the results are back.  Also will call in a prescription for ciclopirox if indicated.

## 2023-12-31 ENCOUNTER — Other Ambulatory Visit: Payer: Self-pay | Admitting: Podiatrist

## 2024-01-06 ENCOUNTER — Ambulatory Visit: Payer: Self-pay | Admitting: Podiatrist

## 2024-01-25 ENCOUNTER — Other Ambulatory Visit: Payer: Medicare Other

## 2024-03-29 DIAGNOSIS — G4733 Obstructive sleep apnea (adult) (pediatric): Secondary | ICD-10-CM | POA: Diagnosis not present

## 2024-05-11 DIAGNOSIS — Z961 Presence of intraocular lens: Secondary | ICD-10-CM | POA: Diagnosis not present

## 2024-05-11 DIAGNOSIS — E119 Type 2 diabetes mellitus without complications: Secondary | ICD-10-CM | POA: Diagnosis not present

## 2024-05-13 DIAGNOSIS — Z85828 Personal history of other malignant neoplasm of skin: Secondary | ICD-10-CM | POA: Diagnosis not present

## 2024-05-13 DIAGNOSIS — L720 Epidermal cyst: Secondary | ICD-10-CM | POA: Diagnosis not present

## 2024-05-13 DIAGNOSIS — D485 Neoplasm of uncertain behavior of skin: Secondary | ICD-10-CM | POA: Diagnosis not present

## 2024-05-13 DIAGNOSIS — B078 Other viral warts: Secondary | ICD-10-CM | POA: Diagnosis not present

## 2024-05-13 DIAGNOSIS — L821 Other seborrheic keratosis: Secondary | ICD-10-CM | POA: Diagnosis not present

## 2024-05-30 DIAGNOSIS — E1165 Type 2 diabetes mellitus with hyperglycemia: Secondary | ICD-10-CM | POA: Diagnosis not present

## 2024-05-30 DIAGNOSIS — E7800 Pure hypercholesterolemia, unspecified: Secondary | ICD-10-CM | POA: Diagnosis not present

## 2024-06-02 DIAGNOSIS — E782 Mixed hyperlipidemia: Secondary | ICD-10-CM | POA: Diagnosis not present

## 2024-06-02 DIAGNOSIS — I480 Paroxysmal atrial fibrillation: Secondary | ICD-10-CM | POA: Diagnosis not present

## 2024-06-02 DIAGNOSIS — I10 Essential (primary) hypertension: Secondary | ICD-10-CM | POA: Diagnosis not present

## 2024-06-02 DIAGNOSIS — N1831 Chronic kidney disease, stage 3a: Secondary | ICD-10-CM | POA: Diagnosis not present

## 2024-06-02 DIAGNOSIS — M5030 Other cervical disc degeneration, unspecified cervical region: Secondary | ICD-10-CM | POA: Diagnosis not present

## 2024-06-02 DIAGNOSIS — Z23 Encounter for immunization: Secondary | ICD-10-CM | POA: Diagnosis not present

## 2024-06-02 DIAGNOSIS — Z0000 Encounter for general adult medical examination without abnormal findings: Secondary | ICD-10-CM | POA: Diagnosis not present

## 2024-06-02 DIAGNOSIS — Z87898 Personal history of other specified conditions: Secondary | ICD-10-CM | POA: Diagnosis not present

## 2024-06-02 DIAGNOSIS — E1122 Type 2 diabetes mellitus with diabetic chronic kidney disease: Secondary | ICD-10-CM | POA: Diagnosis not present

## 2024-06-02 DIAGNOSIS — R053 Chronic cough: Secondary | ICD-10-CM | POA: Diagnosis not present

## 2024-06-06 DIAGNOSIS — E7800 Pure hypercholesterolemia, unspecified: Secondary | ICD-10-CM | POA: Diagnosis not present

## 2024-06-06 DIAGNOSIS — I10 Essential (primary) hypertension: Secondary | ICD-10-CM | POA: Diagnosis not present

## 2024-06-06 DIAGNOSIS — N1831 Chronic kidney disease, stage 3a: Secondary | ICD-10-CM | POA: Diagnosis not present

## 2024-06-06 DIAGNOSIS — E1165 Type 2 diabetes mellitus with hyperglycemia: Secondary | ICD-10-CM | POA: Diagnosis not present

## 2024-06-06 DIAGNOSIS — I480 Paroxysmal atrial fibrillation: Secondary | ICD-10-CM | POA: Diagnosis not present

## 2024-06-06 DIAGNOSIS — G4733 Obstructive sleep apnea (adult) (pediatric): Secondary | ICD-10-CM | POA: Diagnosis not present

## 2024-08-18 ENCOUNTER — Other Ambulatory Visit: Payer: Self-pay | Admitting: Internal Medicine

## 2024-08-18 DIAGNOSIS — I480 Paroxysmal atrial fibrillation: Secondary | ICD-10-CM

## 2024-08-24 ENCOUNTER — Telehealth: Payer: Self-pay | Admitting: Internal Medicine

## 2024-08-24 NOTE — Telephone Encounter (Signed)
" °*  STAT* If patient is at the pharmacy, call can be transferred to refill team.   1. Which medications need to be refilled? (please list name of each medication and dose if known)   apixaban  (ELIQUIS ) 5 MG TABS tablet     2. Would you like to learn more about the convenience, safety, & potential cost savings by using the [facility]? No   3. Are you open to using the [facility] (Type [facility].) No   4. Which pharmacy/location (including street and city if local pharmacy) is medication to be sent to?  [id number] - [place] ([place]), [place] - [address]   5. Do they need a 30 day or 90 day supply? 90 day  Pt is out of medication  "

## 2024-08-26 ENCOUNTER — Other Ambulatory Visit: Payer: Self-pay | Admitting: Internal Medicine

## 2024-08-26 DIAGNOSIS — I480 Paroxysmal atrial fibrillation: Secondary | ICD-10-CM

## 2024-08-26 MED ORDER — APIXABAN 5 MG PO TABS: 5.0000 mg | ORAL_TABLET | Freq: Two times a day (BID) | ORAL | 1 refills | Status: AC

## 2024-08-26 NOTE — Telephone Encounter (Signed)
 Prescription refill request for Eliquis  received. Indication: a fib Last office visit: 11/17/23 Scr: 1.07 [facility] 05/30/24 Age: 79 Weight: 98 kg

## 2024-08-30 NOTE — Telephone Encounter (Signed)
This was done 12/26

## 2024-09-21 ENCOUNTER — Other Ambulatory Visit: Payer: Self-pay | Admitting: Family Medicine

## 2024-09-21 DIAGNOSIS — Z1231 Encounter for screening mammogram for malignant neoplasm of breast: Secondary | ICD-10-CM

## 2024-10-21 ENCOUNTER — Encounter

## 2024-10-21 DIAGNOSIS — Z1231 Encounter for screening mammogram for malignant neoplasm of breast: Secondary | ICD-10-CM

## 2024-10-24 ENCOUNTER — Ambulatory Visit

## 2024-11-21 ENCOUNTER — Ambulatory Visit: Admitting: Internal Medicine
